# Patient Record
Sex: Male | Born: 1935 | Race: Black or African American | Hispanic: No | Marital: Married | Smoking: Former smoker
Health system: Southern US, Community
[De-identification: ages and names within clinical notes are randomized; demographics above are authoritative.]

## PROBLEM LIST (undated history)

## (undated) DIAGNOSIS — J45909 Unspecified asthma, uncomplicated: Secondary | ICD-10-CM

## (undated) DIAGNOSIS — M7541 Impingement syndrome of right shoulder: Secondary | ICD-10-CM

## (undated) DIAGNOSIS — M1990 Unspecified osteoarthritis, unspecified site: Secondary | ICD-10-CM

## (undated) DIAGNOSIS — E7800 Pure hypercholesterolemia, unspecified: Secondary | ICD-10-CM

## (undated) DIAGNOSIS — N400 Enlarged prostate without lower urinary tract symptoms: Secondary | ICD-10-CM

## (undated) DIAGNOSIS — K56609 Unspecified intestinal obstruction, unspecified as to partial versus complete obstruction: Secondary | ICD-10-CM

## (undated) DIAGNOSIS — G4733 Obstructive sleep apnea (adult) (pediatric): Secondary | ICD-10-CM

## (undated) DIAGNOSIS — J449 Chronic obstructive pulmonary disease, unspecified: Secondary | ICD-10-CM

## (undated) HISTORY — PX: PROSTATE BIOPSY: SHX241

## (undated) HISTORY — PX: COLONOSCOPY: SHX174

## (undated) HISTORY — PX: JOINT REPLACEMENT: SHX530

---

## 2005-08-15 ENCOUNTER — Ambulatory Visit: Payer: Self-pay | Admitting: Internal Medicine

## 2007-12-09 ENCOUNTER — Ambulatory Visit: Payer: Self-pay | Admitting: Internal Medicine

## 2009-03-19 ENCOUNTER — Inpatient Hospital Stay: Payer: Self-pay | Admitting: *Deleted

## 2010-01-30 ENCOUNTER — Ambulatory Visit: Payer: Self-pay | Admitting: Internal Medicine

## 2010-02-16 ENCOUNTER — Ambulatory Visit: Payer: Self-pay | Admitting: Internal Medicine

## 2010-08-13 ENCOUNTER — Ambulatory Visit: Payer: Self-pay | Admitting: Specialist

## 2010-10-20 DIAGNOSIS — Z9109 Other allergy status, other than to drugs and biological substances: Secondary | ICD-10-CM | POA: Insufficient documentation

## 2011-01-05 ENCOUNTER — Inpatient Hospital Stay: Payer: Self-pay | Admitting: Internal Medicine

## 2011-05-31 DIAGNOSIS — J449 Chronic obstructive pulmonary disease, unspecified: Secondary | ICD-10-CM | POA: Insufficient documentation

## 2011-06-12 ENCOUNTER — Emergency Department: Payer: Self-pay | Admitting: Unknown Physician Specialty

## 2011-06-12 LAB — COMPREHENSIVE METABOLIC PANEL
Albumin: 4.2 g/dL (ref 3.4–5.0)
Anion Gap: 8 (ref 7–16)
Calcium, Total: 9.4 mg/dL (ref 8.5–10.1)
Chloride: 105 mmol/L (ref 98–107)
Co2: 28 mmol/L (ref 21–32)
Creatinine: 0.86 mg/dL (ref 0.60–1.30)
EGFR (African American): >60
EGFR (Non-African Amer.): >60
Glucose: 112 mg/dL — ABNORMAL HIGH (ref 65–99)
Potassium: 4.2 mmol/L (ref 3.5–5.1)
Sodium: 141 mmol/L (ref 136–145)
Total Protein: 7.9 g/dL (ref 6.4–8.2)

## 2011-06-12 LAB — CBC
HCT: 48.6 % (ref 40.0–52.0)
HGB: 16.1 g/dL (ref 13.0–18.0)
MCV: 93 fL (ref 80–100)
Platelet: 223 10*3/uL (ref 150–440)
RDW: 13.3 % (ref 11.5–14.5)
WBC: 9.4 10*3/uL (ref 3.8–10.6)

## 2011-10-16 DIAGNOSIS — R972 Elevated prostate specific antigen [PSA]: Secondary | ICD-10-CM | POA: Insufficient documentation

## 2011-10-16 DIAGNOSIS — R350 Frequency of micturition: Secondary | ICD-10-CM | POA: Insufficient documentation

## 2011-10-16 DIAGNOSIS — R339 Retention of urine, unspecified: Secondary | ICD-10-CM | POA: Insufficient documentation

## 2011-10-16 DIAGNOSIS — N401 Enlarged prostate with lower urinary tract symptoms: Secondary | ICD-10-CM | POA: Insufficient documentation

## 2012-03-03 DIAGNOSIS — G4730 Sleep apnea, unspecified: Secondary | ICD-10-CM | POA: Insufficient documentation

## 2012-03-16 ENCOUNTER — Ambulatory Visit: Payer: Self-pay | Admitting: Unknown Physician Specialty

## 2012-03-18 LAB — PATHOLOGY REPORT

## 2012-07-14 ENCOUNTER — Encounter: Payer: Self-pay | Admitting: Specialist

## 2012-07-28 ENCOUNTER — Encounter: Payer: Self-pay | Admitting: Specialist

## 2013-07-29 LAB — COMPREHENSIVE METABOLIC PANEL
Albumin: 3.8 g/dL (ref 3.4–5.0)
Alkaline Phosphatase: 69 U/L
Anion Gap: 6 — ABNORMAL LOW (ref 7–16)
BUN: 17 mg/dL (ref 7–18)
Bilirubin,Total: 0.7 mg/dL (ref 0.2–1.0)
CO2: 27 mmol/L (ref 21–32)
CREATININE: 1.04 mg/dL (ref 0.60–1.30)
Calcium, Total: 9.3 mg/dL (ref 8.5–10.1)
Chloride: 105 mmol/L (ref 98–107)
EGFR (African American): >60
Glucose: 149 mg/dL — ABNORMAL HIGH (ref 65–99)
Osmolality: 280 (ref 275–301)
Potassium: 3.9 mmol/L (ref 3.5–5.1)
SGOT(AST): 28 U/L (ref 15–37)
SGPT (ALT): 28 U/L (ref 12–78)
Sodium: 138 mmol/L (ref 136–145)
Total Protein: 7.2 g/dL (ref 6.4–8.2)

## 2013-07-29 LAB — CBC
HCT: 49 % (ref 40.0–52.0)
HGB: 16.5 g/dL (ref 13.0–18.0)
MCH: 31.4 pg (ref 26.0–34.0)
MCHC: 33.6 g/dL (ref 32.0–36.0)
MCV: 94 fL (ref 80–100)
Platelet: 173 10*3/uL (ref 150–440)
RBC: 5.24 10*6/uL (ref 4.40–5.90)
RDW: 13.3 % (ref 11.5–14.5)
WBC: 12.7 10*3/uL — ABNORMAL HIGH (ref 3.8–10.6)

## 2013-07-29 LAB — TROPONIN I

## 2013-07-29 LAB — PRO B NATRIURETIC PEPTIDE: B-Type Natriuretic Peptide: 31 pg/mL (ref 0–450)

## 2013-07-30 ENCOUNTER — Inpatient Hospital Stay: Payer: Self-pay | Admitting: Internal Medicine

## 2013-07-31 LAB — CBC WITH DIFFERENTIAL/PLATELET
BASOS PCT: 0 %
Basophil #: 0 10*3/uL (ref 0.0–0.1)
EOS PCT: 0 %
Eosinophil #: 0 10*3/uL (ref 0.0–0.7)
HCT: 44.6 % (ref 40.0–52.0)
HGB: 14.4 g/dL (ref 13.0–18.0)
LYMPHS ABS: 0.8 10*3/uL — AB (ref 1.0–3.6)
Lymphocyte %: 4.6 %
MCH: 30.5 pg (ref 26.0–34.0)
MCHC: 32.4 g/dL (ref 32.0–36.0)
MCV: 94 fL (ref 80–100)
Monocyte #: 0.8 x10 3/mm (ref 0.2–1.0)
Monocyte %: 4.2 %
NEUTROS ABS: 16.5 10*3/uL — AB (ref 1.4–6.5)
NEUTROS PCT: 91.2 %
Platelet: 196 10*3/uL (ref 150–440)
RBC: 4.74 10*6/uL (ref 4.40–5.90)
RDW: 13.3 % (ref 11.5–14.5)
WBC: 18.1 10*3/uL — AB (ref 3.8–10.6)

## 2013-07-31 LAB — LIPID PANEL
Cholesterol: 140 mg/dL (ref 0–200)
HDL: 59 mg/dL (ref 40–60)
LDL CHOLESTEROL, CALC: 67 mg/dL (ref 0–100)
TRIGLYCERIDES: 71 mg/dL (ref 0–200)
VLDL CHOLESTEROL, CALC: 14 mg/dL (ref 5–40)

## 2013-07-31 LAB — BASIC METABOLIC PANEL
Anion Gap: 7 (ref 7–16)
BUN: 18 mg/dL (ref 7–18)
CHLORIDE: 106 mmol/L (ref 98–107)
Calcium, Total: 9.1 mg/dL (ref 8.5–10.1)
Co2: 25 mmol/L (ref 21–32)
Creatinine: 0.98 mg/dL (ref 0.60–1.30)
EGFR (African American): >60
EGFR (Non-African Amer.): >60
Glucose: 149 mg/dL — ABNORMAL HIGH (ref 65–99)
OSMOLALITY: 280 (ref 275–301)
Potassium: 4.4 mmol/L (ref 3.5–5.1)
Sodium: 138 mmol/L (ref 136–145)

## 2013-08-02 ENCOUNTER — Emergency Department: Payer: Self-pay | Admitting: Emergency Medicine

## 2013-08-02 LAB — CBC
HCT: 47.8 % (ref 40.0–52.0)
HGB: 15.3 g/dL (ref 13.0–18.0)
MCH: 30.1 pg (ref 26.0–34.0)
MCHC: 32.1 g/dL (ref 32.0–36.0)
MCV: 94 fL (ref 80–100)
PLATELETS: 213 10*3/uL (ref 150–440)
RBC: 5.1 10*6/uL (ref 4.40–5.90)
RDW: 13 % (ref 11.5–14.5)
WBC: 12.8 10*3/uL — ABNORMAL HIGH (ref 3.8–10.6)

## 2013-08-02 LAB — BASIC METABOLIC PANEL
ANION GAP: 8 (ref 7–16)
BUN: 19 mg/dL — ABNORMAL HIGH (ref 7–18)
CREATININE: 1.08 mg/dL (ref 0.60–1.30)
Calcium, Total: 8.7 mg/dL (ref 8.5–10.1)
Chloride: 100 mmol/L (ref 98–107)
Co2: 27 mmol/L (ref 21–32)
GLUCOSE: 123 mg/dL — AB (ref 65–99)
Osmolality: 274 (ref 275–301)
Potassium: 4 mmol/L (ref 3.5–5.1)
SODIUM: 135 mmol/L — AB (ref 136–145)

## 2013-08-02 LAB — HEPATIC FUNCTION PANEL A (ARMC)
ALBUMIN: 3.7 g/dL (ref 3.4–5.0)
Alkaline Phosphatase: 58 U/L
BILIRUBIN TOTAL: 0.6 mg/dL (ref 0.2–1.0)
Bilirubin, Direct: 0.1 mg/dL (ref 0.00–0.20)
SGOT(AST): 32 U/L (ref 15–37)
SGPT (ALT): 39 U/L (ref 12–78)
TOTAL PROTEIN: 7.1 g/dL (ref 6.4–8.2)

## 2013-08-02 LAB — TROPONIN I: Troponin-I: <0.02 ng/mL

## 2013-08-02 LAB — PRO B NATRIURETIC PEPTIDE: B-Type Natriuretic Peptide: 691 pg/mL — ABNORMAL HIGH (ref 0–450)

## 2014-03-30 DIAGNOSIS — E7800 Pure hypercholesterolemia, unspecified: Secondary | ICD-10-CM | POA: Insufficient documentation

## 2014-03-30 DIAGNOSIS — N401 Enlarged prostate with lower urinary tract symptoms: Secondary | ICD-10-CM | POA: Insufficient documentation

## 2014-04-18 ENCOUNTER — Emergency Department: Payer: Self-pay | Admitting: Emergency Medicine

## 2014-04-29 ENCOUNTER — Ambulatory Visit: Payer: Self-pay

## 2014-05-13 DIAGNOSIS — M7541 Impingement syndrome of right shoulder: Secondary | ICD-10-CM | POA: Insufficient documentation

## 2014-05-21 NOTE — H&P (Signed)
PATIENT NAME:  Kyle Wells, [name] MR#:  [id number] DATE OF BIRTH:  10/09/1933  DATE OF ADMISSION:  07/30/2013  PRIMARY CARE PHYSICIAN:  Dr. Yates DecampJohn Walker.   REFERRING PHYSICIAN:  Dr. Margarita GrizzleWoodruff.   CHIEF COMPLAINT:  Shortness of breath.   HISTORY OF PRESENT ILLNESS:  The patient is a 81 year old male with a past medical history of COPD, obstructive sleep apnea, lives on CPAP, still smokes cigarettes, is presenting to the ED with a chief complaint of two day history of shortness of breath.  The patient tried nebulizer treatments at home with no significant improvement.  He was wheezing and could not breathe tonight and came into the ED.  The patient was given by mouth prednisone and levofloxacin and some nebulizer treatments with no significant improvement.  Hospitalist team is called to admit the patient.  Solu-Medrol 125 mg was ordered and during my examination the patient's shortness of breath is slightly better and resting.  His daughter and son were at bedside.  Denies any fevers.  No sick contacts.  No other complaints.  He is complaining of some dry cough, nothing unusual.   PAST MEDICAL HISTORY:  Chronic history of COPD, hyperlipidemia, morbid obesity, tobacco use, obstructive sleep apnea, uses CPAP at bedtime.   PAST SURGICAL HISTORY:  None.   ALLERGIES:  No known drug allergies.   PSYCHOSOCIAL HISTORY:  Lives with wife.  Smokes one to two cigarettes per day.  Denies alcohol or illicit drug usage.  FAMILY HISTORY:  Father deceased at age 79 with heart attack.   HOME MEDICATIONS:  Tylenol extra strength 2 tablets every six hours as needed, Spiriva 18 mcg inhalation once daily, ProAir 2 puff inhalation every four hours as needed, Flonase one spray each nostril once daily, fenestrate 5 mg once daily, Advair 500/50 inhalation 2 times a day.   REVIEW OF SYSTEMS:  CONSTITUTIONAL:  Denies any fever, fatigue.  EYES:  Denies blurry vision, double vision.  EARS, NOSE, THROAT:  Denies epistaxis,  discharge or tinnitus.  RESPIRATION:  Intermittent episodes of cough, complaining of wheezing and shortness of breath.  Denies any pneumonia or asthma.  Has chronic history of COPD.  CARDIOVASCULAR:  No chest pain, palpitations, syncope.  GASTROINTESTINAL:  Denies nausea, vomiting, diarrhea, abdominal pain, hematemesis, or melena.  GENITOURINARY:  No dysuria, hematuria.   ENDOCRINE:  Denies polyuria, nocturia, thyroid problems.  He has chronic hyperlipidemia.  HEMATOLOGIC AND LYMPHATIC:  No anemia, easy bruising, bleeding.  INTEGUMENTARY:  No acne, rash, lesions.  MUSCULOSKELETAL:  No joint pain in the neck and back.  Denies any shoulder pain.  Denies any gout.  NEUROLOGIC:  Denies vertigo, ataxia, dementia.  PSYCHIATRIC:  No ADD, OCD.  Denies any bipolar disorder.   PHYSICAL EXAMINATION:  VITAL SIGNS:  Temperature afebrile, pulse 76, respirations 22, blood pressure 136/82, pulse ox is 97% on 2 liters.  GENERAL APPEARANCE:  Not under acute distress.  Moderately built and nourished.  HEENT:  Normocephalic, atraumatic.  Pupils are equally reacting to light and accommodation.  No scleral icterus.  No conjunctival injection.  No sinus tenderness.  No postnasal drip.  Moist mucous membranes.  NECK:  Supple.  No JVD.  No thyromegaly.  Range of motion is intact.  LUNGS:  Moderate air entry.  End expiratory wheezing is present.  No accessory muscle usage.  CARDIAC:  S1 and S2 normal.  Regular rate and rhythm.  No anterior chest wall tenderness on palpation.  No peripheral edema.  GASTROINTESTINAL:  Soft.  Bowel sounds  are positive in all four quadrants.  Nontender, nondistended.  No hepatosplenomegaly.  No masses felt.  NEUROLOGIC:  Awake, alert, and oriented x 3.  Cranial nerves II through XII are grossly intact.  Motor and sensory are intact.  Reflexes are 2+.  EXTREMITIES:  No edema.  No cyanosis.  No clubbing.  SKIN:  Warm to touch.  Normal turgor.  No rashes.  No lesions.  MUSCULOSKELETAL:  No  joint effusion, tenderness, erythema.  PSYCHIATRIC:  Normal mood and affect.   LABORATORY AND IMAGING STUDIES:  LFTs are normal.  Troponin less than 0.02.  CBC:  WBC is 12.7.  The rest of the CBC is normal.  BMP:  Glucose is at 149, anion gap is at 6.  The rest of the BMP is normal.  Chest x-ray, portable:  No evidence of acute cardiopulmonary disease.  A 12-lead EKG, normal sinus rhythm at 78 beats per minute.  No acute ST-T wave changes.   ASSESSMENT AND PLAN:  A 81 year old male presenting to the Emergency Department with a chief complaint of shortness of breath for two days associated with wheezing and some dry cough, will be admitted with the following assessment and plan.  1.  Acute respiratory distress from acute exacerbation of chronic obstructive pulmonary disease.  We will admit him to telemetry.  We will provide him intravenous Solu-Medrol 60 mg q. 6 hours, nebulizer treatments and intravenous levofloxacin.  2.  Nicotine dependence.  The patient is encouraged to quit smoking.  3.  Obstructive sleep apnea.  CPAP at bedtime.  4.  Hyperlipidemia.  Continue his home medications.  5.  Benign prostatic hypertrophy.  Continue finasteride.  6.  Provide gastrointestinal and deep vein thrombosis prophylaxis.   Plan of care discussed with the patient and his son and daughter at bedside.  They all verbalized understanding of the plan.   CODE STATUS:  HE IS A FULL CODE.  Daughter is the medical power of attorney.    The patient will be transferred to Dr. Yates Decamp in the a.m.   Total time spent on the admission is 50 minutes.    ____________________________ Ramonita Lab, MD ag:ea D: 07/30/2013 00:38:01 ET T: 07/30/2013 01:50:20 ET JOB#: [id number]  cc: Ramonita Lab, MD, <Dictator> Ramonita Lab MD ELECTRONICALLY SIGNED 07/31/2013 0:42

## 2014-09-18 ENCOUNTER — Emergency Department: Payer: Medicare Other

## 2014-09-18 ENCOUNTER — Encounter: Payer: Self-pay | Admitting: Emergency Medicine

## 2014-09-18 ENCOUNTER — Inpatient Hospital Stay
Admission: EM | Admit: 2014-09-18 | Discharge: 2014-09-21 | DRG: 390 | Disposition: A | Discharge status: Home / Self Care | Payer: Medicare Other | Attending: Surgery | Admitting: Surgery

## 2014-09-18 DIAGNOSIS — F17210 Nicotine dependence, cigarettes, uncomplicated: Secondary | ICD-10-CM | POA: Diagnosis present

## 2014-09-18 DIAGNOSIS — J45909 Unspecified asthma, uncomplicated: Secondary | ICD-10-CM | POA: Diagnosis present

## 2014-09-18 DIAGNOSIS — K5660 Unspecified intestinal obstruction: Secondary | ICD-10-CM | POA: Diagnosis not present

## 2014-09-18 DIAGNOSIS — K567 Ileus, unspecified: Secondary | ICD-10-CM | POA: Diagnosis present

## 2014-09-18 DIAGNOSIS — K56609 Unspecified intestinal obstruction, unspecified as to partial versus complete obstruction: Secondary | ICD-10-CM | POA: Insufficient documentation

## 2014-09-18 DIAGNOSIS — M1990 Unspecified osteoarthritis, unspecified site: Secondary | ICD-10-CM | POA: Diagnosis present

## 2014-09-18 DIAGNOSIS — J449 Chronic obstructive pulmonary disease, unspecified: Secondary | ICD-10-CM | POA: Insufficient documentation

## 2014-09-18 DIAGNOSIS — K5730 Diverticulosis of large intestine without perforation or abscess without bleeding: Secondary | ICD-10-CM | POA: Diagnosis present

## 2014-09-18 HISTORY — DX: Chronic obstructive pulmonary disease, unspecified: J44.9

## 2014-09-18 HISTORY — DX: Unspecified osteoarthritis, unspecified site: M19.90

## 2014-09-18 HISTORY — DX: Unspecified asthma, uncomplicated: J45.909

## 2014-09-18 LAB — CBC
HCT: 47.9 % (ref 40.0–52.0)
Hemoglobin: 15.9 g/dL (ref 13.0–18.0)
MCH: 30.1 pg (ref 26.0–34.0)
MCHC: 33.1 g/dL (ref 32.0–36.0)
MCV: 90.9 fL (ref 80.0–100.0)
PLATELETS: 203 10*3/uL (ref 150–440)
RBC: 5.27 MIL/uL (ref 4.40–5.90)
RDW: 13 % (ref 11.5–14.5)
WBC: 11.7 10*3/uL — AB (ref 3.8–10.6)

## 2014-09-18 LAB — COMPREHENSIVE METABOLIC PANEL
ALBUMIN: 3.9 g/dL (ref 3.5–5.0)
ALT: 19 U/L (ref 17–63)
AST: 31 U/L (ref 15–41)
Alkaline Phosphatase: 63 U/L (ref 38–126)
Anion gap: 9 (ref 5–15)
BUN: 17 mg/dL (ref 6–20)
CHLORIDE: 104 mmol/L (ref 101–111)
CO2: 25 mmol/L (ref 22–32)
Calcium: 9.3 mg/dL (ref 8.9–10.3)
Creatinine, Ser: 0.87 mg/dL (ref 0.61–1.24)
GFR calc Af Amer: >60 mL/min (ref >60)
GFR calc non Af Amer: >60 mL/min (ref >60)
GLUCOSE: 144 mg/dL — AB (ref 65–99)
POTASSIUM: 4 mmol/L (ref 3.5–5.1)
SODIUM: 138 mmol/L (ref 135–145)
Total Bilirubin: 0.5 mg/dL (ref 0.3–1.2)
Total Protein: 7.1 g/dL (ref 6.5–8.1)

## 2014-09-18 LAB — BRAIN NATRIURETIC PEPTIDE: B Natriuretic Peptide: 63 pg/mL (ref 0.0–100.0)

## 2014-09-18 LAB — LIPASE, BLOOD: LIPASE: 15 U/L — AB (ref 22–51)

## 2014-09-18 LAB — TROPONIN I

## 2014-09-18 MED ORDER — PREDNISONE 20 MG PO TABS
40.0000 mg | ORAL_TABLET | Freq: Once | ORAL | Status: AC
  Administered 2014-09-18: 40 mg via ORAL
  Filled 2014-09-18: qty 2

## 2014-09-18 MED ORDER — IOHEXOL 240 MG/ML SOLN
25.0000 mL | Freq: Once | INTRAMUSCULAR | Status: AC | PRN
  Administered 2014-09-18: 25 mL via ORAL

## 2014-09-18 MED ORDER — IPRATROPIUM-ALBUTEROL 0.5-2.5 (3) MG/3ML IN SOLN
3.0000 mL | Freq: Once | RESPIRATORY_TRACT | Status: AC
  Administered 2014-09-18: 3 mL via RESPIRATORY_TRACT
  Filled 2014-09-18: qty 3

## 2014-09-18 MED ORDER — IOHEXOL 350 MG/ML SOLN
100.0000 mL | Freq: Once | INTRAVENOUS | Status: AC | PRN
  Administered 2014-09-18: 100 mL via INTRAVENOUS

## 2014-09-18 NOTE — ED Notes (Addendum)
Pt c/o "upset stomach" since Friday; denies N/V/D; normal BM today; shortness of breath started today; history of COPD but says he feels more SOB than normal for him; pt denies cough/cold symptoms; pt wears oxygen but only at night with CPAP when he's sleeping; talking in complete coherent' pt says he uses his home nebulizer every 4-5 hours but says he didn't use it today because he didn't feel like it; then says he did use it maybe once or twice; pt's oxygen 90% in triage; usually it's 93-95% on room air at home

## 2014-09-18 NOTE — ED Notes (Signed)
Pt states abdominal pain started after Friday. Unable to eat but just a few small meals since due to abdominal pain says it feels like things are "rolling around."

## 2014-09-18 NOTE — ED Notes (Signed)
CT notified patient finished drinking contrast. 

## 2014-09-18 NOTE — ED Notes (Signed)
Patient transported to X-ray 

## 2014-09-18 NOTE — ED Provider Notes (Signed)
[facility] Emergency Department Provider Note  ____________________________________________  Time seen: Approximately 8:22 PM  I have reviewed the triage vital signs and the nursing notes.   HISTORY  Chief Complaint Shortness of Breath and Abdominal Pain    HPI Kyle Wells is a 81 y.o. male reports that since about Friday he's been feeling a little "full" and bloated in his stomach. Still eating, not throwing up. No diarrhea. Still having normal bowel movements. No fevers or chills. In addition today he states that he started to feel just a little short of breath. States that this feels kind of typical with his COPD but slightly worse than normal with a little bit of wheezing.  He denies having a productive cough. States he has a nebulizer at home, used it once today and it helped a little bit. Comes ER for evaluation of his mild shortness of breath.  He reports that he has not had any weight gain or swelling in his legs. Denies a history of heart failure "fluid on lungs". I have a chest pain.   Past Medical History  Diagnosis Date  . COPD (chronic obstructive pulmonary disease)   . Arthritis   . Asthma     There are no active problems to display for this patient.   History reviewed. No pertinent past surgical history.  Current Outpatient Rx  Name  Route  Sig  Dispense  Refill  . ADVAIR DISKUS 500-50 MCG/DOSE AEPB   Inhalation   Inhale 2 puffs into the lungs daily.           Dispense as written.   Marland Kitchen albuterol (PROVENTIL) (2.5 MG/3ML) 0.083% nebulizer solution   Inhalation   Inhale 3 mLs into the lungs every 6 (six) hours.         . finasteride (PROSCAR) 5 MG tablet   Oral   Take 1 tablet by mouth daily.      3   . montelukast (SINGULAIR) 10 MG tablet   Oral   Take 1 tablet by mouth daily.         . Multiple Vitamins-Minerals (MULTIVITAMIN & MINERAL PO)   Oral   Take 1 tablet by mouth daily.         Marland Kitchen PROAIR HFA 108 (90  BASE) MCG/ACT inhaler   Inhalation   Inhale 2 puffs into the lungs 2 (two) times daily.           Dispense as written.   Marland Kitchen SPIRIVA HANDIHALER 18 MCG inhalation capsule   Inhalation   Place 2 puffs into inhaler and inhale daily.           Dispense as written.     Allergies Review of patient's allergies indicates no known allergies.  History reviewed. No pertinent family history.  Social History Social History  Substance Use Topics  . Smoking status: Current Some Day Smoker    Types: Cigarettes  . Smokeless tobacco: Never Used  . Alcohol Use: No    Review of Systems Constitutional: No fever/chills Eyes: No visual changes. ENT: No sore throat. Cardiovascular: Denies chest pain. Respiratory: See history of present illness Gastrointestinal: Feeling of fullness, but no pain.  No nausea, no vomiting.  No diarrhea.  No constipation. Genitourinary: Negative for dysuria. Musculoskeletal: Negative for back pain. Skin: Negative for rash. Neurological: Negative for headaches, focal weakness or numbness.  10-point ROS otherwise negative.  ____________________________________________   PHYSICAL EXAM:  VITAL SIGNS: ED Triage Vitals  Enc Vitals Group  BP 09/18/14 2005 114/72 mmHg     Pulse Rate 09/18/14 2005 103     Resp 09/18/14 2005 26     Temp 09/18/14 2005 98.1 F (36.7 C)     Temp Source 09/18/14 2005 Oral     SpO2 09/18/14 2005 90 %     Weight 09/18/14 2005 210 lb (95.255 kg)     Height 09/18/14 2005 5\' 7"  (1.702 m)     Head Cir --      Peak Flow --      Pain Score 09/18/14 2008 0     Pain Loc --      Pain Edu? --      Excl. in GC? --     Constitutional: Alert and oriented. Well appearing and in no acute distress. Patient's oxygen saturation noted at 93% on room air. He states that normally his oxygen level somewhere between 92-95% at home. Presently states he is feeling okay. Eyes: Conjunctivae are normal. PERRL. EOMI. Head: Atraumatic. Nose: No  congestion/rhinnorhea. Mouth/Throat: Mucous membranes are moist.  Oropharynx non-erythematous. Neck: No stridor.   Cardiovascular: Normal rate, regular rhythm. Grossly normal heart sounds.  Good peripheral circulation. Respiratory: Normal respiratory effort.  No retractions. Lungs CTAB supportive and a very minimal amount of wheezing and expiratory. Slightly diminished in the bases bilaterally. Gastrointestinal: Soft and nontender. No distention. No abdominal bruits. No CVA tenderness. Musculoskeletal: No lower extremity tenderness nor edema.  No joint effusions. Neurologic:  Normal speech and language. No gross focal neurologic deficits are appreciated. No gait instability. Skin:  Skin is warm, dry and intact. No rash noted. Psychiatric: Mood and affect are normal. Speech and behavior are normal.  ____________________________________________   LABS (all labs ordered are listed, but only abnormal results are displayed)  Labs Reviewed  CBC - Abnormal; Notable for the following:    WBC 11.7 (*)    All other components within normal limits  COMPREHENSIVE METABOLIC PANEL - Abnormal; Notable for the following:    Glucose, Bld 144 (*)    All other components within normal limits  LIPASE, BLOOD - Abnormal; Notable for the following:    Lipase 15 (*)    All other components within normal limits  TROPONIN I  BRAIN NATRIURETIC PEPTIDE   ____________________________________________  EKG  Reviewed and interpreted by me Sinus tachycardia Ventricular rate 100 PR 158 QRS 88 QTc 435 No acute ST abnormalities. No ischemic changes. ____________________________________________  RADIOLOGY  IMPRESSION: Changes of small bowel obstruction with transition zone in the right lower quadrant. Possible internal hernia versus adhesions. No evidence of bowel ischemia. Prostate gland is enlarged. ____________________________________________   PROCEDURES  Procedure(s) performed: None  Critical  Care performed: No  ____________________________________________   INITIAL IMPRESSION / ASSESSMENT AND PLAN / ED COURSE  Pertinent labs & imaging results that were available during my care of the patient were reviewed by me and considered in my medical decision making (see chart for details).  Patient presents with mild dyspnea and feeling of being bloated. He appears in no distress, and his oxygen level is currently at his reported baseline. He does have long-standing COPD, but given his slight increase in shortness of breath I suspect he may be having a mild COPD exacerbation. His EKG is quite reassuring without signs of ischemia. Diarrhea chest pain. His abdomen is soft nontender and nondistended to my evaluation. There is no JVD, there is no lower extremity edema. No evidence of congestive heart failure by exam. We'll obtain x-rays of his  abdomen and chest, I'll give him a nebulizer treatment, we'll obtain basic labs including cardiac enzymes given his age and risk factors. All also had a BNP.  ----------------------------------------- 12:06 AM on 09/19/2014 -----------------------------------------  Discussed with Dr. Juliann Pulse, he will admit the patient. Advises to place NG tube. Discussed with the patient, patient agreeable with NG tube placement. We'll admit him for ongoing care of the general surgery service. ____________________________________________   FINAL CLINICAL IMPRESSION(S) / ED DIAGNOSES  Final diagnoses:  Small bowel obstruction  COPD, mild      Sharyn Creamer, MD 09/19/14 [phone]

## 2014-09-19 ENCOUNTER — Emergency Department: Payer: Medicare Other

## 2014-09-19 ENCOUNTER — Inpatient Hospital Stay: Payer: Medicare Other

## 2014-09-19 DIAGNOSIS — K5660 Unspecified intestinal obstruction: Secondary | ICD-10-CM | POA: Diagnosis present

## 2014-09-19 DIAGNOSIS — K5730 Diverticulosis of large intestine without perforation or abscess without bleeding: Secondary | ICD-10-CM | POA: Diagnosis present

## 2014-09-19 DIAGNOSIS — J45909 Unspecified asthma, uncomplicated: Secondary | ICD-10-CM | POA: Diagnosis present

## 2014-09-19 DIAGNOSIS — F17210 Nicotine dependence, cigarettes, uncomplicated: Secondary | ICD-10-CM | POA: Diagnosis present

## 2014-09-19 DIAGNOSIS — K567 Ileus, unspecified: Secondary | ICD-10-CM | POA: Diagnosis present

## 2014-09-19 DIAGNOSIS — M1990 Unspecified osteoarthritis, unspecified site: Secondary | ICD-10-CM | POA: Diagnosis present

## 2014-09-19 DIAGNOSIS — J449 Chronic obstructive pulmonary disease, unspecified: Secondary | ICD-10-CM | POA: Diagnosis present

## 2014-09-19 DIAGNOSIS — K5669 Other intestinal obstruction: Secondary | ICD-10-CM | POA: Diagnosis not present

## 2014-09-19 LAB — CBC
HEMATOCRIT: 45.1 % (ref 40.0–52.0)
HEMOGLOBIN: 15.4 g/dL (ref 13.0–18.0)
MCH: 31.1 pg (ref 26.0–34.0)
MCHC: 34.1 g/dL (ref 32.0–36.0)
MCV: 91.3 fL (ref 80.0–100.0)
Platelets: 198 10*3/uL (ref 150–440)
RBC: 4.94 MIL/uL (ref 4.40–5.90)
RDW: 12.8 % (ref 11.5–14.5)
WBC: 10 10*3/uL (ref 3.8–10.6)

## 2014-09-19 LAB — COMPREHENSIVE METABOLIC PANEL
ALBUMIN: 3.7 g/dL (ref 3.5–5.0)
ALK PHOS: 59 U/L (ref 38–126)
ALT: 19 U/L (ref 17–63)
ANION GAP: 8 (ref 5–15)
AST: 24 U/L (ref 15–41)
BUN: 16 mg/dL (ref 6–20)
CALCIUM: 8.9 mg/dL (ref 8.9–10.3)
CO2: 26 mmol/L (ref 22–32)
Chloride: 104 mmol/L (ref 101–111)
Creatinine, Ser: 0.89 mg/dL (ref 0.61–1.24)
GFR calc Af Amer: >60 mL/min (ref >60)
GFR calc non Af Amer: >60 mL/min (ref >60)
GLUCOSE: 150 mg/dL — AB (ref 65–99)
POTASSIUM: 4.1 mmol/L (ref 3.5–5.1)
SODIUM: 138 mmol/L (ref 135–145)
Total Bilirubin: 0.8 mg/dL (ref 0.3–1.2)
Total Protein: 6.9 g/dL (ref 6.5–8.1)

## 2014-09-19 MED ORDER — MOMETASONE FURO-FORMOTEROL FUM 200-5 MCG/ACT IN AERO
2.0000 | INHALATION_SPRAY | Freq: Two times a day (BID) | RESPIRATORY_TRACT | Status: DC
  Administered 2014-09-19 – 2014-09-20 (×4): 2 via RESPIRATORY_TRACT
  Filled 2014-09-19: qty 8.8

## 2014-09-19 MED ORDER — ACETAMINOPHEN 650 MG RE SUPP
650.0000 mg | Freq: Four times a day (QID) | RECTAL | Status: DC | PRN
  Administered 2014-09-19: 650 mg via RECTAL
  Filled 2014-09-19: qty 1

## 2014-09-19 MED ORDER — TIOTROPIUM BROMIDE MONOHYDRATE 18 MCG IN CAPS
18.0000 ug | ORAL_CAPSULE | Freq: Every day | RESPIRATORY_TRACT | Status: DC
  Administered 2014-09-19 – 2014-09-20 (×2): 18 ug via RESPIRATORY_TRACT
  Filled 2014-09-19: qty 5

## 2014-09-19 MED ORDER — SODIUM CHLORIDE 0.9 % IV SOLN
INTRAVENOUS | Status: DC
  Administered 2014-09-19: 1000 mL via INTRAVENOUS
  Administered 2014-09-19 (×2): via INTRAVENOUS
  Administered 2014-09-20: 1000 mL via INTRAVENOUS

## 2014-09-19 MED ORDER — BUTAMBEN-TETRACAINE-BENZOCAINE 2-2-14 % EX AERO
INHALATION_SPRAY | CUTANEOUS | Status: AC
  Administered 2014-09-19: 1 via TOPICAL
  Filled 2014-09-19: qty 20

## 2014-09-19 MED ORDER — ALBUTEROL SULFATE (2.5 MG/3ML) 0.083% IN NEBU
3.0000 mL | INHALATION_SOLUTION | Freq: Four times a day (QID) | RESPIRATORY_TRACT | Status: DC
  Administered 2014-09-19 – 2014-09-20 (×5): 3 mL via RESPIRATORY_TRACT
  Filled 2014-09-19 (×5): qty 3

## 2014-09-19 MED ORDER — ONDANSETRON HCL 4 MG/2ML IJ SOLN: 4.0000 mg | Freq: Four times a day (QID) | INTRAMUSCULAR | Status: DC | PRN

## 2014-09-19 MED ORDER — HEPARIN SODIUM (PORCINE) 5000 UNIT/ML IJ SOLN
5000.0000 [IU] | Freq: Three times a day (TID) | INTRAMUSCULAR | Status: DC
  Administered 2014-09-19 – 2014-09-21 (×7): 5000 [IU] via SUBCUTANEOUS
  Filled 2014-09-19 (×7): qty 1

## 2014-09-19 MED ORDER — ONDANSETRON 4 MG PO TBDP: 4.0000 mg | ORAL_TABLET | Freq: Four times a day (QID) | ORAL | Status: DC | PRN

## 2014-09-19 MED ORDER — FINASTERIDE 5 MG PO TABS
5.0000 mg | ORAL_TABLET | Freq: Every day | ORAL | Status: DC
  Administered 2014-09-19 – 2014-09-20 (×2): 5 mg via ORAL
  Filled 2014-09-19 (×2): qty 1

## 2014-09-19 MED ORDER — BUTAMBEN-TETRACAINE-BENZOCAINE 2-2-14 % EX AERO
1.0000 | INHALATION_SPRAY | Freq: Once | CUTANEOUS | Status: AC
  Administered 2014-09-19: 1 via TOPICAL

## 2014-09-19 MED ORDER — PANTOPRAZOLE SODIUM 40 MG IV SOLR
40.0000 mg | Freq: Every day | INTRAVENOUS | Status: DC
  Administered 2014-09-19 – 2014-09-20 (×2): 40 mg via INTRAVENOUS
  Filled 2014-09-19 (×2): qty 40

## 2014-09-19 MED ORDER — ACETAMINOPHEN 325 MG PO TABS: 650.0000 mg | ORAL_TABLET | Freq: Four times a day (QID) | ORAL | Status: DC | PRN

## 2014-09-19 MED ORDER — HYDROMORPHONE HCL 1 MG/ML IJ SOLN
0.5000 mg | INTRAMUSCULAR | Status: DC | PRN
  Administered 2014-09-20: 0.5 mg via INTRAVENOUS
  Filled 2014-09-19: qty 1

## 2014-09-19 MED ORDER — ALBUTEROL SULFATE HFA 108 (90 BASE) MCG/ACT IN AERS: 2.0000 | INHALATION_SPRAY | Freq: Two times a day (BID) | RESPIRATORY_TRACT | Status: DC

## 2014-09-19 NOTE — H&P (Signed)
General Surgery History and Physical  CC: Abdominal bloating  HPI: Mr. Kyle Wells is a pleasant 81 yo M with a history of COPD who presents with approx 2 days of increased bloating.  Actually felt lesser bloating symptoms for weeks.  No nausea/vomiting. Last BM while in ER.  + chills.  No chest pain, shortness of breath, cough, abdominal pain, dysuria/hematuria.  Active Ambulatory Problems    Diagnosis Date Noted  . No Active Ambulatory Problems   Resolved Ambulatory Problems    Diagnosis Date Noted  . No Resolved Ambulatory Problems   Past Medical History  Diagnosis Date  . COPD (chronic obstructive pulmonary disease)   . Arthritis   . Asthma   History reviewed. No pertinent past surgical history.     Medication List    ASK your doctor about these medications        ADVAIR DISKUS 500-50 MCG/DOSE Aepb  Generic drug:  Fluticasone-Salmeterol  Inhale 2 puffs into the lungs daily.     finasteride 5 MG tablet  Commonly known as:  PROSCAR  Take 1 tablet by mouth daily.     montelukast 10 MG tablet  Commonly known as:  SINGULAIR  Take 1 tablet by mouth daily.     MULTIVITAMIN & MINERAL PO  Take 1 tablet by mouth daily.     albuterol (2.5 MG/3ML) 0.083% nebulizer solution  Commonly known as:  PROVENTIL  Inhale 3 mLs into the lungs every 6 (six) hours.     PROAIR HFA 108 (90 BASE) MCG/ACT inhaler  Generic drug:  albuterol  Inhale 2 puffs into the lungs 2 (two) times daily.     SPIRIVA HANDIHALER 18 MCG inhalation capsule  Generic drug:  tiotropium  Place 2 puffs into inhaler and inhale daily.        No Known Allergies   Social History   Social History  . Marital Status: Married    Spouse Name: N/A  . Number of Children: N/A  . Years of Education: N/A   Occupational History  . Not on file.   Social History Main Topics  . Smoking status: Current Some Day Smoker    Types: Cigarettes  . Smokeless tobacco: Never Used  . Alcohol Use: No  . Drug Use: No  .  Sexual Activity: Not on file   Other Topics Concern  . Not on file   Social History Narrative  . No narrative on file   History reviewed. No pertinent family history.   ROS: Full ROS obtained, pertinent positives and negatives as above  Blood pressure 131/75, pulse 82, temperature 98.1 F (36.7 C), temperature source Oral, resp. rate 18, height  (1.702 m), weight 210 lb (95.255 kg), SpO2 93 %. GEN: NAD/A&Ox3 FACE: no obvious facial trauma, normal external nose, normal external ears EYES: no scleral icterus, no conjunctivitis HEAD: normocephalic atraumatic CV: RRR, no MRG RESP: moving air well, lungs clear ABD: soft, nontender, moderately distended EXT: moving all ext well, strength 5/5 NEURO: cnII-XII grossly intact, sensation intact all 4 ext  Labs: All labs reviewed, significant for  WBC 11.7  Imaging: CT personally reviewed  Mid small bowel with dilation and proximally with contrast.  Normal Stomach, normal proximal and distal small bowel.  Distal small bowel without contrast.  A/P 81 yo who presents with abdominal distention.  CT shows dilated mid small bowel with proximal and distal collapse.  Strange presentation for SBO and has no history of prior surgery.  ? Internal hernia.  VSS, no  pain, normal labs.  Treat as SBO with NG decompression, IVF, KUB in am to eval contrast progression.

## 2014-09-19 NOTE — Progress Notes (Signed)
CC: Small bowel obstruction Subjective: This a patient admitted with a diagnosis of small bowel obstruction he has not had any previous surgeries but has had previous episodes like this. He states today he feels much better he has no abdominal pain no nausea or vomiting and nasogastric tube is been placed he is not passing any gas or bowel movement yet denies fevers or chills.  Objective: Vital signs in last 24 hours: Temp:  [97.6 F (36.4 C)-98.3 F (36.8 C)] 97.6 F (36.4 C) (08/22 0750) Pulse Rate:  [70-103] 74 (08/22 0750) Resp:  [16-26] 16 (08/22 0750) BP: (107-131)/(72-99) 107/83 mmHg (08/22 0750) SpO2:  [90 %-95 %] 95 % (08/22 0836) Weight:  [210 lb (95.255 kg)] 210 lb (95.255 kg) (08/21 2005) Last BM Date: 09/18/14  Intake/Output from previous day: 08/21 0701 - 08/22 0700 In: 216 [I.V.:216] Out: 500 [Urine:100; Emesis/NG output:400] Intake/Output this shift:    Physical exam:  Abdomen is soft distended minimally tympanitic nontender no scars are noted. A gastric tube is present. Calves are nontender. She is awake alert and oriented. Shows no jaundice.  Lab Results: CBC   Recent Labs  09/18/14 2054 09/19/14 0619  WBC 11.7* 10.0  HGB 15.9 15.4  HCT 47.9 45.1  PLT 203 198   BMET  Recent Labs  09/18/14 2054 09/19/14 0619  NA 138 138  K 4.0 4.1  CL 104 104  CO2 25 26  GLUCOSE 144* 150*  BUN 17 16  CREATININE 0.87 0.89  CALCIUM 9.3 8.9   PT/INR No results for input(s): LABPROT, INR in the last 72 hours. ABG No results for input(s): PHART, HCO3 in the last 72 hours.  Invalid input(s): PCO2, PO2  Studies/Results: Dg Chest 2 View  09/18/2014   CLINICAL DATA:  Shortness of breath beginning 09/17/2014.  EXAM: CHEST  2 VIEW  COMPARISON:  CT chest and PA and lateral chest 08/02/2013.  FINDINGS: There is mild cardiomegaly without edema. The chronic, mild scar atelectasis in the left lower lobe is noted. The right lung is clear. No pneumothorax or pleural  effusion. No focal bony abnormality.  IMPRESSION: No acute disease.   Electronically Signed   By: Drusilla Kanner M.D.   On: 09/18/2014 20:55   Dg Abd 1 View  09/19/2014   CLINICAL DATA:  NG tube placement.  EXAM: ABDOMEN - 1 VIEW  COMPARISON:  09/18/2014  FINDINGS: Enteric tube is been placed with tip in the left upper quadrant consistent with location in the upper stomach. Persisting gaseous distention of small bowel consistent with small bowel obstruction. Residual contrast material in the renal collecting systems.  IMPRESSION: Enteric tube tip in the left upper quadrant consistent with location in the upper stomach. Persistent small bowel distention consistent with obstruction.   Electronically Signed   By: Burman Nieves M.D.   On: 09/19/2014 01:43   Ct Abdomen Pelvis W Contrast  09/18/2014   CLINICAL DATA:  Abdominal pain and distention for 3 days. Evidence of obstruction on abdominal found.  EXAM: CT ABDOMEN AND PELVIS WITH CONTRAST  TECHNIQUE: Multidetector CT imaging of the abdomen and pelvis was performed using the standard protocol following bolus administration of intravenous contrast.  CONTRAST:  OMNIPAQUE IOHEXOL 350 MG/ML SOLN  COMPARISON:  CT abdomen and pelvis 06/20/2012  FINDINGS: Scarring and atelectasis in the lung bases. Calcified granulomas in the right lung base and right hilum. Small esophageal hiatal hernia.  Sub cm low-attenuation lesion in the lateral segment left lobe of the liver was  present previously and probably represents a small cyst although too small to characterize. Gallbladder, spleen, pancreas, adrenal glands, kidneys, abdominal aorta, inferior vena cava, and retroperitoneal lymph nodes are unremarkable. Scattered mesenteric lymph nodes are not pathologically enlarged. Stomach is not distended. Mid abdominal small bowel are not dilated and gas-filled with air-fluid levels. Terminal ileum is decompressed. Changes are consistent with small bowel obstruction.  Transition zone appears to be in the right lower quadrant. No mass is identified. Multiple loops of small bowel are demonstrated lateral to the cecum in the right lower quadrant. This could indicate an internal hernia. Adhesion is another possible etiology for obstruction. No bowel wall thickening or pneumatosis. No portal venous gas. Colon is mostly decompressed with scattered stool. Scattered diverticula in the colon without inflammatory changes. No free air or free fluid in the abdomen.  Pelvis: Prostate gland is enlarged, measuring 5.7 x 6.1 cm. Bladder wall is not thickened. Diverticulosis of sigmoid colon without evidence of diverticulitis. No free or loculated pelvic fluid collections. No pelvic mass or lymphadenopathy. Appendix is normal. Degenerative changes in the lumbar spine and hips. Endplate compression deformity at L1. No destructive bone lesions.  IMPRESSION: Changes of small bowel obstruction with transition zone in the right lower quadrant. Possible internal hernia versus adhesions. No evidence of bowel ischemia. Prostate gland is enlarged.   Electronically Signed   By: Burman Nieves M.D.   On: 09/18/2014 23:55   Dg Abd 2 Views  09/18/2014   CLINICAL DATA:  Abdominal bloating and distention. Belching. No vomiting.  EXAM: ABDOMEN - 2 VIEW  COMPARISON:  CT abdomen and pelvis 06/20/2012.  Abdomen 01/06/2011.  FINDINGS: Gaseous distention of multiple small bowel loops in the mid abdomen. Air-fluid levels demonstrated in the upright view. Changes are consistent with small bowel obstruction. No free air. Degenerative changes in the spine. No radiopaque stones identified.  IMPRESSION: Gaseous distention of small bowel in the mid abdomen with air-fluid levels consistent with small bowel obstruction.   Electronically Signed   By: Burman Nieves M.D.   On: 09/18/2014 20:59    Anti-infectives: Anti-infectives    None      Assessment/Plan:  Single abdominal films personally  reviewed.  Patient subjectively is improved but his KUB still shows a mall bowel obstruction there is that same dilated loop that is seen on the CT scan which was also personally reviewed. He does have gas in his colon however seen on KUB as well. With him feeling better and his exam being better I will perform a two-view film tomorrow of and consider surgery only if he fails to progress tomorrow. He was in agreement with this plan. This been discussed with Dr. Juliann Pulse.  Lattie Haw, MD, [facility]  09/19/2014

## 2014-09-19 NOTE — ED Notes (Signed)
Pt assisted uprite on stretcher; no distress noted, no c/o voiced; pt & son voice good understanding of plan of care & pt prepped for NG insertion

## 2014-09-20 ENCOUNTER — Inpatient Hospital Stay: Payer: Medicare Other

## 2014-09-20 DIAGNOSIS — K5669 Other intestinal obstruction: Secondary | ICD-10-CM

## 2014-09-20 DIAGNOSIS — K56609 Unspecified intestinal obstruction, unspecified as to partial versus complete obstruction: Secondary | ICD-10-CM | POA: Insufficient documentation

## 2014-09-20 LAB — BASIC METABOLIC PANEL
Anion gap: 7 (ref 5–15)
BUN: 19 mg/dL (ref 6–20)
CHLORIDE: 107 mmol/L (ref 101–111)
CO2: 26 mmol/L (ref 22–32)
CREATININE: 0.98 mg/dL (ref 0.61–1.24)
Calcium: 8.4 mg/dL — ABNORMAL LOW (ref 8.9–10.3)
GFR calc Af Amer: >60 mL/min (ref >60)
Glucose, Bld: 84 mg/dL (ref 65–99)
Potassium: 4 mmol/L (ref 3.5–5.1)
SODIUM: 140 mmol/L (ref 135–145)

## 2014-09-20 LAB — CBC WITH DIFFERENTIAL/PLATELET
Basophils Absolute: 0.2 10*3/uL — ABNORMAL HIGH (ref 0–0.1)
Basophils Relative: 2 %
EOS ABS: 0.1 10*3/uL (ref 0–0.7)
EOS PCT: 1 %
HCT: 44 % (ref 40.0–52.0)
Hemoglobin: 14.6 g/dL (ref 13.0–18.0)
LYMPHS ABS: 2.4 10*3/uL (ref 1.0–3.6)
Lymphocytes Relative: 26 %
MCH: 30.7 pg (ref 26.0–34.0)
MCHC: 33.3 g/dL (ref 32.0–36.0)
MCV: 92.2 fL (ref 80.0–100.0)
MONOS PCT: 9 %
Monocytes Absolute: 0.8 10*3/uL (ref 0.2–1.0)
Neutro Abs: 5.8 10*3/uL (ref 1.4–6.5)
Neutrophils Relative %: 62 %
PLATELETS: 193 10*3/uL (ref 150–440)
RBC: 4.77 MIL/uL (ref 4.40–5.90)
RDW: 12.9 % (ref 11.5–14.5)
WBC: 9.3 10*3/uL (ref 3.8–10.6)

## 2014-09-20 MED ORDER — ALBUTEROL SULFATE (2.5 MG/3ML) 0.083% IN NEBU
2.5000 mg | INHALATION_SOLUTION | Freq: Three times a day (TID) | RESPIRATORY_TRACT | Status: DC
  Administered 2014-09-20 – 2014-09-21 (×2): 2.5 mg via RESPIRATORY_TRACT
  Filled 2014-09-20 (×2): qty 3

## 2014-09-20 MED ORDER — BISACODYL 10 MG RE SUPP
10.0000 mg | Freq: Once | RECTAL | Status: AC
  Administered 2014-09-20: 10 mg via RECTAL
  Filled 2014-09-20: qty 1

## 2014-09-20 NOTE — Progress Notes (Signed)
CC: Small bowel obstruction Subjective: Patient admitted for small bowel obstruction he states he has no abdominal pain no nausea or vomiting his NG tube is in place with minimal output he has had extensive flatus since yesterday afternoon but no bowel movement  Objective: Vital signs in last 24 hours: Temp:  [97.7 F (36.5 C)-98.1 F (36.7 C)] 98.1 F (36.7 C) (08/23 0805) Pulse Rate:  [62-69] 69 (08/23 0805) Resp:  [16-18] 17 (08/23 0805) BP: (118-133)/(74-78) 133/78 mmHg (08/23 0805) SpO2:  [92 %-95 %] 93 % (08/23 0805) Last BM Date: 09/18/14  Intake/Output from previous day: 08/22 0701 - 08/23 0700 In: 2309 [I.V.:2249; NG/GT:60] Out: 1200 [Urine:700; Emesis/NG output:500] Intake/Output this shift:    Physical exam:  Vital signs are stable abdomen is soft slightly distended minimally tympanitic nontender much softer than yesterday calves are nontender.  Lab Results: CBC   Recent Labs  09/19/14 0619 09/20/14 0551  WBC 10.0 9.3  HGB 15.4 14.6  HCT 45.1 44.0  PLT 198 193   BMET  Recent Labs  09/19/14 0619 09/20/14 0551  NA 138 140  K 4.1 4.0  CL 104 107  CO2 26 26  GLUCOSE 150* 84  BUN 16 19  CREATININE 0.89 0.98  CALCIUM 8.9 8.4*   PT/INR No results for input(s): LABPROT, INR in the last 72 hours. ABG No results for input(s): PHART, HCO3 in the last 72 hours.  Invalid input(s): PCO2, PO2  Studies/Results: Dg Chest 2 View  09/18/2014   CLINICAL DATA:  Shortness of breath beginning 09/17/2014.  EXAM: CHEST  2 VIEW  COMPARISON:  CT chest and PA and lateral chest 08/02/2013.  FINDINGS: There is mild cardiomegaly without edema. The chronic, mild scar atelectasis in the left lower lobe is noted. The right lung is clear. No pneumothorax or pleural effusion. No focal bony abnormality.  IMPRESSION: No acute disease.   Electronically Signed   By: Drusilla Kanner M.D.   On: 09/18/2014 20:55   Dg Abd 1 View  09/19/2014   CLINICAL DATA:  NG tube placement.   EXAM: ABDOMEN - 1 VIEW  COMPARISON:  09/18/2014  FINDINGS: Enteric tube is been placed with tip in the left upper quadrant consistent with location in the upper stomach. Persisting gaseous distention of small bowel consistent with small bowel obstruction. Residual contrast material in the renal collecting systems.  IMPRESSION: Enteric tube tip in the left upper quadrant consistent with location in the upper stomach. Persistent small bowel distention consistent with obstruction.   Electronically Signed   By: Burman Nieves M.D.   On: 09/19/2014 01:43   Ct Abdomen Pelvis W Contrast  09/18/2014   CLINICAL DATA:  Abdominal pain and distention for 3 days. Evidence of obstruction on abdominal found.  EXAM: CT ABDOMEN AND PELVIS WITH CONTRAST  TECHNIQUE: Multidetector CT imaging of the abdomen and pelvis was performed using the standard protocol following bolus administration of intravenous contrast.  CONTRAST:  OMNIPAQUE IOHEXOL 350 MG/ML SOLN  COMPARISON:  CT abdomen and pelvis 06/20/2012  FINDINGS: Scarring and atelectasis in the lung bases. Calcified granulomas in the right lung base and right hilum. Small esophageal hiatal hernia.  Sub cm low-attenuation lesion in the lateral segment left lobe of the liver was present previously and probably represents a small cyst although too small to characterize. Gallbladder, spleen, pancreas, adrenal glands, kidneys, abdominal aorta, inferior vena cava, and retroperitoneal lymph nodes are unremarkable. Scattered mesenteric lymph nodes are not pathologically enlarged. Stomach is not distended. Mid  abdominal small bowel are not dilated and gas-filled with air-fluid levels. Terminal ileum is decompressed. Changes are consistent with small bowel obstruction. Transition zone appears to be in the right lower quadrant. No mass is identified. Multiple loops of small bowel are demonstrated lateral to the cecum in the right lower quadrant. This could indicate an internal hernia.  Adhesion is another possible etiology for obstruction. No bowel wall thickening or pneumatosis. No portal venous gas. Colon is mostly decompressed with scattered stool. Scattered diverticula in the colon without inflammatory changes. No free air or free fluid in the abdomen.  Pelvis: Prostate gland is enlarged, measuring 5.7 x 6.1 cm. Bladder wall is not thickened. Diverticulosis of sigmoid colon without evidence of diverticulitis. No free or loculated pelvic fluid collections. No pelvic mass or lymphadenopathy. Appendix is normal. Degenerative changes in the lumbar spine and hips. Endplate compression deformity at L1. No destructive bone lesions.  IMPRESSION: Changes of small bowel obstruction with transition zone in the right lower quadrant. Possible internal hernia versus adhesions. No evidence of bowel ischemia. Prostate gland is enlarged.   Electronically Signed   By: Burman Nieves M.D.   On: 09/18/2014 23:55   Dg Abd 2 Views  09/20/2014   CLINICAL DATA:  Small-bowel obstruction and recent nasogastric catheter placement  EXAM: ABDOMEN - 2 VIEW  COMPARISON:  09/19/2014  FINDINGS: Contrast material is again noted within the colon. Scattered areas of small bowel dilatation are seen but significantly improved when compared with the prior exam. A nasogastric catheter remains in the stomach. The appendix is within normal limits. Diverticular change of the colon is seen. Degenerative changes of the lumbar spine are noted.  IMPRESSION: Improving small bowel dilatation.   Electronically Signed   By: Alcide Clever M.D.   On: 09/20/2014 08:22   Dg Abd 2 Views  09/18/2014   CLINICAL DATA:  Abdominal bloating and distention. Belching. No vomiting.  EXAM: ABDOMEN - 2 VIEW  COMPARISON:  CT abdomen and pelvis 06/20/2012.  Abdomen 01/06/2011.  FINDINGS: Gaseous distention of multiple small bowel loops in the mid abdomen. Air-fluid levels demonstrated in the upright view. Changes are consistent with small bowel  obstruction. No free air. Degenerative changes in the spine. No radiopaque stones identified.  IMPRESSION: Gaseous distention of small bowel in the mid abdomen with air-fluid levels consistent with small bowel obstruction.   Electronically Signed   By: Burman Nieves M.D.   On: 09/18/2014 20:59   Dg Abd Portable 2v  09/19/2014   CLINICAL DATA:  Nasogastric catheter and abdominal bloating  EXAM: PORTABLE ABDOMEN - 2 VIEW  COMPARISON:  09/19/2014  FINDINGS: Nasogastric catheter is again seen within the stomach. Scattered dilated loops of small bowel are again noted. Contrast material is noted within the colon from recent CT examination consistent with a partial small bowel obstruction. No free air is noted.  IMPRESSION: Stable small bowel obstructive changes. No new focal abnormality is seen.   Electronically Signed   By: Alcide Clever M.D.   On: 09/19/2014 11:40    Anti-infectives: Anti-infectives    None      Assessment/Plan:  KUB is personally reviewed showing CT contrast into the colon with gas some small dilated loops overall much improved. At this point I would recommend D seeing the NG tube and starting on clear liquids and utilizing a bisacodyl suppository once in reviewing the patient's condition later today. Discussed with nursing patient and family.  Lattie Haw, MD, FACS  09/20/2014

## 2014-09-21 DIAGNOSIS — J449 Chronic obstructive pulmonary disease, unspecified: Secondary | ICD-10-CM

## 2014-09-21 MED ORDER — FINASTERIDE 5 MG PO TABS: 5.0000 mg | ORAL_TABLET | Freq: Every day | ORAL | Status: DC

## 2014-09-21 NOTE — Progress Notes (Signed)
Pt alert and oriented. Discharged to home. Concerns addressed, prescription given. IV site removed. Discharge summary given to patient. Information on small bowel given to patient.

## 2014-09-21 NOTE — Care Management Important Message (Signed)
Important Message  Patient Details  Name: Kyle Wells MRN: [id number] Date of Birth: May 21, 1933   Medicare Important Message Given:  Yes-second notification given    Olegario Messier A Allmond 09/21/2014, 11:02 AM

## 2014-09-21 NOTE — Progress Notes (Signed)
CC:  small bowel obstruction Subjective: This patient admitted the hospital with a diagnosis of partial small bowel obstruction. This is resolved he has no complaints no pain no nausea vomiting is tolerating a diet and is passing gas  Objective: Vital signs in last 24 hours: Temp:  [97.7 F (36.5 C)-98.2 F (36.8 C)] 97.7 F (36.5 C) (08/24 0745) Pulse Rate:  [57-71] 63 (08/24 0745) Resp:  [16-17] 16 (08/24 0745) BP: (106-133)/(49-83) 133/83 mmHg (08/24 0745) SpO2:  [93 %-97 %] 97 % (08/24 0745) Last BM Date: 09/18/14  Intake/Output from previous day: 2022-09-21 0701 - 08/24 0700 In: 2664 [P.O.:1300; I.V.:1364] Out: 750 [Urine:650; Emesis/NG output:100] Intake/Output this shift: Total I/O In: 960 [P.O.:960] Out: -   Physical exam:  Soft abdomen minimal tympany nontender nontender calves awake alert and oriented  Lab Results: CBC   Recent Labs  09/19/14 0619 21-Sep-2014 0551  WBC 10.0 9.3  HGB 15.4 14.6  HCT 45.1 44.0  PLT 198 193   BMET  Recent Labs  09/19/14 0619 Sep 21, 2014 0551  NA 138 140  K 4.1 4.0  CL 104 107  CO2 26 26  GLUCOSE 150* 84  BUN 16 19  CREATININE 0.89 0.98  CALCIUM 8.9 8.4*   PT/INR No results for input(s): LABPROT, INR in the last 72 hours. ABG No results for input(s): PHART, HCO3 in the last 72 hours.  Invalid input(s): PCO2, PO2  Studies/Results: Dg Abd 2 Views  09/21/2014   CLINICAL DATA:  Small-bowel obstruction and recent nasogastric catheter placement  EXAM: ABDOMEN - 2 VIEW  COMPARISON:  09/19/2014  FINDINGS: Contrast material is again noted within the colon. Scattered areas of small bowel dilatation are seen but significantly improved when compared with the prior exam. A nasogastric catheter remains in the stomach. The appendix is within normal limits. Diverticular change of the colon is seen. Degenerative changes of the lumbar spine are noted.  IMPRESSION: Improving small bowel dilatation.   Electronically Signed   By: Alcide Clever  M.D.   On: 09-21-14 08:22    Anti-infectives: Anti-infectives    None      Assessment/Plan:  This a patient with a bowel obstruction of unclear etiology that has completely resolved he wishes to be discharged and does not want to have any surgical or investigative procedures. He requires no additional medications on discharge and will be followed up on an as-needed basis.  Lattie Haw, MD, FACS  09/21/2014

## 2014-09-21 NOTE — Discharge Summary (Signed)
Physician Discharge Summary  Patient ID: Kyle Wells MRN: [id number] DOB/AGE: 79/08/1933 79 y.o.  Admit date: 09/18/2014 Discharge date: 09/21/2014  Admission Diagnoses: Partial small bowel obstruction  Discharge Diagnoses:  Active Problems:   Intestinal obstruction   SBO (small bowel obstruction)   Discharged Condition: Stable, resolved SBO  Hospital Course: This patient mid to the hospital with a second episode of partial small bowel obstruction that it previously occurred 4 years ago. A nasogastric tube was placed and his bowel obstruction resolved quickly and spontaneously he is currently currently tolerating a diet and requires no new medications he is passing gas and has had no nausea or vomiting.  Consults: None  Significant Diagnostic Studies:CT and KUB  Treatments: NG tube  Discharge Exam: Blood pressure 133/83, pulse 63, temperature 97.7 F (36.5 C), temperature source Oral, resp. rate 16, height  (1.702 m), weight 210 lb (95.255 kg), SpO2 97 %. Soft nontender abdomen with no sign of obstruction  Disposition:   Discharge Instructions    Discharge patient    Complete by:  As directed   Home            Medication List    TAKE these medications        ADVAIR DISKUS 500-50 MCG/DOSE Aepb  Generic drug:  Fluticasone-Salmeterol  Inhale 2 puffs into the lungs daily.     finasteride 5 MG tablet  Commonly known as:  PROSCAR  Take 1 tablet by mouth daily.     montelukast 10 MG tablet  Commonly known as:  SINGULAIR  Take 1 tablet by mouth daily.     MULTIVITAMIN & MINERAL PO  Take 1 tablet by mouth daily.     albuterol (2.5 MG/3ML) 0.083% nebulizer solution  Commonly known as:  PROVENTIL  Inhale 3 mLs into the lungs every 6 (six) hours.     PROAIR HFA 108 (90 BASE) MCG/ACT inhaler  Generic drug:  albuterol  Inhale 2 puffs into the lungs 2 (two) times daily.     SPIRIVA HANDIHALER 18 MCG inhalation capsule  Generic drug:  tiotropium  Place 2  puffs into inhaler and inhale daily.           Follow-up Information    Schedule an appointment as soon as possible for a visit with PCP.      Signed: Dionne Milo 09/21/2014, 12:08 PM

## 2014-09-21 NOTE — Discharge Instructions (Signed)
Follow-up with primary care physician. Regular diet Call Dr. Excell Seltzer office if he has a recurrence of his nausea and vomiting.

## 2014-10-21 ENCOUNTER — Other Ambulatory Visit: Payer: Self-pay | Admitting: Orthopedic Surgery

## 2014-10-21 ENCOUNTER — Ambulatory Visit
Admission: RE | Admit: 2014-10-21 | Discharge: 2014-10-21 | Disposition: A | Discharge status: Home / Self Care | Payer: Medicare Other | Source: Ambulatory Visit | Attending: Orthopedic Surgery | Admitting: Orthopedic Surgery

## 2014-10-21 DIAGNOSIS — M5126 Other intervertebral disc displacement, lumbar region: Secondary | ICD-10-CM | POA: Diagnosis not present

## 2014-10-21 DIAGNOSIS — M5136 Other intervertebral disc degeneration, lumbar region: Secondary | ICD-10-CM | POA: Diagnosis not present

## 2014-10-21 DIAGNOSIS — M545 Low back pain, unspecified: Secondary | ICD-10-CM

## 2014-10-21 DIAGNOSIS — M4806 Spinal stenosis, lumbar region: Secondary | ICD-10-CM | POA: Diagnosis not present

## 2014-10-21 DIAGNOSIS — M2578 Osteophyte, vertebrae: Secondary | ICD-10-CM | POA: Diagnosis not present

## 2014-10-21 DIAGNOSIS — M4856XA Collapsed vertebra, not elsewhere classified, lumbar region, initial encounter for fracture: Secondary | ICD-10-CM | POA: Insufficient documentation

## 2015-03-15 ENCOUNTER — Other Ambulatory Visit: Payer: Self-pay | Admitting: Physician Assistant

## 2015-03-15 DIAGNOSIS — M7541 Impingement syndrome of right shoulder: Secondary | ICD-10-CM

## 2015-03-28 ENCOUNTER — Ambulatory Visit (HOSPITAL_COMMUNITY)
Admission: RE | Admit: 2015-03-28 | Discharge: 2015-03-28 | Disposition: A | Discharge status: Home / Self Care | Payer: Medicare Other | Source: Ambulatory Visit | Attending: Physician Assistant | Admitting: Physician Assistant

## 2015-03-28 DIAGNOSIS — M7580 Other shoulder lesions, unspecified shoulder: Secondary | ICD-10-CM | POA: Insufficient documentation

## 2015-03-28 DIAGNOSIS — M75121 Complete rotator cuff tear or rupture of right shoulder, not specified as traumatic: Secondary | ICD-10-CM | POA: Diagnosis not present

## 2015-03-28 DIAGNOSIS — M7541 Impingement syndrome of right shoulder: Secondary | ICD-10-CM | POA: Diagnosis present

## 2015-04-05 ENCOUNTER — Ambulatory Visit: Payer: Medicare Other

## 2015-06-09 ENCOUNTER — Encounter: Payer: Self-pay | Admitting: Emergency Medicine

## 2015-06-09 ENCOUNTER — Emergency Department
Admission: EM | Admit: 2015-06-09 | Discharge: 2015-06-09 | Disposition: A | Discharge status: Home / Self Care | Payer: Medicare Other | Attending: Emergency Medicine | Admitting: Emergency Medicine

## 2015-06-09 DIAGNOSIS — Z79899 Other long term (current) drug therapy: Secondary | ICD-10-CM | POA: Insufficient documentation

## 2015-06-09 DIAGNOSIS — F17210 Nicotine dependence, cigarettes, uncomplicated: Secondary | ICD-10-CM | POA: Diagnosis not present

## 2015-06-09 DIAGNOSIS — R55 Syncope and collapse: Secondary | ICD-10-CM

## 2015-06-09 DIAGNOSIS — J45909 Unspecified asthma, uncomplicated: Secondary | ICD-10-CM | POA: Insufficient documentation

## 2015-06-09 DIAGNOSIS — J449 Chronic obstructive pulmonary disease, unspecified: Secondary | ICD-10-CM | POA: Diagnosis not present

## 2015-06-09 LAB — COMPREHENSIVE METABOLIC PANEL
ALK PHOS: 60 U/L (ref 38–126)
ALT: 27 U/L (ref 17–63)
ANION GAP: 9 (ref 5–15)
AST: 35 U/L (ref 15–41)
Albumin: 4 g/dL (ref 3.5–5.0)
BILIRUBIN TOTAL: 0.5 mg/dL (ref 0.3–1.2)
BUN: 23 mg/dL — ABNORMAL HIGH (ref 6–20)
CALCIUM: 9.4 mg/dL (ref 8.9–10.3)
CO2: 24 mmol/L (ref 22–32)
Chloride: 105 mmol/L (ref 101–111)
Creatinine, Ser: 0.98 mg/dL (ref 0.61–1.24)
GFR calc non Af Amer: >60 mL/min (ref >60)
Glucose, Bld: 105 mg/dL — ABNORMAL HIGH (ref 65–99)
Potassium: 4 mmol/L (ref 3.5–5.1)
Sodium: 138 mmol/L (ref 135–145)
TOTAL PROTEIN: 7.1 g/dL (ref 6.5–8.1)

## 2015-06-09 LAB — TROPONIN I

## 2015-06-09 LAB — CBC WITH DIFFERENTIAL/PLATELET
Basophils Absolute: 0 10*3/uL (ref 0–0.1)
Basophils Relative: 0 %
EOS ABS: 0.1 10*3/uL (ref 0–0.7)
Eosinophils Relative: 1 %
HEMATOCRIT: 45.6 % (ref 40.0–52.0)
HEMOGLOBIN: 15.6 g/dL (ref 13.0–18.0)
LYMPHS ABS: 2 10*3/uL (ref 1.0–3.6)
Lymphocytes Relative: 24 %
MCH: 30.9 pg (ref 26.0–34.0)
MCHC: 34.2 g/dL (ref 32.0–36.0)
MCV: 90.3 fL (ref 80.0–100.0)
MONO ABS: 0.7 10*3/uL (ref 0.2–1.0)
NEUTROS ABS: 5.5 10*3/uL (ref 1.4–6.5)
Platelets: 200 10*3/uL (ref 150–440)
RBC: 5.05 MIL/uL (ref 4.40–5.90)
RDW: 13 % (ref 11.5–14.5)
WBC: 8.3 10*3/uL (ref 3.8–10.6)

## 2015-06-09 MED ORDER — SODIUM CHLORIDE 0.9 % IV BOLUS (SEPSIS)
500.0000 mL | Freq: Once | INTRAVENOUS | Status: AC
  Administered 2015-06-09: 500 mL via INTRAVENOUS

## 2015-06-09 MED ORDER — ONDANSETRON HCL 4 MG/2ML IJ SOLN
4.0000 mg | Freq: Once | INTRAMUSCULAR | Status: AC
  Administered 2015-06-09: 4 mg via INTRAVENOUS

## 2015-06-09 MED ORDER — ONDANSETRON HCL 4 MG/2ML IJ SOLN
4.0000 mg | Freq: Once | INTRAMUSCULAR | Status: DC
  Filled 2015-06-09: qty 2

## 2015-06-09 NOTE — ED Provider Notes (Signed)
Time Seen: Approximately 10:15 I have reviewed the triage notes  Chief Complaint: Near Syncope   History of Present Illness: Kyle Wells is a 82 y.o. male *who presents with what sounds like a near syncopal episode while the patient was at home today. He states he was sitting and waiting for his breakfast when he became somewhat lightheaded and nauseated and sweaty. He denies any chest pain associated with it. He describes some mild shortness of breath. He denies any recent pulmonary emboli risk factors. He does have a history of COPD and states he is on home oxygen as needed. He denies any fever or chills or productive cough. Patient denies any focal weakness in either upper or lower extremities. He denies any actual syncope associated with today's episode. He states he feels improved now, except for some generalized fatigue.   Past Medical History  Diagnosis Date  . COPD (chronic obstructive pulmonary disease) (HCC)   . Asthma     Patient Active Problem List   Diagnosis Date Noted  . COPD, mild (HCC)   . SBO (small bowel obstruction) (HCC)   . Intestinal obstruction (HCC) 09/19/2014    History reviewed. No pertinent past surgical history.  History reviewed. No pertinent past surgical history.  Current Outpatient Rx  Name  Route  Sig  Dispense  Refill  . ADVAIR DISKUS 500-50 MCG/DOSE AEPB   Inhalation   Inhale 2 puffs into the lungs daily.           Dispense as written.   Marland Kitchen albuterol (PROVENTIL) (2.5 MG/3ML) 0.083% nebulizer solution   Inhalation   Inhale 3 mLs into the lungs every 6 (six) hours.         . finasteride (PROSCAR) 5 MG tablet   Oral   Take 1 tablet (5 mg total) by mouth daily.   30 tablet   1   . montelukast (SINGULAIR) 10 MG tablet   Oral   Take 1 tablet by mouth daily.         . Multiple Vitamins-Minerals (MULTIVITAMIN & MINERAL PO)   Oral   Take 1 tablet by mouth daily.         . Naproxen Sodium (ALEVE) 220 MG CAPS   Oral   Take  1 capsule by mouth every 6 (six) hours as needed (headache, mild pain).         Marland Kitchen PROAIR HFA 108 (90 BASE) MCG/ACT inhaler   Inhalation   Inhale 2 puffs into the lungs 2 (two) times daily.           Dispense as written.   . ranitidine (ZANTAC) 150 MG tablet   Oral   Take 150 mg by mouth 2 (two) times daily as needed for heartburn.         Marland Kitchen SPIRIVA HANDIHALER 18 MCG inhalation capsule   Inhalation   Place 2 puffs into inhaler and inhale daily.           Dispense as written.     Allergies:  Review of patient's allergies indicates no known allergies.  Family History: History reviewed. No pertinent family history.  Social History: Social History  Substance Use Topics  . Smoking status: Current Some Day Smoker -- 0.10 packs/day    Types: Cigarettes  . Smokeless tobacco: Never Used  . Alcohol Use: No     Review of Systems:   10 point review of systems was performed and was otherwise negative:  Constitutional: No fever Eyes: No visual disturbances  ENT: No sore throat, ear pain Cardiac: No chest pain Respiratory: No shortness of breath, wheezing, or stridor Abdomen: No abdominal pain, no vomiting, No diarrhea Endocrine: No weight loss, No night sweats Extremities: No peripheral edema, cyanosis Skin: No rashes, easy bruising Neurologic: No focal weakness, trouble with speech or swollowing Urologic: No dysuria, Hematuria, or urinary frequency No melena or hematochezia  Physical Exam:  ED Triage Vitals  Enc Vitals Group     BP 06/09/15 0855 125/86 mmHg     Pulse Rate 06/09/15 0855 56     Resp 06/09/15 0855 18     Temp 06/09/15 0855 97.4 F (36.3 C)     Temp Source 06/09/15 0855 Oral     SpO2 06/09/15 0855 95 %     Weight 06/09/15 0855 212 lb (96.163 kg)     Height 06/09/15 0855 5\' 8"  (1.727 m)     Head Cir --      Peak Flow --      Pain Score 06/09/15 0856 0     Pain Loc --      Pain Edu? --      Excl. in GC? --     General: Awake , Alert , and  Oriented times 3; GCS 15 Head: Normal cephalic , atraumatic Eyes: Pupils equal , round, reactive to light Nose/Throat: No nasal drainage, patent upper airway without erythema or exudate.  Neck: Supple, Full range of motion, No anterior adenopathy or palpable thyroid masses Lungs: Clear to ascultation without wheezes , rhonchi, or rales Heart: Regular rate, regular rhythm without murmurs , gallops , or rubs Abdomen: Soft, non tender without rebound, guarding , or rigidity; bowel sounds positive and symmetric in all 4 quadrants. No organomegaly .        Extremities: 2 plus symmetric pulses. No edema, clubbing or cyanosis Neurologic: normal ambulation, Motor symmetric without deficits, sensory intact Skin: warm, dry, no rashes   Labs:   All laboratory work was reviewed including any pertinent negatives or positives listed below:  Labs Reviewed  COMPREHENSIVE METABOLIC PANEL - Abnormal; Notable for the following:    Glucose, Bld 105 (*)    BUN 23 (*)    All other components within normal limits  CBC WITH DIFFERENTIAL/PLATELET  TROPONIN I  Laboratory work was reviewed and showed no clinically significant abnormalities.   EKG:  ED ECG REPORT I, Jennye MoccasinBrian S Quigley, the attending physician, personally viewed and interpreted this ECG.  Date: 06/09/2015 EKG Time: 0858 Rate: 55 Rhythm: Sinus bradycardia without any ischemic changes QRS Axis: normal Intervals: normal ST/T Wave abnormalities: normal Conduction Disturbances: none Narrative Interpretation: unremarkable    ED Course: * Patient's stay was uneventful he had orthostatic blood pressures performed here which were negative and he states overall he feels improved with an IV fluid bolus. Not sure the exact nature of his near syncopal episode but I felt was unlikely had some form of cardiogenic arrhythmia especially since he had no loss of consciousness, chest pain, or any other focal findings. The patient is on Proscar and one of the  side effects of this medication is some near syncopal type symptoms with feeling lightheaded. Patient was advised to drink plenty of fluids and continue all of his current medications and return here especially develops any chest pain, heart palpitations or any other new concerns.    Assessment:  Near syncope   Final Clinical Impression:   Final diagnoses:  Near syncope     Plan:  Outpatient management Patient  was advised to return immediately if condition worsens. Patient was advised to follow up with their primary care physician or other specialized physicians involved in their outpatient care. The patient and/or family member/power of attorney had laboratory results reviewed at the bedside. All questions and concerns were addressed and appropriate discharge instructions were distributed by the nursing staff.            Jennye Moccasin, MD 06/09/15 [phone]

## 2015-06-09 NOTE — ED Notes (Addendum)
81 yom PMHx COPD (o2 for sleep only) presents from home via EMS for lightheadedness. States he was sitting for breakfast and became lightheaded. +nausea +sweatiness +shortness of breath.

## 2015-06-09 NOTE — Discharge Instructions (Signed)
Near-Syncope °Near-syncope (commonly known as near fainting) is sudden weakness, dizziness, or feeling like you might pass out. During an episode of near-syncope, you may also develop pale skin, have tunnel vision, or feel sick to your stomach (nauseous). Near-syncope may occur when getting up after sitting or while standing for a long time. It is caused by a sudden decrease in blood flow to the brain. This decrease can result from various causes or triggers, most of which are not serious. However, because near-syncope can sometimes be a sign of something serious, a medical evaluation is required. The specific cause is often not determined. °HOME CARE INSTRUCTIONS  °Monitor your condition for any changes. The following actions may help to alleviate any discomfort you are experiencing: °· Have someone stay with you until you feel stable. °· Lie down right away and prop your feet up if you start feeling like you might faint. Breathe deeply and steadily. Wait until all the symptoms have passed. Most of these episodes last only a few minutes. You may feel tired for several hours.   °· Drink enough fluids to keep your urine clear or pale yellow.   °· If you are taking blood pressure or heart medicine, get up slowly when seated or lying down. Take several minutes to sit and then stand. This can reduce dizziness. °· Follow up with your health care provider as directed.  °SEEK IMMEDIATE MEDICAL CARE IF:  °· You have a severe headache.   °· You have unusual pain in the chest, abdomen, or back.   °· You are bleeding from the mouth or rectum, or you have black or tarry stool.   °· You have an irregular or very fast heartbeat.   °· You have repeated fainting or have seizure-like jerking during an episode.   °· You faint when sitting or lying down.   °· You have confusion.   °· You have difficulty walking.   °· You have severe weakness.   °· You have vision problems.   °MAKE SURE YOU:  °· Understand these instructions. °· Will  watch your condition. °· Will get help right away if you are not doing well or get worse. °  °This information is not intended to replace advice given to you by your health care provider. Make sure you discuss any questions you have with your health care provider. °  °Document Released: 01/14/2005 Document Revised: 01/19/2013 Document Reviewed: 06/19/2012 °Elsevier Interactive Patient Education ©2016 [employer] ° ° °Syncope, commonly known as fainting, is a temporary loss of consciousness. It occurs when the blood flow to the brain is reduced.  ° °Vasovagal syncope (also called neurocardiogenic syncope) is a fainting spell in which the blood flow to the brain is reduced because of a sudden drop in heart rate and blood pressure. Vasovagal syncope occurs when the brain and the cardiovascular system (blood vessels) do not adequately communicate and respond to each other. This is the most common cause of fainting. It often occurs in response to fear or some other type of emotional or physical stress. The body has a reaction in which the heart starts beating too slowly or the blood vessels expand, reducing blood pressure. This type of fainting spell is generally considered harmless. However, injuries can occur if a person takes a sudden fall during a fainting spell.  °CAUSES  °Vasovagal syncope occurs when a person's blood pressure and heart rate decrease suddenly, usually in response to a trigger. Many things and situations can trigger an episode. Some of these include:  °· Pain.   °· Fear.   °·   The sight of blood or medical procedures, such as blood being drawn from a vein.   °· Common activities, such as coughing, swallowing, stretching, or going to the bathroom.   °· Emotional stress.   °· Prolonged standing, especially in a warm environment.   °· Lack of sleep or rest.   °· Prolonged lack of food.   °· Prolonged lack of fluids.   °· Recent illness. °· The use of certain drugs that affect blood pressure, such as  cocaine, alcohol, marijuana, inhalants, and opiates.   °SYMPTOMS  °Before the fainting episode, you may:  °· Feel dizzy or light headed.   °· Become pale. °· Sense that you are going to faint.   °· Feel like the room is spinning.   °· Have tunnel vision, only seeing directly in front of you.   °· Feel sick to your stomach (nauseous).   °· See spots or slowly lose vision.   °· Hear ringing in your ears.   °· Have a headache.   °· Feel warm and sweaty.   °· Feel a sensation of pins and needles. °During the fainting spell, you will generally be unconscious for no longer than a couple minutes before waking up and returning to normal. If you get up too quickly before your body can recover, you may faint again. Some twitching or jerky movements may occur during the fainting spell.  °DIAGNOSIS  °Your health care provider will ask about your symptoms, take a medical history, and perform a physical exam. Various tests may be done to rule out other causes of fainting. These may include blood tests and tests to check the heart, such as electrocardiography, echocardiography, and possibly an electrophysiology study. When other causes have been ruled out, a test may be done to check the body's response to changes in position (tilt table test). °TREATMENT  °Most cases of vasovagal syncope do not require treatment. Your health care provider may recommend ways to avoid fainting triggers and may provide home strategies for preventing fainting. If you must be exposed to a possible trigger, you can drink additional fluids to help reduce your chances of having an episode of vasovagal syncope. If you have warning signs of an oncoming episode, you can respond by positioning yourself favorably (lying down). °If your fainting spells continue, you may be given medicines to prevent fainting. Some medicines may help make you more resistant to repeated episodes of vasovagal syncope. Special exercises or compression stockings may be recommended.  In rare cases, the surgical placement of a pacemaker is considered. °HOME CARE INSTRUCTIONS  °· Learn to identify the warning signs of vasovagal syncope.   °· Sit or lie down at the first warning sign of a fainting spell. If sitting, put your head down between your legs. If you lie down, swing your legs up in the air to increase blood flow to the brain.   °· Avoid hot tubs and saunas. °· Avoid prolonged standing. °· Drink enough fluids to keep your urine clear or pale yellow. Avoid caffeine. °· Increase salt in your diet as directed by your health care provider.   °· If you have to stand for a long time, perform movements such as:   °¨ Crossing your legs.   °¨ Flexing and stretching your leg muscles.   °¨ Squatting.   °¨ Moving your legs.   °¨ Bending over.   °· Only take over-the-counter or prescription medicines as directed by your health care provider. Do not suddenly stop any medicines without asking your health care provider first.  °SEEK MEDICAL CARE IF:  °· Your fainting spells continue or happen more frequently in spite   of treatment.   °· You lose consciousness for more than a couple minutes. °· You have fainting spells during or after exercising or after being startled.   °· You have new symptoms that occur with the fainting spells, such as:   °¨ Shortness of breath. °¨ Chest pain.   °¨ Irregular heartbeat.   °· You have episodes of twitching or jerky movements that last longer than a few seconds. °· You have episodes of twitching or jerky movements without obvious fainting. °SEEK IMMEDIATE MEDICAL CARE IF:  °· You have injuries or bleeding after a fainting spell.   °· You have episodes of twitching or jerky movements that last longer than 5 minutes.   °· You have more than one spell of twitching or jerky movements before returning to consciousness after fainting. °  °This information is not intended to replace advice given to you by your health care provider. Make sure you discuss any questions you have  with your health care provider. °  °Document Released: 01/01/2012 Document Revised: 05/31/2014 Document Reviewed: 01/01/2012 °Elsevier Interactive Patient Education ©2016 [employer] ° ° °

## 2015-08-29 ENCOUNTER — Encounter: Payer: Self-pay | Admitting: Emergency Medicine

## 2015-08-29 ENCOUNTER — Emergency Department: Payer: Medicare Other

## 2015-08-29 ENCOUNTER — Emergency Department
Admission: EM | Admit: 2015-08-29 | Discharge: 2015-08-29 | Disposition: A | Discharge status: Home / Self Care | Payer: Medicare Other | Attending: Emergency Medicine | Admitting: Emergency Medicine

## 2015-08-29 DIAGNOSIS — F17210 Nicotine dependence, cigarettes, uncomplicated: Secondary | ICD-10-CM | POA: Diagnosis not present

## 2015-08-29 DIAGNOSIS — Z79899 Other long term (current) drug therapy: Secondary | ICD-10-CM | POA: Diagnosis not present

## 2015-08-29 DIAGNOSIS — R197 Diarrhea, unspecified: Secondary | ICD-10-CM | POA: Insufficient documentation

## 2015-08-29 DIAGNOSIS — J449 Chronic obstructive pulmonary disease, unspecified: Secondary | ICD-10-CM | POA: Insufficient documentation

## 2015-08-29 DIAGNOSIS — J45909 Unspecified asthma, uncomplicated: Secondary | ICD-10-CM | POA: Diagnosis not present

## 2015-08-29 DIAGNOSIS — R109 Unspecified abdominal pain: Secondary | ICD-10-CM | POA: Insufficient documentation

## 2015-08-29 DIAGNOSIS — R112 Nausea with vomiting, unspecified: Secondary | ICD-10-CM | POA: Diagnosis not present

## 2015-08-29 LAB — BASIC METABOLIC PANEL
ANION GAP: 9 (ref 5–15)
BUN: 22 mg/dL — ABNORMAL HIGH (ref 6–20)
CHLORIDE: 104 mmol/L (ref 101–111)
CO2: 26 mmol/L (ref 22–32)
CREATININE: 0.86 mg/dL (ref 0.61–1.24)
Calcium: 9.4 mg/dL (ref 8.9–10.3)
GFR calc non Af Amer: >60 mL/min (ref >60)
Glucose, Bld: 147 mg/dL — ABNORMAL HIGH (ref 65–99)
POTASSIUM: 3.9 mmol/L (ref 3.5–5.1)
SODIUM: 139 mmol/L (ref 135–145)

## 2015-08-29 LAB — CBC
HEMATOCRIT: 48 % (ref 40.0–52.0)
HEMOGLOBIN: 16.6 g/dL (ref 13.0–18.0)
MCH: 31.3 pg (ref 26.0–34.0)
MCHC: 34.5 g/dL (ref 32.0–36.0)
MCV: 90.7 fL (ref 80.0–100.0)
PLATELETS: 200 10*3/uL (ref 150–440)
RBC: 5.29 MIL/uL (ref 4.40–5.90)
RDW: 13.1 % (ref 11.5–14.5)
WBC: 16.3 10*3/uL — AB (ref 3.8–10.6)

## 2015-08-29 LAB — TROPONIN I: Troponin I: <0.03 ng/mL (ref <0.03)

## 2015-08-29 MED ORDER — ONDANSETRON HCL 4 MG/2ML IJ SOLN
INTRAMUSCULAR | Status: AC
  Administered 2015-08-29: 4 mg via INTRAVENOUS
  Filled 2015-08-29: qty 2

## 2015-08-29 MED ORDER — SODIUM CHLORIDE 0.9 % IV BOLUS (SEPSIS)
1000.0000 mL | Freq: Once | INTRAVENOUS | Status: AC
Start: 2015-08-29 — End: 2015-08-29
  Administered 2015-08-29: 1000 mL via INTRAVENOUS

## 2015-08-29 MED ORDER — ONDANSETRON HCL 4 MG PO TABS: 4.0000 mg | ORAL_TABLET | Freq: Three times a day (TID) | ORAL | 0 refills | Status: DC | PRN

## 2015-08-29 MED ORDER — PROMETHAZINE HCL 12.5 MG RE SUPP: 12.5000 mg | Freq: Four times a day (QID) | RECTAL | 0 refills | Status: DC | PRN

## 2015-08-29 MED ORDER — PROMETHAZINE HCL 25 MG/ML IJ SOLN
6.2500 mg | Freq: Once | INTRAMUSCULAR | Status: AC
  Administered 2015-08-29: 6.25 mg via INTRAVENOUS
  Filled 2015-08-29: qty 1

## 2015-08-29 MED ORDER — DIATRIZOATE MEGLUMINE & SODIUM 66-10 % PO SOLN
15.0000 mL | Freq: Once | ORAL | Status: AC
  Administered 2015-08-29: 15 mL via ORAL

## 2015-08-29 MED ORDER — ONDANSETRON HCL 4 MG/2ML IJ SOLN
4.0000 mg | Freq: Once | INTRAMUSCULAR | Status: AC
  Administered 2015-08-29: 4 mg via INTRAVENOUS

## 2015-08-29 MED ORDER — IOPAMIDOL (ISOVUE-300) INJECTION 61%
100.0000 mL | Freq: Once | INTRAVENOUS | Status: AC | PRN
  Administered 2015-08-29: 100 mL via INTRAVENOUS

## 2015-08-29 MED ORDER — SODIUM CHLORIDE 0.9 % IV BOLUS (SEPSIS)
500.0000 mL | Freq: Once | INTRAVENOUS | Status: AC
  Administered 2015-08-29: 500 mL via INTRAVENOUS

## 2015-08-29 MED ORDER — PROMETHAZINE HCL 12.5 MG PO TABS: 12.5000 mg | ORAL_TABLET | Freq: Four times a day (QID) | ORAL | 0 refills | Status: DC | PRN

## 2015-08-29 NOTE — ED Triage Notes (Signed)
Patient report that he is feeling weak and started having nausea, vomiting and diarrhea around midnight. Patient reports that he has vomited times 2 and has had 3 episodes of diarrhea. Patient reports that he has a history of copd but has increase in his shortness of breath.

## 2015-08-29 NOTE — ED Notes (Addendum)
Nausea med administered as ordered    Pt tolerated well   Stand by assistance to the BR provided

## 2015-08-29 NOTE — ED Notes (Signed)
Pt taken to xray via wheelchair

## 2015-08-29 NOTE — ED Provider Notes (Signed)
[facility] Emergency Department Provider Note  ____________________________________________   First MD Initiated Contact with Patient 08/29/15 0542     (approximate)  I have reviewed the triage vital signs and the nursing notes.   HISTORY  Chief Complaint Emesis; Diarrhea; and Shortness of Breath   HPI Kyle Wells is a 82 y.o. male resents with acute onset of abdominal cramping vomiting and diarrhea at midnight tonight. Patient describes current discomfort is mild. Patient denies any fever.   Past Medical History:  Diagnosis Date  . Asthma   . COPD (chronic obstructive pulmonary disease) [facility])     Patient Active Problem List   Diagnosis Date Noted  . COPD, mild (HCC)   . SBO (small bowel obstruction) (HCC)   . Intestinal obstruction (HCC) 09/19/2014    Past surgical history None  Prior to Admission medications   Medication Sig Start Date End Date Taking? Authorizing Provider  ADVAIR DISKUS 500-50 MCG/DOSE AEPB Inhale 2 puffs into the lungs daily. 09/12/14   Historical Provider, MD  albuterol (PROVENTIL) (2.5 MG/3ML) 0.083% nebulizer solution Inhale 3 mLs into the lungs every 6 (six) hours. 02/21/14   Historical Provider, MD  finasteride (PROSCAR) 5 MG tablet Take 1 tablet (5 mg total) by mouth daily. 09/21/14   Lattie Haw, MD  montelukast (SINGULAIR) 10 MG tablet Take 1 tablet by mouth daily. 09/12/14   Historical Provider, MD  Multiple Vitamins-Minerals (MULTIVITAMIN & MINERAL PO) Take 1 tablet by mouth daily. 11/11/08   Historical Provider, MD  Naproxen Sodium (ALEVE) 220 MG CAPS Take 1 capsule by mouth every 6 (six) hours as needed (headache, mild pain).    Historical Provider, MD  PROAIR HFA 108 (90 BASE) MCG/ACT inhaler Inhale 2 puffs into the lungs 2 (two) times daily. 09/12/14   Historical Provider, MD  ranitidine (ZANTAC) 150 MG tablet Take 150 mg by mouth 2 (two) times daily as needed for heartburn.    Historical Provider, MD    SPIRIVA HANDIHALER 18 MCG inhalation capsule Place 2 puffs into inhaler and inhale daily. 09/13/14   Historical Provider, MD    Allergies No known drug allergies No family history on file.  Social History Social History  Substance Use Topics  . Smoking status: Current Some Day Smoker    Packs/day: 0.10    Types: Cigarettes  . Smokeless tobacco: Never Used  . Alcohol use No    Review of Systems Constitutional: No fever/chills Eyes: No visual changes. ENT: No sore throat. Cardiovascular: Denies chest pain. Respiratory: Denies shortness of breath. Gastrointestinal: Positive for abdominal pain and vomiting-diarrhea Genitourinary: Negative for dysuria. Musculoskeletal: Negative for back pain. Skin: Negative for rash. Neurological: Negative for headaches, focal weakness or numbness.  10-point ROS otherwise negative.  ____________________________________________   PHYSICAL EXAM:  VITAL SIGNS: ED Triage Vitals  Enc Vitals Group     BP 08/29/15 0508 123/74     Pulse Rate 08/29/15 0507 76     Resp 08/29/15 0507 20     Temp 08/29/15 0507 98.1 F (36.7 C)     Temp Source 08/29/15 0507 Oral     SpO2 08/29/15 0507 95 %     Weight 08/29/15 0507 213 lb (96.6 kg)     Height 08/29/15 0507  (1.702 m)     Head Circumference --      Peak Flow --      Pain Score --      Pain Loc --      Pain  Edu? --      Excl. in GC? --     Constitutional: Alert and oriented. Well appearing and in no acute distress. Eyes: Conjunctivae are normal. PERRL. EOMI. Head: Atraumatic. Mouth/Throat: Mucous membranes are moist.  Oropharynx non-erythematous. Neck: No stridor.  No meningeal signs.   Cardiovascular: Normal rate, regular rhythm. Good peripheral circulation. Grossly normal heart sounds.   Respiratory: Normal respiratory effort.  No retractions. Lungs CTAB. Gastrointestinal: Soft and nontender. No distention.  Musculoskeletal: No lower extremity tenderness nor edema. No gross  deformities of extremities. Neurologic:  Normal speech and language. No gross focal neurologic deficits are appreciated.  Skin:  Skin is warm, dry and intact. No rash noted.   ____________________________________________   LABS (all labs ordered are listed, but only abnormal results are displayed)  Labs Reviewed  BASIC METABOLIC PANEL - Abnormal; Notable for the following:       Result Value   Glucose, Bld 147 (*)    BUN 22 (*)    All other components within normal limits  CBC - Abnormal; Notable for the following:    WBC 16.3 (*)    All other components within normal limits  TROPONIN I   ____________________________________________  EKG  ED ECG REPORT I, [place] N Roxanne Orner, the attending physician, personally viewed and interpreted this ECG.   Date: 08/29/2015  EKG Time: 5:09 AM  Rate: 77  Rhythm: Normal sinus rhythm  Axis: Normal  Intervals: Normal  ST&T Change: None  ____________________________________________  RADIOLOGY I,  N Shameek Nyquist, personally viewed and evaluated these images (plain radiographs) as part of my medical decision making, as well as reviewing the written report by the radiologist.  Dg Chest 2 View  Result Date: 08/29/2015 CLINICAL DATA:  82 year old male with shortness of breath EXAM: CHEST  2 VIEW COMPARISON:  Chest radiograph dated 09/18/2014 FINDINGS: Two views of the chest demonstrate linear bibasilar platelike atelectasis/ scarring. No focal consolidation, pleural effusion, pneumothorax. The cardiac silhouette is within normal limits. No acute osseous pathology. IMPRESSION: No active cardiopulmonary disease. Electronically Signed   By: Elgie Collard M.D.   On: 08/29/2015 05:30      Procedures   ____________________________________________   INITIAL IMPRESSION / ASSESSMENT AND PLAN / ED COURSE  Pertinent labs & imaging results that were available during my care of the patient were reviewed by me and considered in my medical  decision making (see chart for details).  Given history physical exam will perform CT scan of the abdomen and pelvis. Patient's care transferred to Dr. Huel Cote  Clinical Course    ____________________________________________  FINAL CLINICAL IMPRESSION(S) / ED DIAGNOSES  Final diagnoses:  Nausea vomiting and diarrhea     MEDICATIONS GIVEN DURING THIS VISIT:  Medications  ondansetron (ZOFRAN) injection 4 mg (4 mg Intravenous Given 08/29/15 0555)  sodium chloride 0.9 % bolus 1,000 mL (1,000 mLs Intravenous New Bag/Given 08/29/15 0633)  diatrizoate meglumine-sodium (GASTROGRAFIN) 66-10 % solution 15 mL (15 mLs Oral Given 08/29/15 0656)     NEW OUTPATIENT MEDICATIONS STARTED DURING THIS VISIT:  New Prescriptions   No medications on file      Note:  This document was prepared using Dragon voice recognition software and may include unintentional dictation errors.    Darci Current, MD 08/29/15 [phone]

## 2015-08-29 NOTE — ED Notes (Signed)

## 2015-08-29 NOTE — ED Provider Notes (Signed)
-----------------------------------------   12:22 PM on 08/29/2015 -----------------------------------------   Blood pressure (!) 113/56, pulse 72, temperature 98.3 F (36.8 C), temperature source Oral, resp. rate 20, height 5\' 7"  (1.702 m), weight 213 lb (96.6 kg), SpO2 95 %.  Assuming care from Dr. Manson Passey.  In short, Kyle Wells is a 83 y.o. male with a chief complaint of Emesis; Diarrhea; and Shortness of Breath .  Refer to the original H&P for additional details.  The current plan of care is to *following results of the abdominal pelvic CT with contrast which did not show any obvious surgical cause for his pain. He has some findings of an ileus or enteritis which seems consistent with a diagnosis of gastroenteritis. The patient was given an additional dose of IV Phenergan and IV fluids and did not have any emesis over the last hour plus of his stay. Reexamination of his abdomen shows no peritoneal signs and I felt he could be treated on an outpatient basis. Prescriptions for Zofran, Phenergan tablets, and Phenergan suppositories to use as needed. Return here especially for increased abdominal pain, fever, bloody stool, bloody emesis or any other new concerns  Patient was advised to return immediately if condition worsens. Patient was advised to follow up with their primary care physician or other specialized physicians involved in their outpatient care. The patient and/or family member/power of attorney had laboratory results reviewed at the bedside. All questions and concerns were addressed and appropriate discharge instructions were distributed by the nursing staff. Jennye Moccasin, MD 08/29/15 [phone]

## 2015-11-02 DIAGNOSIS — Z9989 Dependence on other enabling machines and devices: Secondary | ICD-10-CM

## 2015-11-02 DIAGNOSIS — G4733 Obstructive sleep apnea (adult) (pediatric): Secondary | ICD-10-CM | POA: Insufficient documentation

## 2016-01-26 ENCOUNTER — Inpatient Hospital Stay: Payer: Medicare Other

## 2016-01-26 ENCOUNTER — Emergency Department: Payer: Medicare Other

## 2016-01-26 ENCOUNTER — Inpatient Hospital Stay
Admission: EM | Admit: 2016-01-26 | Discharge: 2016-01-28 | DRG: 390 | Disposition: A | Discharge status: Home / Self Care | Payer: Medicare Other | Attending: Surgery | Admitting: Surgery

## 2016-01-26 DIAGNOSIS — J449 Chronic obstructive pulmonary disease, unspecified: Secondary | ICD-10-CM | POA: Diagnosis present

## 2016-01-26 DIAGNOSIS — F17210 Nicotine dependence, cigarettes, uncomplicated: Secondary | ICD-10-CM | POA: Diagnosis present

## 2016-01-26 DIAGNOSIS — Z825 Family history of asthma and other chronic lower respiratory diseases: Secondary | ICD-10-CM

## 2016-01-26 DIAGNOSIS — Z6833 Body mass index (BMI) 33.0-33.9, adult: Secondary | ICD-10-CM | POA: Diagnosis not present

## 2016-01-26 DIAGNOSIS — E7800 Pure hypercholesterolemia, unspecified: Secondary | ICD-10-CM | POA: Diagnosis present

## 2016-01-26 DIAGNOSIS — K56609 Unspecified intestinal obstruction, unspecified as to partial versus complete obstruction: Secondary | ICD-10-CM | POA: Diagnosis present

## 2016-01-26 DIAGNOSIS — E669 Obesity, unspecified: Secondary | ICD-10-CM | POA: Diagnosis present

## 2016-01-26 DIAGNOSIS — K567 Ileus, unspecified: Secondary | ICD-10-CM | POA: Diagnosis not present

## 2016-01-26 DIAGNOSIS — K5790 Diverticulosis of intestine, part unspecified, without perforation or abscess without bleeding: Secondary | ICD-10-CM | POA: Diagnosis present

## 2016-01-26 DIAGNOSIS — G4733 Obstructive sleep apnea (adult) (pediatric): Secondary | ICD-10-CM | POA: Diagnosis present

## 2016-01-26 DIAGNOSIS — Z4659 Encounter for fitting and adjustment of other gastrointestinal appliance and device: Secondary | ICD-10-CM

## 2016-01-26 DIAGNOSIS — Z79899 Other long term (current) drug therapy: Secondary | ICD-10-CM | POA: Diagnosis not present

## 2016-01-26 DIAGNOSIS — N400 Enlarged prostate without lower urinary tract symptoms: Secondary | ICD-10-CM | POA: Diagnosis present

## 2016-01-26 DIAGNOSIS — Z8249 Family history of ischemic heart disease and other diseases of the circulatory system: Secondary | ICD-10-CM

## 2016-01-26 DIAGNOSIS — R109 Unspecified abdominal pain: Secondary | ICD-10-CM | POA: Diagnosis not present

## 2016-01-26 DIAGNOSIS — Z7951 Long term (current) use of inhaled steroids: Secondary | ICD-10-CM | POA: Diagnosis not present

## 2016-01-26 HISTORY — DX: Impingement syndrome of right shoulder: M75.41

## 2016-01-26 HISTORY — DX: Unspecified intestinal obstruction, unspecified as to partial versus complete obstruction: K56.609

## 2016-01-26 HISTORY — DX: Obstructive sleep apnea (adult) (pediatric): G47.33

## 2016-01-26 HISTORY — DX: Benign prostatic hyperplasia without lower urinary tract symptoms: N40.0

## 2016-01-26 HISTORY — DX: Pure hypercholesterolemia, unspecified: E78.00

## 2016-01-26 LAB — URINALYSIS, COMPLETE (UACMP) WITH MICROSCOPIC
Bilirubin Urine: NEGATIVE
GLUCOSE, UA: 50 mg/dL — AB
HGB URINE DIPSTICK: NEGATIVE
Ketones, ur: 5 mg/dL — AB
Leukocytes, UA: NEGATIVE
NITRITE: NEGATIVE
PH: 5 (ref 5.0–8.0)
Protein, ur: NEGATIVE mg/dL
Specific Gravity, Urine: >1.046 — ABNORMAL HIGH (ref 1.005–1.030)

## 2016-01-26 LAB — COMPREHENSIVE METABOLIC PANEL
ALK PHOS: 57 U/L (ref 38–126)
ALT: 24 U/L (ref 17–63)
ANION GAP: 6 (ref 5–15)
AST: 30 U/L (ref 15–41)
Albumin: 4.1 g/dL (ref 3.5–5.0)
BUN: 26 mg/dL — ABNORMAL HIGH (ref 6–20)
CALCIUM: 9.3 mg/dL (ref 8.9–10.3)
CHLORIDE: 104 mmol/L (ref 101–111)
CO2: 28 mmol/L (ref 22–32)
CREATININE: 0.85 mg/dL (ref 0.61–1.24)
Glucose, Bld: 185 mg/dL — ABNORMAL HIGH (ref 65–99)
Potassium: 3.8 mmol/L (ref 3.5–5.1)
SODIUM: 138 mmol/L (ref 135–145)
Total Bilirubin: 1.4 mg/dL — ABNORMAL HIGH (ref 0.3–1.2)
Total Protein: 7.8 g/dL (ref 6.5–8.1)

## 2016-01-26 LAB — CBC
HCT: 47.3 % (ref 40.0–52.0)
Hemoglobin: 16.3 g/dL (ref 13.0–18.0)
MCH: 31.6 pg (ref 26.0–34.0)
MCHC: 34.5 g/dL (ref 32.0–36.0)
MCV: 91.8 fL (ref 80.0–100.0)
PLATELETS: 189 10*3/uL (ref 150–440)
RBC: 5.16 MIL/uL (ref 4.40–5.90)
RDW: 12.9 % (ref 11.5–14.5)
WBC: 14.8 10*3/uL — ABNORMAL HIGH (ref 3.8–10.6)

## 2016-01-26 LAB — TROPONIN I: Troponin I: <0.03 ng/mL (ref <0.03)

## 2016-01-26 LAB — LIPASE, BLOOD: LIPASE: 14 U/L (ref 11–51)

## 2016-01-26 MED ORDER — IOPAMIDOL (ISOVUE-300) INJECTION 61%
30.0000 mL | Freq: Once | INTRAVENOUS | Status: AC | PRN
  Administered 2016-01-26: 30 mL via ORAL

## 2016-01-26 MED ORDER — ONDANSETRON HCL 4 MG/2ML IJ SOLN: 4.0000 mg | Freq: Four times a day (QID) | INTRAMUSCULAR | Status: DC | PRN

## 2016-01-26 MED ORDER — PROMETHAZINE HCL 25 MG/ML IJ SOLN
INTRAMUSCULAR | Status: AC
  Filled 2016-01-26: qty 1

## 2016-01-26 MED ORDER — ENOXAPARIN SODIUM 40 MG/0.4ML ~~LOC~~ SOLN
40.0000 mg | SUBCUTANEOUS | Status: DC
  Administered 2016-01-26 – 2016-01-27 (×2): 40 mg via SUBCUTANEOUS
  Filled 2016-01-26 (×2): qty 0.4

## 2016-01-26 MED ORDER — MONTELUKAST SODIUM 10 MG PO TABS
10.0000 mg | ORAL_TABLET | Freq: Every day | ORAL | Status: DC
Start: 2016-01-26 — End: 2016-01-28
  Administered 2016-01-26 – 2016-01-28 (×3): 10 mg via ORAL
  Filled 2016-01-26 (×3): qty 1

## 2016-01-26 MED ORDER — TIOTROPIUM BROMIDE MONOHYDRATE 18 MCG IN CAPS
18.0000 ug | ORAL_CAPSULE | Freq: Every day | RESPIRATORY_TRACT | Status: DC
  Administered 2016-01-26 – 2016-01-28 (×3): 18 ug via RESPIRATORY_TRACT
  Filled 2016-01-26: qty 5

## 2016-01-26 MED ORDER — HEPARIN SODIUM (PORCINE) 5000 UNIT/ML IJ SOLN: 5000.0000 [IU] | Freq: Three times a day (TID) | INTRAMUSCULAR | Status: DC

## 2016-01-26 MED ORDER — SODIUM CHLORIDE 0.9 % IV BOLUS (SEPSIS)
1000.0000 mL | Freq: Once | INTRAVENOUS | Status: AC
  Administered 2016-01-26: 1000 mL via INTRAVENOUS

## 2016-01-26 MED ORDER — HYDROMORPHONE HCL 1 MG/ML IJ SOLN: 0.5000 mg | INTRAMUSCULAR | Status: DC | PRN

## 2016-01-26 MED ORDER — IPRATROPIUM-ALBUTEROL 0.5-2.5 (3) MG/3ML IN SOLN
3.0000 mL | Freq: Once | RESPIRATORY_TRACT | Status: AC
  Administered 2016-01-26: 3 mL via RESPIRATORY_TRACT
  Filled 2016-01-26: qty 3

## 2016-01-26 MED ORDER — MOMETASONE FURO-FORMOTEROL FUM 200-5 MCG/ACT IN AERO
2.0000 | INHALATION_SPRAY | Freq: Two times a day (BID) | RESPIRATORY_TRACT | Status: DC
  Administered 2016-01-26 – 2016-01-28 (×5): 2 via RESPIRATORY_TRACT
  Filled 2016-01-26: qty 8.8

## 2016-01-26 MED ORDER — ONDANSETRON 4 MG PO TBDP: 4.0000 mg | ORAL_TABLET | Freq: Four times a day (QID) | ORAL | Status: DC | PRN

## 2016-01-26 MED ORDER — METOCLOPRAMIDE HCL 5 MG/ML IJ SOLN
INTRAMUSCULAR | Status: AC
  Administered 2016-01-26: 10 mg via INTRAVENOUS
  Filled 2016-01-26: qty 2

## 2016-01-26 MED ORDER — PANTOPRAZOLE SODIUM 40 MG IV SOLR
40.0000 mg | Freq: Every day | INTRAVENOUS | Status: DC
  Administered 2016-01-26 – 2016-01-27 (×2): 40 mg via INTRAVENOUS
  Filled 2016-01-26 (×2): qty 40

## 2016-01-26 MED ORDER — ONDANSETRON HCL 4 MG/2ML IJ SOLN
INTRAMUSCULAR | Status: AC
Start: 2016-01-26 — End: 2016-01-26
  Administered 2016-01-26: 4 mg via INTRAVENOUS
  Filled 2016-01-26: qty 2

## 2016-01-26 MED ORDER — ONDANSETRON HCL 4 MG/2ML IJ SOLN
4.0000 mg | Freq: Once | INTRAMUSCULAR | Status: AC
  Administered 2016-01-26: 4 mg via INTRAVENOUS

## 2016-01-26 MED ORDER — PROMETHAZINE HCL 25 MG/ML IJ SOLN
12.5000 mg | Freq: Once | INTRAMUSCULAR | Status: AC
  Administered 2016-01-26: 12.5 mg via INTRAVENOUS

## 2016-01-26 MED ORDER — ENOXAPARIN SODIUM 30 MG/0.3ML ~~LOC~~ SOLN: 30.0000 mg | SUBCUTANEOUS | Status: DC

## 2016-01-26 MED ORDER — KETOROLAC TROMETHAMINE 15 MG/ML IJ SOLN
15.0000 mg | Freq: Four times a day (QID) | INTRAMUSCULAR | Status: DC | PRN
  Administered 2016-01-26 – 2016-01-27 (×2): 15 mg via INTRAVENOUS
  Filled 2016-01-26 (×2): qty 1

## 2016-01-26 MED ORDER — SODIUM CHLORIDE 0.9 % IV SOLN
INTRAVENOUS | Status: DC
  Administered 2016-01-26 (×2): via INTRAVENOUS

## 2016-01-26 MED ORDER — METOCLOPRAMIDE HCL 5 MG/ML IJ SOLN
10.0000 mg | Freq: Once | INTRAMUSCULAR | Status: AC
  Administered 2016-01-26: 10 mg via INTRAVENOUS

## 2016-01-26 MED ORDER — ALBUTEROL SULFATE (2.5 MG/3ML) 0.083% IN NEBU: 2.5000 mg | INHALATION_SOLUTION | Freq: Four times a day (QID) | RESPIRATORY_TRACT | Status: DC | PRN

## 2016-01-26 MED ORDER — IOPAMIDOL (ISOVUE-300) INJECTION 61%
100.0000 mL | Freq: Once | INTRAVENOUS | Status: AC | PRN
  Administered 2016-01-26: 100 mL via INTRAVENOUS

## 2016-01-26 NOTE — ED Notes (Signed)
Patient had small emesis resembling coffee ground in color/consistency. MD informed. MD Don PerkingVeronese to room. MD tested vomit - positive for blood

## 2016-01-26 NOTE — ED Notes (Signed)
Patient transported to CT 

## 2016-01-26 NOTE — H&P (Signed)
Date of Admission:  01/26/2016  Reason for Admission:  Small bowel obstruction  History of Present Illness: Kyle Wells is a 82 y.o. male with a history of COPD on 3L  and OSA on CPAP, who presents with a 2-day history of abdominal discomfort, associated with distention, nausea, and dry heaving.  He has had two prior episodes of small bowel obstruction, though no abdominal surgical history.  His last episode was on 08/2014 which was treated conservatively with NG tube decompression and he was discharged within 2-3 days.  This time, he reports he has not had flatus since yesterday and had a bowel movement yesterday.  He denies significant pain, but does have diffuse discomfort over his abdomen due to the distention.  This distention is also causing mild shortness of breath above his baseline, but has not had any desaturation events.  Denies any fevers, chills, chest pain, blood in the stool, dysuria, or hematuria.   Past Medical History: Past Medical History:  Diagnosis Date  . Asthma   . BPH (benign prostatic hyperplasia)   . COPD (chronic obstructive pulmonary disease) (HCC)   . Hypercholesterolemia   . Impingement syndrome of right shoulder   . OSA (obstructive sleep apnea)   . Small bowel obstruction      Past Surgical History: Past Surgical History:  Procedure Laterality Date  . PROSTATE BIOPSY      Home Medications: Prior to Admission medications   Medication Sig Start Date End Date Taking? Authorizing Provider  ADVAIR DISKUS 500-50 MCG/DOSE AEPB Inhale 2 puffs into the lungs daily. 09/12/14  Yes Historical Provider, MD  albuterol (PROVENTIL) (2.5 MG/3ML) 0.083% nebulizer solution Inhale 3 mLs into the lungs every 6 (six) hours. 02/21/14  Yes Historical Provider, MD  finasteride (PROSCAR) 5 MG tablet Take 1 tablet (5 mg total) by mouth daily. 09/21/14  Yes Lattie Hawichard E Cooper, MD  montelukast (SINGULAIR) 10 MG tablet Take 1 tablet by mouth daily. 09/12/14  Yes Historical  Provider, MD  Multiple Vitamins-Minerals (MULTIVITAMIN & MINERAL PO) Take 1 tablet by mouth daily. 11/11/08  Yes Historical Provider, MD  Naproxen Sodium (ALEVE) 220 MG CAPS Take 1 capsule by mouth every 6 (six) hours as needed (headache, mild pain).   Yes Historical Provider, MD  PROAIR HFA 108 (90 BASE) MCG/ACT inhaler Inhale 2 puffs into the lungs 2 (two) times daily. 09/12/14  Yes Historical Provider, MD  ranitidine (ZANTAC) 150 MG tablet Take 150 mg by mouth 2 (two) times daily as needed for heartburn.   Yes Historical Provider, MD  SPIRIVA HANDIHALER 18 MCG inhalation capsule Place 2 puffs into inhaler and inhale daily. 09/13/14  Yes Historical Provider, MD    Allergies: Allergies  Allergen Reactions  . Roflumilast Other (See Comments)    insomnia   [Onset: 11/29/2010]  . Zileuton Hives    [Onset: 10/27/2009]    Social History:  Has a long smoking history, but currently smokes 2 cigarettes per day. He has never used smokeless tobacco. He reports that he does not drink alcohol or use drugs.   Family History: Family History  Problem Relation Age of Onset  . Asthma Father   . Heart attack Father     Review of Systems: Review of Systems  Constitutional: Negative for chills and fever.  HENT: Negative for hearing loss.   Eyes: Negative for blurred vision.  Respiratory: Positive for shortness of breath. Negative for cough.   Cardiovascular: Negative for chest pain.  Gastrointestinal: Positive for abdominal pain, nausea and  vomiting. Negative for heartburn.  Genitourinary: Negative for dysuria and frequency.  Musculoskeletal: Negative for myalgias.  Skin: Negative for rash.  Neurological: Negative for dizziness.  Psychiatric/Behavioral: Negative for depression.  All other systems reviewed and are negative.   Physical Exam BP 135/80   Pulse 90   Temp 97.4 F (36.3 C) (Oral)   Resp 19   Ht 5\' 7"  (1.702 m)   Wt 97.5 kg (215 lb)   SpO2 97%   BMI 33.67 kg/m   CONSTITUTIONAL: No acute distress HEENT:  Normocephalic, atraumatic, extraocular motion intact. NECK: Trachea is midline, and there is no jugular venous distension.  RESPIRATORY:  Lungs are clear, and breath sounds are equal bilaterally. Normal respiratory effort without pathologic use of accessory muscles. CARDIOVASCULAR: Heart is regular without murmurs, gallops, or rubs. GI: The abdomen is soft, distended, non-tender to palpation. There were no palpable masses.  MUSCULOSKELETAL:  Normal muscle strength and tone in all four extremities.  No peripheral edema or cyanosis. SKIN: Skin turgor is normal. There are no pathologic skin lesions.  NEUROLOGIC:  Motor and sensation is grossly normal.  Cranial nerves are grossly intact. PSYCH:  Alert and oriented to person, place and time. Affect is normal.  Laboratory Analysis: Results for orders placed or performed during the hospital encounter of 01/26/16 (from the past 24 hour(s))  CBC     Status: Abnormal   Collection Time: 01/26/16  3:30 AM  Result Value Ref Range   WBC 14.8 (H) 3.8 - 10.6 K/uL   RBC 5.16 4.40 - 5.90 MIL/uL   Hemoglobin 16.3 13.0 - 18.0 g/dL   HCT 16.1 09.6 - 04.5 %   MCV 91.8 80.0 - 100.0 fL   MCH 31.6 26.0 - 34.0 pg   MCHC 34.5 32.0 - 36.0 g/dL   RDW 40.9 81.1 - 91.4 %   Platelets 189 150 - 440 K/uL  Comprehensive metabolic panel     Status: Abnormal   Collection Time: 01/26/16  3:30 AM  Result Value Ref Range   Sodium 138 135 - 145 mmol/L   Potassium 3.8 3.5 - 5.1 mmol/L   Chloride 104 101 - 111 mmol/L   CO2 28 22 - 32 mmol/L   Glucose, Bld 185 (H) 65 - 99 mg/dL   BUN 26 (H) 6 - 20 mg/dL   Creatinine, Ser 7.82 0.61 - 1.24 mg/dL   Calcium 9.3 8.9 - 95.6 mg/dL   Total Protein 7.8 6.5 - 8.1 g/dL   Albumin 4.1 3.5 - 5.0 g/dL   AST 30 15 - 41 U/L   ALT 24 17 - 63 U/L   Alkaline Phosphatase 57 38 - 126 U/L   Total Bilirubin 1.4 (H) 0.3 - 1.2 mg/dL   GFR calc non Af Amer >60 >60 mL/min   GFR calc Af Amer >60 >60  mL/min   Anion gap 6 5 - 15  Lipase, blood     Status: None   Collection Time: 01/26/16  3:30 AM  Result Value Ref Range   Lipase 14 11 - 51 U/L  Troponin I     Status: None   Collection Time: 01/26/16  3:30 AM  Result Value Ref Range   Troponin I <0.03 <0.03 ng/mL    Imaging: Ct Abdomen Pelvis W Contrast  Result Date: 01/26/2016 CLINICAL DATA:  Nausea and abdominal distention. EXAM: CT ABDOMEN AND PELVIS WITH CONTRAST TECHNIQUE: Multidetector CT imaging of the abdomen and pelvis was performed using the standard protocol following bolus administration of intravenous  contrast. CONTRAST:  100mL ISOVUE-300 IOPAMIDOL (ISOVUE-300) INJECTION 61% COMPARISON:  CT abdomen pelvis 08/29/2015 FINDINGS: Lower chest: No pulmonary nodules. No visible pleural or pericardial effusion. Hepatobiliary: Normal hepatic size and contours without focal liver lesion. No perihepatic ascites. No intra- or extrahepatic biliary dilatation. Normal gallbladder. Pancreas: Normal pancreatic contours and enhancement. No peripancreatic fluid collection or pancreatic ductal dilatation. Spleen: Normal. Adrenals/Urinary Tract: Normal adrenal glands. No hydronephrosis or solid renal mass. Stomach/Bowel: There is severe rectosigmoid diverticulosis without evidence of acute inflammation. There is also diverticulosis of the descending colon without acute inflammation. There are multiple mildly dilated loops of small bowel within the mid to lower abdomen. No clear transition point is identified. Normal appendix. Vascular/Lymphatic: There is atherosclerotic calcification of the non aneurysmal abdominal aorta. No abdominal or pelvic adenopathy. Reproductive: Prostate is enlarged, measuring 5.7 cm transverse. Seminal vesicles are normal. Musculoskeletal: No lytic or blastic osseous lesion. Normal visualized extrathoracic and extraperitoneal soft tissues. Other: No contributory non-categorized findings. IMPRESSION: 1. Multiple mildly dilated  loops of small bowel within the central abdomen without a clear transition point. This may indicate early small bowel obstruction or adynamic ileus. 2. No other acute abnormality of the abdomen or pelvis. 3. Extensive descending colonic and rectosigmoid diverticulosis without acute inflammation. 4. Aortic atherosclerosis. Electronically Signed   By: Deatra RobinsonKevin  Herman M.D.   On: 01/26/2016 04:49   Dg Chest Portable 1 View  Result Date: 01/26/2016 CLINICAL DATA:  Nausea, abdominal distention and dyspnea EXAM: PORTABLE CHEST 1 VIEW COMPARISON:  08/29/2015 FINDINGS: The cardiac silhouette is borderline enlarged. The aorta is not aneurysmal. Platelike atelectasis in the left mid lung with subsegmental atelectasis in the right lung base. No acute osseous abnormality. No effusion or pneumothorax. No overt pulmonary edema. IMPRESSION: Atelectasis within both lungs without evidence active cardiopulmonary disease. Electronically Signed   By: Tollie Ethavid  Kwon M.D.   On: 01/26/2016 04:08    Assessment and Plan: This is a 82 y.o. male who presents with an episode of small bowel obstruction.  I have personally reviewed the patient's laboratory and imaging studies.  He does have a dilated stomach and mid small bowel loops, with no clear transition point, though his small bowel around distal ileum is decompressed.  He denies any surgical history.  He will be admitted to the General Surgery service.  NG tube will be placed in the Emergency Room.  He will be NPO with gentle IV fluid hydration given his pulmonary disease.  He will have his COPD medications/inhalers and will have his CPAP ordered for bedtime.  Appropriate DVT and GI prophylaxis, nausea, and pain control.  The patient does understand that conservative management for his small bowel obstruction will be attempted first, but that there is the possibility of him requiring surgery if there is no improvement.  At this point there is no urgency for a laparotomy or other  procedures.   Howie IllJose Luis Casanova Schurman, MD [facility]

## 2016-01-26 NOTE — Progress Notes (Signed)
Anticoagulation monitoring(Lovenox):  82yo  male ordered Lovenox 30 mg Q24h for DVT prevention.  Filed Weights   01/26/16 0321 01/26/16 0715  Weight: 215 lb (97.5 kg) 218 lb 9.6 oz (99.2 kg)   BMI     Lab Results  Component Value Date   CREATININE 0.85 01/26/2016   CREATININE 0.86 08/29/2015   CREATININE 0.98 06/09/2015   Estimated Creatinine Clearance: 76.5 mL/min (by C-G formula based on SCr of 0.85 mg/dL). Hemoglobin & Hematocrit     Component Value Date/Time   HGB 16.3 01/26/2016 0330   HGB 15.3 08/02/2013 0315   HCT 47.3 01/26/2016 0330   HCT 47.8 08/02/2013 0315     Per Protocol for Patient with estCrcl > 30 ml/min and BMI < 40, will transition to Lovenox 40 mg Q24h     Clovia CuffLisa Janna Oak, PharmD, BCPS 01/26/2016 7:37 AM

## 2016-01-26 NOTE — Progress Notes (Signed)
[age] year old man with concern for ileus versus strep small bowel obstruction. He was admitted by one of my partners earlier this morning and had NG tube placed she's had approximately 1500 mL, out of that throughout the course of today. Patient states that he no longer feels bloated in his belly feels much better and no further pain in the abdomen. He denies passing any gas at this time.  Vitals:   01/26/16 0715 01/26/16 1313  BP: 118/64 (!) 124/50  Pulse: 82 78  Resp: 20 18  Temp: 98.7 F (37.1 C) 98.7 F (37.1 C)   PE:  Gen: NAD Abd: obese, mildly distended, non tender, small reducible umbilical hernia Ext: no edema  CBC Latest Ref Rng & Units 01/26/2016 08/29/2015 06/09/2015  WBC 3.8 - 10.6 K/uL 14.8(H) 16.3(H) 8.3  Hemoglobin 13.0 - 18.0 g/dL 16.116.3 09.616.6 04.515.6  Hematocrit 40.0 - 52.0 % 47.3 48.0 45.6  Platelets 150 - 440 K/uL 189 200 200   CMP Latest Ref Rng & Units 01/26/2016 08/29/2015 06/09/2015  Glucose 65 - 99 mg/dL 409(W185(H) 119(J147(H) 478(G105(H)  BUN 6 - 20 mg/dL 95(A26(H) 21(H22(H) 08(M23(H)  Creatinine 0.61 - 1.24 mg/dL 5.780.85 4.690.86 6.290.98  Sodium 135 - 145 mmol/L 138 139 138  Potassium 3.5 - 5.1 mmol/L 3.8 3.9 4.0  Chloride 101 - 111 mmol/L 104 104 105  CO2 22 - 32 mmol/L 28 26 24   Calcium 8.9 - 10.3 mg/dL 9.3 9.4 9.4  Total Protein 6.5 - 8.1 g/dL 7.8 - 7.1  Total Bilirubin 0.3 - 1.2 mg/dL 5.2(W1.4(H) - 0.5  Alkaline Phos 38 - 126 U/L 57 - 60  AST 15 - 41 U/L 30 - 35  ALT 17 - 63 U/L 24 - 7127   A/P:  82 year old man with concern for ileus versus strep small bowel obstruction.  Continue NG tube and encourage ambulation able to ambulate around clamp tube and then will re-connected when he is back from walking. Also discussed with him the importance of staying hydrated and good high-fiber diet when he is out of the hospital.  They understand that he will need a couple more days a the NG tube and be passing gas before the tube comes out and he is able to take liquids.

## 2016-01-26 NOTE — ED Notes (Signed)
Patient c/o nausea, distended abdomen, SOB beginning Wednesday. Pt reports hx of COPD and previous bowel  Obstruction.

## 2016-01-26 NOTE — ED Provider Notes (Signed)
[facility] Emergency Department Provider Note  ____________________________________________  Time seen: Approximately 3:50 AM  I have reviewed the triage vital signs and the nursing notes.   HISTORY  Chief Complaint Nausea   HPI Kyle Wells is a 82 y.o. male with a history of 2 prior episodes of SBO, COPD, an active smoker who presents for evaluation of nausea and abdominal distention. Patient reports 2 days of progressively worsening abdominal distention. He denies abdominal pain. He has had significant nausea but no vomiting. Last bowel movement was 2 days ago. Not passing flatus for 24 hours. Symptoms are similar to prior SBO. Patient has never had abdominal surgeries. Patient is also complaining of mildly worsening shortness of breath. Has a history of COPD on 3 L nasal cannula and feel that his shortness of breath is due to his abdominal distention. He has been using his inhaler at home. He denies cough, congestion, sore throat, fever, chest pain, body aches.  Past Medical History:  Diagnosis Date  . Asthma   . BPH (benign prostatic hyperplasia)   . COPD (chronic obstructive pulmonary disease) (HCC)   . Hypercholesterolemia   . Impingement syndrome of right shoulder   . OSA (obstructive sleep apnea)   . Small bowel obstruction     Patient Active Problem List   Diagnosis Date Noted  . Small bowel obstruction 01/26/2016  . COPD, mild (HCC)   . SBO (small bowel obstruction)   . Intestinal obstruction 09/19/2014    Past Surgical History:  Procedure Laterality Date  . PROSTATE BIOPSY      Prior to Admission medications   Medication Sig Start Date End Date Taking? Authorizing Provider  ADVAIR DISKUS 500-50 MCG/DOSE AEPB Inhale 2 puffs into the lungs daily. 09/12/14  Yes Historical Provider, MD  albuterol (PROVENTIL) (2.5 MG/3ML) 0.083% nebulizer solution Inhale 3 mLs into the lungs every 6 (six) hours. 02/21/14  Yes Historical Provider, MD    finasteride (PROSCAR) 5 MG tablet Take 1 tablet (5 mg total) by mouth daily. 09/21/14  Yes Lattie Haw, MD  montelukast (SINGULAIR) 10 MG tablet Take 1 tablet by mouth daily. 09/12/14  Yes Historical Provider, MD  Multiple Vitamins-Minerals (MULTIVITAMIN & MINERAL PO) Take 1 tablet by mouth daily. 11/11/08  Yes Historical Provider, MD  Naproxen Sodium (ALEVE) 220 MG CAPS Take 1 capsule by mouth every 6 (six) hours as needed (headache, mild pain).   Yes Historical Provider, MD  PROAIR HFA 108 (90 BASE) MCG/ACT inhaler Inhale 2 puffs into the lungs 2 (two) times daily. 09/12/14  Yes Historical Provider, MD  ranitidine (ZANTAC) 150 MG tablet Take 150 mg by mouth 2 (two) times daily as needed for heartburn.   Yes Historical Provider, MD  SPIRIVA HANDIHALER 18 MCG inhalation capsule Place 2 puffs into inhaler and inhale daily. 09/13/14  Yes Historical Provider, MD    Allergies Roflumilast and Zileuton  Family History  Problem Relation Age of Onset  . Asthma Father   . Heart attack Father     Social History Social History  Substance Use Topics  . Smoking status: Current Some Day Smoker    Packs/day: 0.10    Types: Cigarettes  . Smokeless tobacco: Never Used  . Alcohol use No    Review of Systems  Constitutional: Negative for fever. Eyes: Negative for visual changes. ENT: Negative for sore throat. Neck: No neck pain  Cardiovascular: Negative for chest pain. Respiratory: + shortness of breath. Gastrointestinal: + abdominal distention and nausea. No  abdominal pain, vomiting or diarrhea. Genitourinary: Negative for dysuria. Musculoskeletal: Negative for back pain. Skin: Negative for rash. Neurological: Negative for headaches, weakness or numbness. Psych: No SI or HI  ____________________________________________   PHYSICAL EXAM:  VITAL SIGNS: ED Triage Vitals  Enc Vitals Group     BP 01/26/16 0320 (!) 148/77     Pulse Rate 01/26/16 0320 86     Resp 01/26/16 0320 20      Temp 01/26/16 0320 97.4 F (36.3 C)     Temp Source 01/26/16 0320 Oral     SpO2 01/26/16 0320 100 %     Weight 01/26/16 0321 215 lb (97.5 kg)     Height 01/26/16 0321 5\' 7"  (1.702 m)     Head Circumference --      Peak Flow --      Pain Score 01/26/16 0343 9     Pain Loc --      Pain Edu? --      Excl. in GC? --     Constitutional: Alert and oriented. Well appearing and in no apparent distress. HEENT:      Head: Normocephalic and atraumatic.         Eyes: Conjunctivae are normal. Sclera is non-icteric. EOMI. PERRL      Mouth/Throat: Mucous membranes are moist.       Neck: Supple with no signs of meningismus. Cardiovascular: Regular rate and rhythm. No murmurs, gallops, or rubs. 2+ symmetrical distal pulses are present in all extremities. No JVD. Respiratory: Mildly increased work of breathing, respiratory rate in the low 20s, satting 94-96% on 3 L nasal cannula, mildly diminished air movement bilaterally with no wheezing or crackles Gastrointestinal: Distended, non tender, + bowel sounds Musculoskeletal: Nontender with normal range of motion in all extremities. No edema, cyanosis, or erythema of extremities. Neurologic: Normal speech and language. Face is symmetric. Moving all extremities. No gross focal neurologic deficits are appreciated. Skin: Skin is warm, dry and intact. No rash noted. Psychiatric: Mood and affect are normal. Speech and behavior are normal.  ____________________________________________   LABS (all labs ordered are listed, but only abnormal results are displayed)  Labs Reviewed  CBC - Abnormal; Notable for the following:       Result Value   WBC 14.8 (*)    All other components within normal limits  COMPREHENSIVE METABOLIC PANEL - Abnormal; Notable for the following:    Glucose, Bld 185 (*)    BUN 26 (*)    Total Bilirubin 1.4 (*)    All other components within normal limits  LIPASE, BLOOD  TROPONIN I  URINALYSIS, COMPLETE (UACMP) WITH MICROSCOPIC    ____________________________________________  EKG  ED ECG REPORT I, Nita Sicklearolina Amelio Brosky, the attending physician, personally viewed and interpreted this ECG.  Normal sinus rhythm, rate of 85, normal intervals, normal axis, no ST elevations or depressions. ____________________________________________  RADIOLOGY  CT a/p: 1. Multiple mildly dilated loops of small bowel within the central abdomen without a clear transition point. This may indicate early small bowel obstruction or adynamic ileus. 2. No other acute abnormality of the abdomen or pelvis. 3. Extensive descending colonic and rectosigmoid diverticulosis without acute inflammation. 4. Aortic atherosclerosis. ____________________________________________   PROCEDURES  Procedure(s) performed: no Procedures   Critical Care performed:  None ____________________________________________   INITIAL IMPRESSION / ASSESSMENT AND PLAN / ED COURSE  82 y.o. male with a history of 2 prior episodes of SBO, COPD, an active smoker who presents for evaluation of nausea and abdominal distention. Patient  is not passing flatus and has not had a bowel movement for 2 days. His abdomen is distended with positive bowel sounds and non-tender throughout. Presentation concerning for SBO. Patient also with mildly increased work of breathing complaining of shortness of breath. He does have mildly diminished air movement with no wheezing or crackles which is consistent with a mild COPD exacerbation probably from pressure of the abdominal distention. We'll give a couple of DuoNeb treatments. EKG with no evidence of ischemia. Labs are pending.  Clinical Course as of Jan 26 716  Fri Jan 26, 2016  0501 CT concerning for early obstruction. Patient persistently nauseated. We'll place an NG tube. Discussed with Dr. Aleen CampiPiscoya who will come down to evaluate patient for admission.  [CV]    Clinical Course User Index [CV] Nita Sicklearolina Mairi Stagliano, MD    Pertinent labs  & imaging results that were available during my care of the patient were reviewed by me and considered in my medical decision making (see chart for details).    ____________________________________________   FINAL CLINICAL IMPRESSION(S) / ED DIAGNOSES  Final diagnoses:  SBO (small bowel obstruction)      NEW MEDICATIONS STARTED DURING THIS VISIT:  Current Discharge Medication List       Note:  This document was prepared using Dragon voice recognition software and may include unintentional dictation errors.    Nita Sicklearolina Reighan Hipolito, MD 01/26/16 [phone]

## 2016-01-26 NOTE — ED Notes (Signed)
ED Provider at bedside. 

## 2016-01-26 NOTE — Progress Notes (Signed)
Per Dr. Orvis BrillLoflin okay to give patient oral meds.

## 2016-01-26 NOTE — Care Management Important Message (Signed)
Important Message  Patient Details  Name: Kyle MunroJames W Kloos MRN: [id number] Date of Birth: 05/06/1933   Medicare Important Message Given:  Yes    Chapman FitchBOWEN, Jontae Sonier T, RN 01/26/2016, 3:09 PM

## 2016-01-26 NOTE — ED Triage Notes (Signed)
Pt in with co nausea since yest, no vomiting or diarrhea. Denies any abd pain at this time.

## 2016-01-27 ENCOUNTER — Inpatient Hospital Stay: Payer: Medicare Other

## 2016-01-27 DIAGNOSIS — K567 Ileus, unspecified: Secondary | ICD-10-CM

## 2016-01-27 LAB — CBC WITH DIFFERENTIAL/PLATELET
BASOS ABS: 0 10*3/uL (ref 0–0.1)
Basophils Relative: 0 %
Eosinophils Absolute: 0 10*3/uL (ref 0–0.7)
Eosinophils Relative: 0 %
HEMATOCRIT: 43.1 % (ref 40.0–52.0)
HEMOGLOBIN: 14.7 g/dL (ref 13.0–18.0)
LYMPHS PCT: 20 %
Lymphs Abs: 2 10*3/uL (ref 1.0–3.6)
MCH: 31.3 pg (ref 26.0–34.0)
MCHC: 34.2 g/dL (ref 32.0–36.0)
MCV: 91.6 fL (ref 80.0–100.0)
MONO ABS: 0.9 10*3/uL (ref 0.2–1.0)
Monocytes Relative: 9 %
NEUTROS ABS: 7 10*3/uL — AB (ref 1.4–6.5)
NEUTROS PCT: 71 %
Platelets: 177 10*3/uL (ref 150–440)
RBC: 4.7 MIL/uL (ref 4.40–5.90)
RDW: 13.2 % (ref 11.5–14.5)
WBC: 10 10*3/uL (ref 3.8–10.6)

## 2016-01-27 LAB — BASIC METABOLIC PANEL
ANION GAP: 6 (ref 5–15)
BUN: 20 mg/dL (ref 6–20)
CHLORIDE: 108 mmol/L (ref 101–111)
CO2: 26 mmol/L (ref 22–32)
Calcium: 8.2 mg/dL — ABNORMAL LOW (ref 8.9–10.3)
Creatinine, Ser: 0.87 mg/dL (ref 0.61–1.24)
GFR calc Af Amer: >60 mL/min (ref >60)
GFR calc non Af Amer: >60 mL/min (ref >60)
Glucose, Bld: 92 mg/dL (ref 65–99)
POTASSIUM: 3.5 mmol/L (ref 3.5–5.1)
SODIUM: 140 mmol/L (ref 135–145)

## 2016-01-27 LAB — MAGNESIUM: MAGNESIUM: 1.8 mg/dL (ref 1.7–2.4)

## 2016-01-27 MED ORDER — PNEUMOCOCCAL VAC POLYVALENT 25 MCG/0.5ML IJ INJ: 0.5000 mL | INJECTION | INTRAMUSCULAR | Status: DC

## 2016-01-27 MED ORDER — KCL IN DEXTROSE-NACL 20-5-0.45 MEQ/L-%-% IV SOLN
INTRAVENOUS | Status: DC
  Administered 2016-01-27 – 2016-01-28 (×3): via INTRAVENOUS
  Filled 2016-01-27 (×6): qty 1000

## 2016-01-27 MED ORDER — BISACODYL 10 MG RE SUPP
10.0000 mg | Freq: Once | RECTAL | Status: AC
  Administered 2016-01-27: 10 mg via RECTAL
  Filled 2016-01-27: qty 1

## 2016-01-27 MED ORDER — PHENOL 1.4 % MT LIQD
1.0000 | OROMUCOSAL | Status: DC | PRN
  Administered 2016-01-27 (×2): 1 via OROMUCOSAL
  Filled 2016-01-27: qty 177

## 2016-01-27 NOTE — Plan of Care (Signed)
Problem: Fluid Volume: Goal: Ability to maintain a balanced intake and output will improve Outcome: Not Progressing Patient is NPO except ice chips and sips with meds.  Problem: Nutrition: Goal: Adequate nutrition will be maintained Outcome: Not Progressing Patient is NPO except ice chips and sips with meds.

## 2016-01-27 NOTE — Progress Notes (Signed)
Pt refused cpap, says he doesn't need it, he has been sleeping without it already tonight. Pt also states he has "tubes coming out of my nose, it wont work tonight".

## 2016-01-27 NOTE — Progress Notes (Signed)
CC: Ileus Subjective: This patient mid to the hospital for another episode of distention and blockage. He's had a workup in the past and he's had no abdominal surgeries. His CT scan is been personally reviewed. There is considerable diverticulosis.  Today patient feels better he is passing gas but has not had a bowel movement has no nausea or vomiting but has a nasogastric tube in place and denies abdominal pain  Objective: Vital signs in last 24 hours: Temp:  [98.2 F (36.8 C)-98.7 F (37.1 C)] 98.3 F (36.8 C) (12/30 0528) Pulse Rate:  [64-78] 70 (12/30 0528) Resp:  [18-21] 21 (12/30 0528) BP: (106-129)/(50-83) 129/83 (12/30 0528) SpO2:  [95 %-97 %] 95 % (12/30 0528) Last BM Date: 01/25/16  Intake/Output from previous day: 12/29 0701 - 12/30 0700 In: 2184 [I.V.:2184] Out: 2650 [Urine:800; Emesis/NG output:1850] Intake/Output this shift: Total I/O In: -  Out: 300 [Emesis/NG output:300]  Physical exam:  Vital signs are stable and reviewed.  Abdomen is distended tympanitic but nontender Extremities show mild edema nontender calves  Lab Results: CBC   Recent Labs  01/26/16 0330 01/27/16 0447  WBC 14.8* 10.0  HGB 16.3 14.7  HCT 47.3 43.1  PLT 189 177   BMET  Recent Labs  01/26/16 0330 01/27/16 0447  NA 138 140  K 3.8 3.5  CL 104 108  CO2 28 26  GLUCOSE 185* 92  BUN 26* 20  CREATININE 0.85 0.87  CALCIUM 9.3 8.2*   PT/INR No results for input(s): LABPROT, INR in the last 72 hours. ABG No results for input(s): PHART, HCO3 in the last 72 hours.  Invalid input(s): PCO2, PO2  Studies/Results: Dg Abd 1 View  Result Date: 01/26/2016 CLINICAL DATA:  NG tube placement EXAM: ABDOMEN - 1 VIEW COMPARISON:  01/26/2016 FINDINGS: NG tube is in place with the tip in the proximal to mid stomach. IMPRESSION: NG tube tip in the proximal to mid stomach. Electronically Signed   By: Charlett NoseKevin  Dover M.D.   On: 01/26/2016 08:39   Dg Abdomen 1 View  Result Date:  01/26/2016 CLINICAL DATA:  NG tube placement. EXAM: ABDOMEN - 1 VIEW COMPARISON:  CT earlier this day FINDINGS: Tip of the enteric tube projects over the mid chest in the region of the midesophagus. This needs to be advanced greater than 15 cm to be below the diaphragm. There is gaseous gastric distention. Excreted intravenous contrast from recent CT in the renal collecting systems per IMPRESSION: Enteric tube tip in the region of the midesophagus. Repositioning recommended. Electronically Signed   By: Rubye OaksMelanie  Ehinger M.D.   On: 01/26/2016 06:55   Ct Abdomen Pelvis W Contrast  Result Date: 01/26/2016 CLINICAL DATA:  Nausea and abdominal distention. EXAM: CT ABDOMEN AND PELVIS WITH CONTRAST TECHNIQUE: Multidetector CT imaging of the abdomen and pelvis was performed using the standard protocol following bolus administration of intravenous contrast. CONTRAST:  100mL ISOVUE-300 IOPAMIDOL (ISOVUE-300) INJECTION 61% COMPARISON:  CT abdomen pelvis 08/29/2015 FINDINGS: Lower chest: No pulmonary nodules. No visible pleural or pericardial effusion. Hepatobiliary: Normal hepatic size and contours without focal liver lesion. No perihepatic ascites. No intra- or extrahepatic biliary dilatation. Normal gallbladder. Pancreas: Normal pancreatic contours and enhancement. No peripancreatic fluid collection or pancreatic ductal dilatation. Spleen: Normal. Adrenals/Urinary Tract: Normal adrenal glands. No hydronephrosis or solid renal mass. Stomach/Bowel: There is severe rectosigmoid diverticulosis without evidence of acute inflammation. There is also diverticulosis of the descending colon without acute inflammation. There are multiple mildly dilated loops of small bowel within the  mid to lower abdomen. No clear transition point is identified. Normal appendix. Vascular/Lymphatic: There is atherosclerotic calcification of the non aneurysmal abdominal aorta. No abdominal or pelvic adenopathy. Reproductive: Prostate is enlarged,  measuring 5.7 cm transverse. Seminal vesicles are normal. Musculoskeletal: No lytic or blastic osseous lesion. Normal visualized extrathoracic and extraperitoneal soft tissues. Other: No contributory non-categorized findings. IMPRESSION: 1. Multiple mildly dilated loops of small bowel within the central abdomen without a clear transition point. This may indicate early small bowel obstruction or adynamic ileus. 2. No other acute abnormality of the abdomen or pelvis. 3. Extensive descending colonic and rectosigmoid diverticulosis without acute inflammation. 4. Aortic atherosclerosis. Electronically Signed   By: Deatra RobinsonKevin  Herman M.D.   On: 01/26/2016 04:49   Dg Chest Portable 1 View  Result Date: 01/26/2016 CLINICAL DATA:  Nausea, abdominal distention and dyspnea EXAM: PORTABLE CHEST 1 VIEW COMPARISON:  08/29/2015 FINDINGS: The cardiac silhouette is borderline enlarged. The aorta is not aneurysmal. Platelike atelectasis in the left mid lung with subsegmental atelectasis in the right lung base. No acute osseous abnormality. No effusion or pneumothorax. No overt pulmonary edema. IMPRESSION: Atelectasis within both lungs without evidence active cardiopulmonary disease. Electronically Signed   By: Tollie Ethavid  Kwon M.D.   On: 01/26/2016 04:08    Anti-infectives: Anti-infectives    None      Assessment/Plan:  CT scan is personally reviewed. Extensive diverticulosis this is a patient who has had multiple recurrences requiring admission on occasion for bowel obstruction but he has not had any abdominal surgery he has considerable diverticulosis and has not had a colonoscopy in several years.  Today I will repeat an abdominal series and consider removing his nasogastric tube. However I do believe he needs further investigation to see why this occurs with some frequency and that may include a colonoscopy I will make that decision as to whether this is done inpatient or outpatient depending on his KUB ordered for this  morning  Lattie Hawichard E Ramy Greth, MD, FACS  01/27/2016

## 2016-01-28 ENCOUNTER — Inpatient Hospital Stay: Payer: Medicare Other

## 2016-01-28 NOTE — Progress Notes (Signed)
CC: SBO Subjective: Patient states he feels well this morning. He states he is passing gas and has no nausea or vomiting gastric tube is in place he denies fevers or chills  Objective: Vital signs in last 24 hours: Temp:  [97.4 F (36.3 C)-97.8 F (36.6 C)] 97.6 F (36.4 C) (12/31 0901) Pulse Rate:  [50-78] 54 (12/31 0901) Resp:  [17-22] 20 (12/31 0901) BP: (99-122)/(57-73) 114/73 (12/31 0901) SpO2:  [92 %-95 %] 93 % (12/31 0901) Last BM Date: 01/27/16  Intake/Output from previous day: 12/30 0701 - 12/31 0700 In: 1830 [I.V.:1830] Out: 1550 [Urine:750; Emesis/NG output:800] Intake/Output this shift: Total I/O In: 326 [I.V.:326] Out: 600 [Urine:600]  Physical exam:  Vital signs are stable reviewed awake alert and oriented. Abdomen is soft non-tender but still distended and slightly tympanitic. Calves are nontender no icterus no jaundice  Lab Results: CBC   Recent Labs  01/26/16 0330 01/27/16 0447  WBC 14.8* 10.0  HGB 16.3 14.7  HCT 47.3 43.1  PLT 189 177   BMET  Recent Labs  01/26/16 0330 01/27/16 0447  NA 138 140  K 3.8 3.5  CL 104 108  CO2 28 26  GLUCOSE 185* 92  BUN 26* 20  CREATININE 0.85 0.87  CALCIUM 9.3 8.2*   PT/INR No results for input(s): LABPROT, INR in the last 72 hours. ABG No results for input(s): PHART, HCO3 in the last 72 hours.  Invalid input(s): PCO2, PO2  Studies/Results: Dg Abd 2 Views  Result Date: 01/27/2016 CLINICAL DATA:  Ileus, NG tube EXAM: ABDOMEN - 2 VIEW COMPARISON:  01/26/2016 FINDINGS: NG tube is in the proximal stomach. Mildly dilated mid and lower abdominal small bowel loops have progressed since prior study and are concerning for small bowel obstruction. Oral contrast material and gas noted within the colon. IMPRESSION: Increasing small bowel dilatation concerning for small bowel obstruction. Electronically Signed   By: Charlett NoseKevin  Dover M.D.   On: 01/27/2016 12:12    Anti-infectives: Anti-infectives    None       Assessment/Plan:  Abdominal films are pending we'll review those films and consider removing the nasogastric tube as he has clinically improved but the etiology of this is still unclear and will require further workup as an outpatient should he go home today.  Lattie Hawichard E Zaiyah Sottile, MD, FACS  01/28/2016

## 2016-01-28 NOTE — Discharge Instructions (Signed)
Resume all home medications Soft diet Call on Tuesday to arrange for follow-up with Dr. Excell Seltzerooper in 2 weeks and Dr. Servando SnareWohl, GI, in 2 weeks for colonoscopy. Resume normal activities

## 2016-01-28 NOTE — Progress Notes (Signed)
Alert and oriented. Vital signs stable . No signs of acute distress. Discharge instructions given. Patient verbalized understanding. No other issues noted at this time.    

## 2016-01-28 NOTE — Plan of Care (Signed)
Problem: Bowel/Gastric: Goal: Will not experience complications related to bowel motility Outcome: Progressing Patient had a bowel movement.

## 2016-01-28 NOTE — Discharge Summary (Signed)
Physician Discharge Summary  Patient ID: Kyle MunroJames W Alicea MRN: [id number] DOB/AGE: June 29, 1933 82 y.o.  Admit date: 01/26/2016 Discharge date: 01/28/2016   Discharge Diagnoses:  Active Problems:   Small bowel obstruction   Procedures:none  Hospital Course:This a patient with a history of recurrent bowel obstructions is been hospitalized multiple times. He presented with signs of bowel obstruction with nausea and bloating. A nasogastric tube was placed and he started passing gas and having bowel movements. He is tolerating a diet at this point and has had no further nausea or vomiting. Be discharged today.  We discussed the need for further workup to identify the cause of these recurrent bowel obstructions as he has never had surgery before. He has also not had a colonoscopy in several years. As an outpatient I will ask him to see Dr. Servando SnareWohl per GI consultation and possible colonoscopy. This may be related to diverticulosis that he does not show signs of a large bowel obstruction and needs probably a small bowel series as well. This will be arranged as an outpatient and seen in the office.  He was advanced his diet slowly through soft diet. No new medications  Consults:none  Disposition: 01-Home or Self Care   Allergies as of 01/28/2016      Reactions   Roflumilast Other (See Comments)   insomnia   [Onset: 11/29/2010]   Zileuton Hives   [Onset: 10/27/2009]      Medication List    TAKE these medications   ADVAIR DISKUS 500-50 MCG/DOSE Aepb Generic drug:  Fluticasone-Salmeterol Inhale 2 puffs into the lungs daily.   ALEVE 220 MG Caps Generic drug:  Naproxen Sodium Take 1 capsule by mouth every 6 (six) hours as needed (headache, mild pain).   finasteride 5 MG tablet Commonly known as:  PROSCAR Take 1 tablet (5 mg total) by mouth daily.   montelukast 10 MG tablet Commonly known as:  SINGULAIR Take 1 tablet by mouth daily.   MULTIVITAMIN & MINERAL PO Take 1 tablet by mouth  daily.   albuterol (2.5 MG/3ML) 0.083% nebulizer solution Commonly known as:  PROVENTIL Inhale 3 mLs into the lungs every 6 (six) hours.   PROAIR HFA 108 (90 Base) MCG/ACT inhaler Generic drug:  albuterol Inhale 2 puffs into the lungs 2 (two) times daily.   ranitidine 150 MG tablet Commonly known as:  ZANTAC Take 150 mg by mouth 2 (two) times daily as needed for heartburn.   SPIRIVA HANDIHALER 18 MCG inhalation capsule Generic drug:  tiotropium Place 2 puffs into inhaler and inhale daily.      Follow-up Information    Danne HarborWALKER III, Letta PateJOHN B, MD Follow up in 2 week(s).   Specialty:  Internal Medicine Contact information: [phone] [facility] [facility] [place] [address] [phone]        Dionne Miloichard Adonte Vanriper, MD Follow up in 2 week(s).   Specialty:  Surgery Contact information: [address] [place] [address] [phone]        Midge Miniumarren Wohl, MD Follow up in 2 week(s).   Specialty:  Gastroenterology Contact information: [address] [place] [address] [phone]           Lattie Hawichard E Krystal Teachey, MD, FACS

## 2016-02-13 ENCOUNTER — Ambulatory Visit (INDEPENDENT_AMBULATORY_CARE_PROVIDER_SITE_OTHER): Payer: Medicare Other | Admitting: Surgery

## 2016-02-13 ENCOUNTER — Encounter: Payer: Self-pay | Admitting: Surgery

## 2016-02-13 VITALS — BP 134/84 | HR 72 | Temp 98.2°F | Ht 66.0 in | Wt 217.6 lb

## 2016-02-13 DIAGNOSIS — K56609 Unspecified intestinal obstruction, unspecified as to partial versus complete obstruction: Secondary | ICD-10-CM | POA: Diagnosis not present

## 2016-02-13 NOTE — Progress Notes (Signed)
Outpatient Surgical Follow Up  02/13/2016  Kyle Wells is an 83 y.o. male.   CC:sbo  HPI: This a patient who is been admitted to the hospital multiple times with signs of a small bowel obstruction which has spontaneously resolved. He has never required surgery for this. Of interest is the fact that the patient has never had abdominal surgery of any kind but has this persistent and recurrent small bowel obstruction. He has had a colonoscopy in the past.  Patient states she's had no episodes at home and is feeling well he's eating well having bowel movements with no nausea vomiting and no abdominal pain.  He has an appointment with Dr. Servando Snare next week for possible repeat colonoscopy.  Past Medical History:  Diagnosis Date  . Asthma   . BPH (benign prostatic hyperplasia)   . COPD (chronic obstructive pulmonary disease) (HCC)   . Hypercholesterolemia   . Impingement syndrome of right shoulder   . OSA (obstructive sleep apnea)   . Small bowel obstruction     Past Surgical History:  Procedure Laterality Date  . PROSTATE BIOPSY      Family History  Problem Relation Age of Onset  . Asthma Father   . Heart attack Father     Social History:  reports that he has been smoking Cigarettes.  He has been smoking about 0.10 packs per day. He has never used smokeless tobacco. He reports that he does not drink alcohol or use drugs.  Allergies:  Allergies  Allergen Reactions  . Zileuton Hives    [Onset: 10/27/2009]  . Roflumilast Other (See Comments)    insomnia   [Onset: 11/29/2010]    Medications reviewed.   Review of Systems:   Review of Systems  Constitutional: Negative.   HENT: Negative.   Eyes: Negative.   Respiratory: Negative.   Cardiovascular: Negative.   Gastrointestinal: Negative for abdominal pain, blood in stool, constipation, diarrhea, heartburn, melena, nausea and vomiting.  Genitourinary: Negative.   Musculoskeletal: Negative.   Skin: Negative.    Neurological: Negative.   Endo/Heme/Allergies: Negative.   Psychiatric/Behavioral: Negative.      Physical Exam:  There were no vitals taken for this visit.  Physical Exam  Constitutional: He is oriented to person, place, and time and well-developed, well-nourished, and in no distress. No distress.  HENT:  Head: Normocephalic and atraumatic.  Eyes: Pupils are equal, round, and reactive to light. Right eye exhibits no discharge. Left eye exhibits no discharge. No scleral icterus.  Neck: Normal range of motion.  Cardiovascular: Normal rate, regular rhythm and normal heart sounds.   Pulmonary/Chest: Effort normal and breath sounds normal. No respiratory distress. He has no wheezes.  Abdominal: Soft. He exhibits distension. There is no tenderness. There is no rebound and no guarding.  Distended and tympanitic but nontender and soft  Musculoskeletal: Normal range of motion. He exhibits edema. He exhibits no tenderness.  Lymphadenopathy:    He has no cervical adenopathy.  Neurological: He is alert and oriented to person, place, and time.  Skin: Skin is warm and dry. No rash noted. He is not diaphoretic. No erythema.  Psychiatric: Mood and affect normal.  Vitals reviewed.     No results found for this or any previous visit (from the past 48 hour(s)). No results found.  Assessment/Plan:  This patient with a history of partial small bowel obstruction requiring hospitalization on 2 separate occasions. He's had no prior abdominal surgery so the typical etiology of adhesions is not likely. We  need to investigate this as it has happened twice. I've arrange for him to see Dr. Servando SnareWohl as an outpatient and that appointment is coming up and he will likely get a colonoscopy. However his symptoms seem to be more small bowel and without a mind of recommended a small bowel follow-through and we will see him back after that. This plan was discussed with the patient and his daughter  Lattie Hawichard E Kyaira Trantham,  MD, FACS

## 2016-02-13 NOTE — Patient Instructions (Signed)
We have scheduled you for a small bowel test on 02/16/16 at 1130am. [facility] at 1115am to [facility]. Nothing by mouth for 3 hours prior to your testing.   We will have Dr. Servando SnareWohl (Gastroenterologist) see you in his office for evaluation as well.  We will have you follow-up with Dr. Excell Seltzerooper after testing is done and you have had your consultation with Dr. Servando SnareWohl. Please see your appointments below.

## 2016-02-16 ENCOUNTER — Ambulatory Visit
Admission: RE | Admit: 2016-02-16 | Discharge: 2016-02-16 | Disposition: A | Discharge status: Home / Self Care | Payer: Medicare Other | Source: Ambulatory Visit | Attending: Surgery | Admitting: Surgery

## 2016-02-16 DIAGNOSIS — K56609 Unspecified intestinal obstruction, unspecified as to partial versus complete obstruction: Secondary | ICD-10-CM

## 2016-02-22 ENCOUNTER — Encounter: Payer: Self-pay | Admitting: Gastroenterology

## 2016-02-22 ENCOUNTER — Ambulatory Visit (INDEPENDENT_AMBULATORY_CARE_PROVIDER_SITE_OTHER): Payer: Medicare Other | Admitting: Gastroenterology

## 2016-02-22 VITALS — BP 122/69 | HR 87 | Temp 97.9°F | Ht 66.0 in | Wt 218.0 lb

## 2016-02-22 DIAGNOSIS — K56609 Unspecified intestinal obstruction, unspecified as to partial versus complete obstruction: Secondary | ICD-10-CM | POA: Diagnosis not present

## 2016-02-22 NOTE — Progress Notes (Signed)
Gastroenterology Consultation  Referring Provider:     Rafael Bihari, MD Primary Care Physician:  Rafael Bihari, MD Primary Gastroenterologist:  Dr. Servando Snare     Reason for Consultation:     Recurrent small bowel obstruction        HPI:   MURIEL WILBER is a 83 y.o. y/o male referred for consultation & management of Recurrent small bowel obstruction by Dr. Danne Harbor, Letta Pate, MD.  This patient comes in after being in the hospital recently for small bowel obstruction. The patient has had 2 episodes of small bowel obstruction without ever having any abdominal surgeries in his past. The patient small bowel obstruction was treated with a NG tube which decompressed his small bowel and follow-up x-ray showed resolution of his small bowel obstruction. The patient had a small bowel follow-through that showed a normal exam. The patient's first bout of obstruction was back in 2016. The patient had a colonoscopy in 2014 with some hyperplastic polyps. The patient is having no problems at this time. He states he is having normal bowel movements without any constipation. There is no abdominal pain nausea vomiting fevers chills or unexplained weight loss  Past Medical History:  Diagnosis Date  . Asthma   . BPH (benign prostatic hyperplasia)   . COPD (chronic obstructive pulmonary disease) ([facility])   . Hypercholesterolemia   . Impingement syndrome of right shoulder   . OSA (obstructive sleep apnea)   . Small bowel obstruction     Past Surgical History:  Procedure Laterality Date  . PROSTATE BIOPSY      Prior to Admission medications   Medication Sig Start Date End Date Taking? Authorizing Provider  ADVAIR DISKUS 500-50 MCG/DOSE AEPB Inhale 2 puffs into the lungs daily. 09/12/14   Historical Provider, MD  albuterol (PROVENTIL) (2.5 MG/3ML) 0.083% nebulizer solution Inhale 3 mLs into the lungs every 6 (six) hours. 02/21/14   Historical Provider, MD  finasteride (PROSCAR) 5 MG tablet Take 1  tablet (5 mg total) by mouth daily. 09/21/14   Lattie Haw, MD  Melatonin 1 MG CAPS Take 1 capsule by mouth at bedtime as needed.    Historical Provider, MD  montelukast (SINGULAIR) 10 MG tablet Take 1 tablet by mouth daily. 09/12/14   Historical Provider, MD  Multiple Vitamins-Minerals (MULTIVITAMIN & MINERAL PO) Take 1 tablet by mouth daily. 11/11/08   Historical Provider, MD  Naproxen Sodium (ALEVE) 220 MG CAPS Take 1 capsule by mouth every 6 (six) hours as needed (headache, mild pain).    Historical Provider, MD  PROAIR HFA 108 (90 BASE) MCG/ACT inhaler Inhale 2 puffs into the lungs 2 (two) times daily. 09/12/14   Historical Provider, MD  ranitidine (ZANTAC) 150 MG tablet Take 150 mg by mouth 2 (two) times daily as needed for heartburn.    Historical Provider, MD  SPIRIVA HANDIHALER 18 MCG inhalation capsule Place 2 puffs into inhaler and inhale daily. 09/13/14   Historical Provider, MD    Family History  Problem Relation Age of Onset  . Asthma Father   . Heart attack Father      Social History  Substance Use Topics  . Smoking status: Current Some Day Smoker    Packs/day: 0.10    Types: Cigarettes  . Smokeless tobacco: Never Used  . Alcohol use No    Allergies as of 02/22/2016 - Review Complete 02/13/2016  Allergen Reaction Noted  . Zileuton Hives 08/03/2013  . Roflumilast Other (See Comments)  08/03/2013    Review of Systems:    All systems reviewed and negative except where noted in HPI.   Physical Exam:  There were no vitals taken for this visit. No LMP for male patient. Psych:  Alert and cooperative. Normal mood and affect. General:   Alert,  Well-developed, well-nourished, pleasant and cooperative in NAD Head:  Normocephalic and atraumatic. Eyes:  Sclera clear, no icterus.   Conjunctiva pink. Ears:  Normal auditory acuity. Nose:  No deformity, discharge, or lesions. Mouth:  No deformity or lesions,oropharynx pink & moist. Neck:  Supple; no masses or  thyromegaly. Lungs:  Respirations even and unlabored.  Clear throughout to auscultation.   No wheezes, crackles, or rhonchi. No acute distress. Heart:  Regular rate and rhythm; no murmurs, clicks, rubs, or gallops. Abdomen:  Normal bowel sounds.  No bruits.  Soft, non-tender and non-distended without masses, hepatosplenomegaly or hernias noted.  No guarding or rebound tenderness.  Negative Carnett sign.   Rectal:  Deferred.  Msk:  Symmetrical without gross deformities.  Good, equal movement & strength bilaterally. Pulses:  Normal pulses noted. Extremities:  No clubbing or edema.  No cyanosis. Neurologic:  Alert and oriented x3;  grossly normal neurologically. Skin:  Intact without significant lesions or rashes.  No jaundice. Lymph Nodes:  No significant cervical adenopathy. Psych:  Alert and cooperative. Normal mood and affect.  Imaging Studies: Dg Abd 1 View  Result Date: 01/26/2016 CLINICAL DATA:  NG tube placement EXAM: ABDOMEN - 1 VIEW COMPARISON:  01/26/2016 FINDINGS: NG tube is in place with the tip in the proximal to mid stomach. IMPRESSION: NG tube tip in the proximal to mid stomach. Electronically Signed   By: Charlett NoseKevin  Dover M.D.   On: 01/26/2016 08:39   Dg Abdomen 1 View  Result Date: 01/26/2016 CLINICAL DATA:  NG tube placement. EXAM: ABDOMEN - 1 VIEW COMPARISON:  CT earlier this day FINDINGS: Tip of the enteric tube projects over the mid chest in the region of the midesophagus. This needs to be advanced greater than 15 cm to be below the diaphragm. There is gaseous gastric distention. Excreted intravenous contrast from recent CT in the renal collecting systems per IMPRESSION: Enteric tube tip in the region of the midesophagus. Repositioning recommended. Electronically Signed   By: Rubye OaksMelanie  Ehinger M.D.   On: 01/26/2016 06:55   Ct Abdomen Pelvis W Contrast  Result Date: 01/26/2016 CLINICAL DATA:  Nausea and abdominal distention. EXAM: CT ABDOMEN AND PELVIS WITH CONTRAST TECHNIQUE:  Multidetector CT imaging of the abdomen and pelvis was performed using the standard protocol following bolus administration of intravenous contrast. CONTRAST:  100mL ISOVUE-300 IOPAMIDOL (ISOVUE-300) INJECTION 61% COMPARISON:  CT abdomen pelvis 08/29/2015 FINDINGS: Lower chest: No pulmonary nodules. No visible pleural or pericardial effusion. Hepatobiliary: Normal hepatic size and contours without focal liver lesion. No perihepatic ascites. No intra- or extrahepatic biliary dilatation. Normal gallbladder. Pancreas: Normal pancreatic contours and enhancement. No peripancreatic fluid collection or pancreatic ductal dilatation. Spleen: Normal. Adrenals/Urinary Tract: Normal adrenal glands. No hydronephrosis or solid renal mass. Stomach/Bowel: There is severe rectosigmoid diverticulosis without evidence of acute inflammation. There is also diverticulosis of the descending colon without acute inflammation. There are multiple mildly dilated loops of small bowel within the mid to lower abdomen. No clear transition point is identified. Normal appendix. Vascular/Lymphatic: There is atherosclerotic calcification of the non aneurysmal abdominal aorta. No abdominal or pelvic adenopathy. Reproductive: Prostate is enlarged, measuring 5.7 cm transverse. Seminal vesicles are normal. Musculoskeletal: No lytic or blastic osseous  lesion. Normal visualized extrathoracic and extraperitoneal soft tissues. Other: No contributory non-categorized findings. IMPRESSION: 1. Multiple mildly dilated loops of small bowel within the central abdomen without a clear transition point. This may indicate early small bowel obstruction or adynamic ileus. 2. No other acute abnormality of the abdomen or pelvis. 3. Extensive descending colonic and rectosigmoid diverticulosis without acute inflammation. 4. Aortic atherosclerosis. Electronically Signed   By: Deatra Robinson M.D.   On: 01/26/2016 04:49   Dg Small Bowel  Result Date: 02/16/2016 CLINICAL DATA:   Small-bowel obstruction. EXAM: SMALL BOWEL SERIES COMPARISON:  01/28/2016.  CT 01/26/2016. TECHNIQUE: Following ingestion of thin barium, serial small bowel images were obtained including spot views of the terminal ileum. FLUOROSCOPY TIME:  Fluoroscopy Time:  0 minutes 48 seconds. Radiation Exposure Index (if provided by the fluoroscopic device): 49.5 mGy Number of Acquired Spot Images: 5 FINDINGS: Small-bowel full thickness and caliber normal. Terminal ileal region normal. No obstructing or focal abnormality identified. Transit time normal. IMPRESSION: Normal exam. Electronically Signed   By: Maisie Fus  Register   On: 02/16/2016 12:25   Dg Chest Portable 1 View  Result Date: 01/26/2016 CLINICAL DATA:  Nausea, abdominal distention and dyspnea EXAM: PORTABLE CHEST 1 VIEW COMPARISON:  08/29/2015 FINDINGS: The cardiac silhouette is borderline enlarged. The aorta is not aneurysmal. Platelike atelectasis in the left mid lung with subsegmental atelectasis in the right lung base. No acute osseous abnormality. No effusion or pneumothorax. No overt pulmonary edema. IMPRESSION: Atelectasis within both lungs without evidence active cardiopulmonary disease. Electronically Signed   By: Tollie Eth M.D.   On: 01/26/2016 04:08   Dg Abd 2 Views  Result Date: 01/28/2016 CLINICAL DATA:  Small bowel obstruction EXAM: ABDOMEN - 2 VIEW COMPARISON:  01/27/2016 FINDINGS: Interval removal of NG tube. Previously seen small bowel dilatation has improved. Gas and is contrast noted in the colon. No free air organomegaly. Improving bibasilar atelectasis. IMPRESSION: Improving small bowel dilatation.  Interval removal of NG tube. Electronically Signed   By: Charlett Nose M.D.   On: 01/28/2016 11:25   Dg Abd 2 Views  Result Date: 01/27/2016 CLINICAL DATA:  Ileus, NG tube EXAM: ABDOMEN - 2 VIEW COMPARISON:  01/26/2016 FINDINGS: NG tube is in the proximal stomach. Mildly dilated mid and lower abdominal small bowel loops have progressed  since prior study and are concerning for small bowel obstruction. Oral contrast material and gas noted within the colon. IMPRESSION: Increasing small bowel dilatation concerning for small bowel obstruction. Electronically Signed   By: Charlett Nose M.D.   On: 01/27/2016 12:12    Assessment and Plan:   ROSSIE BRETADO is a 83 y.o. y/o male who was in the hospital for small bowel obstruction that resolved with NG decompression. The patient is small bowel follow-through without any obstruction it is unlikely that the patient has any colon pathology to explain his symptoms especially since he has had this attack twice with a year in between them. If this was a colon cancer or some sort of neoplasm it would unlikely resolve and recur a year later without any other symptoms. The patient has been told the results of his small bowel follow-through being normal and has been offered a colonoscopy with the information that it is unlikely to show any pathology considering he had a colonoscopy in 2014 that did not show any masses. The patient and his daughter have elected to not proceed with any colonoscopy at this time. I agree with their plan.   Midge Minium,  MD. Marval Regal   Note: This dictation was prepared with Dragon dictation along with smaller phrase technology. Any transcriptional errors that result from this process are unintentional.

## 2016-03-06 ENCOUNTER — Ambulatory Visit: Payer: Self-pay | Admitting: Surgery

## 2016-09-26 DIAGNOSIS — E669 Obesity, unspecified: Secondary | ICD-10-CM | POA: Insufficient documentation

## 2016-10-31 ENCOUNTER — Emergency Department: Payer: Medicare Other

## 2016-10-31 ENCOUNTER — Inpatient Hospital Stay
Admission: EM | Admit: 2016-10-31 | Discharge: 2016-11-02 | DRG: 390 | Disposition: A | Discharge status: Home / Self Care | Payer: Medicare Other | Attending: Surgery | Admitting: Surgery

## 2016-10-31 DIAGNOSIS — G4733 Obstructive sleep apnea (adult) (pediatric): Secondary | ICD-10-CM | POA: Diagnosis present

## 2016-10-31 DIAGNOSIS — J449 Chronic obstructive pulmonary disease, unspecified: Secondary | ICD-10-CM | POA: Diagnosis present

## 2016-10-31 DIAGNOSIS — K56600 Partial intestinal obstruction, unspecified as to cause: Secondary | ICD-10-CM | POA: Diagnosis present

## 2016-10-31 DIAGNOSIS — K5730 Diverticulosis of large intestine without perforation or abscess without bleeding: Secondary | ICD-10-CM | POA: Diagnosis present

## 2016-10-31 DIAGNOSIS — K56609 Unspecified intestinal obstruction, unspecified as to partial versus complete obstruction: Secondary | ICD-10-CM

## 2016-10-31 DIAGNOSIS — E7800 Pure hypercholesterolemia, unspecified: Secondary | ICD-10-CM | POA: Diagnosis present

## 2016-10-31 DIAGNOSIS — R110 Nausea: Secondary | ICD-10-CM

## 2016-10-31 DIAGNOSIS — N400 Enlarged prostate without lower urinary tract symptoms: Secondary | ICD-10-CM | POA: Diagnosis present

## 2016-10-31 DIAGNOSIS — Z888 Allergy status to other drugs, medicaments and biological substances status: Secondary | ICD-10-CM | POA: Diagnosis not present

## 2016-10-31 DIAGNOSIS — F17210 Nicotine dependence, cigarettes, uncomplicated: Secondary | ICD-10-CM | POA: Diagnosis present

## 2016-10-31 LAB — TROPONIN I: Troponin I: <0.03 ng/mL (ref <0.03)

## 2016-10-31 LAB — CBC WITH DIFFERENTIAL/PLATELET
BASOS ABS: 0 10*3/uL (ref 0–0.1)
Basophils Relative: 0 %
EOS ABS: 0.2 10*3/uL (ref 0–0.7)
EOS PCT: 1 %
HCT: 46.5 % (ref 40.0–52.0)
HEMOGLOBIN: 15.9 g/dL (ref 13.0–18.0)
LYMPHS PCT: 4 %
Lymphs Abs: 0.6 10*3/uL — ABNORMAL LOW (ref 1.0–3.6)
MCH: 31.6 pg (ref 26.0–34.0)
MCHC: 34.1 g/dL (ref 32.0–36.0)
MCV: 92.7 fL (ref 80.0–100.0)
Monocytes Absolute: 1.2 10*3/uL — ABNORMAL HIGH (ref 0.2–1.0)
Monocytes Relative: 7 %
NEUTROS PCT: 88 %
Neutro Abs: 14.8 10*3/uL — ABNORMAL HIGH (ref 1.4–6.5)
PLATELETS: 181 10*3/uL (ref 150–440)
RBC: 5.02 MIL/uL (ref 4.40–5.90)
RDW: 12.6 % (ref 11.5–14.5)
WBC: 16.8 10*3/uL — AB (ref 3.8–10.6)

## 2016-10-31 LAB — URINALYSIS, COMPLETE (UACMP) WITH MICROSCOPIC
BILIRUBIN URINE: NEGATIVE
Bacteria, UA: NONE SEEN
GLUCOSE, UA: NEGATIVE mg/dL
HGB URINE DIPSTICK: NEGATIVE
KETONES UR: NEGATIVE mg/dL
LEUKOCYTES UA: NEGATIVE
NITRITE: NEGATIVE
PH: 6 (ref 5.0–8.0)
Protein, ur: NEGATIVE mg/dL
Specific Gravity, Urine: 1.021 (ref 1.005–1.030)
Squamous Epithelial / LPF: NONE SEEN

## 2016-10-31 LAB — COMPREHENSIVE METABOLIC PANEL
ALT: 28 U/L (ref 17–63)
AST: 32 U/L (ref 15–41)
Albumin: 3.9 g/dL (ref 3.5–5.0)
Alkaline Phosphatase: 54 U/L (ref 38–126)
Anion gap: 9 (ref 5–15)
BILIRUBIN TOTAL: 1.2 mg/dL (ref 0.3–1.2)
BUN: 23 mg/dL — AB (ref 6–20)
CO2: 27 mmol/L (ref 22–32)
CREATININE: 0.96 mg/dL (ref 0.61–1.24)
Calcium: 9.2 mg/dL (ref 8.9–10.3)
Chloride: 102 mmol/L (ref 101–111)
Glucose, Bld: 147 mg/dL — ABNORMAL HIGH (ref 65–99)
POTASSIUM: 4 mmol/L (ref 3.5–5.1)
Sodium: 138 mmol/L (ref 135–145)
TOTAL PROTEIN: 7.1 g/dL (ref 6.5–8.1)

## 2016-10-31 MED ORDER — MONTELUKAST SODIUM 10 MG PO TABS
10.0000 mg | ORAL_TABLET | Freq: Every day | ORAL | Status: DC
  Administered 2016-11-01 – 2016-11-02 (×2): 10 mg via ORAL
  Filled 2016-10-31 (×2): qty 1

## 2016-10-31 MED ORDER — IOPAMIDOL (ISOVUE-300) INJECTION 61%
30.0000 mL | Freq: Once | INTRAVENOUS | Status: AC | PRN
  Administered 2016-10-31: 30 mL via ORAL

## 2016-10-31 MED ORDER — MORPHINE SULFATE (PF) 2 MG/ML IV SOLN
2.0000 mg | INTRAVENOUS | Status: DC | PRN
  Administered 2016-10-31: 2 mg via INTRAVENOUS
  Filled 2016-10-31: qty 1

## 2016-10-31 MED ORDER — ONDANSETRON HCL 4 MG/2ML IJ SOLN
4.0000 mg | Freq: Once | INTRAMUSCULAR | Status: AC
  Administered 2016-10-31: 4 mg via INTRAVENOUS
  Filled 2016-10-31: qty 2

## 2016-10-31 MED ORDER — TIOTROPIUM BROMIDE MONOHYDRATE 18 MCG IN CAPS
18.0000 ug | ORAL_CAPSULE | Freq: Every day | RESPIRATORY_TRACT | Status: DC
  Administered 2016-11-01 – 2016-11-02 (×2): 18 ug via RESPIRATORY_TRACT
  Filled 2016-10-31: qty 5

## 2016-10-31 MED ORDER — SODIUM CHLORIDE 0.9 % IV BOLUS (SEPSIS)
1000.0000 mL | Freq: Once | INTRAVENOUS | Status: AC
  Administered 2016-10-31: 1000 mL via INTRAVENOUS

## 2016-10-31 MED ORDER — HEPARIN SODIUM (PORCINE) 5000 UNIT/ML IJ SOLN
5000.0000 [IU] | Freq: Three times a day (TID) | INTRAMUSCULAR | Status: DC
  Administered 2016-10-31 – 2016-11-01 (×4): 5000 [IU] via SUBCUTANEOUS
  Filled 2016-10-31 (×5): qty 1

## 2016-10-31 MED ORDER — FINASTERIDE 5 MG PO TABS
5.0000 mg | ORAL_TABLET | Freq: Every day | ORAL | Status: DC
  Administered 2016-11-01 – 2016-11-02 (×2): 5 mg via ORAL
  Filled 2016-10-31 (×2): qty 1

## 2016-10-31 MED ORDER — ONDANSETRON HCL 4 MG/2ML IJ SOLN
4.0000 mg | Freq: Four times a day (QID) | INTRAMUSCULAR | Status: DC | PRN
  Administered 2016-10-31: 4 mg via INTRAVENOUS
  Filled 2016-10-31: qty 2

## 2016-10-31 MED ORDER — PIPERACILLIN-TAZOBACTAM 3.375 G IVPB 30 MIN
3.3750 g | Freq: Three times a day (TID) | INTRAVENOUS | Status: DC
  Administered 2016-10-31 – 2016-11-01 (×2): 3.375 g via INTRAVENOUS
  Filled 2016-10-31 (×6): qty 50

## 2016-10-31 MED ORDER — ONDANSETRON HCL 4 MG PO TABS: 4.0000 mg | ORAL_TABLET | Freq: Four times a day (QID) | ORAL | Status: DC | PRN

## 2016-10-31 MED ORDER — MOMETASONE FURO-FORMOTEROL FUM 200-5 MCG/ACT IN AERO
2.0000 | INHALATION_SPRAY | Freq: Two times a day (BID) | RESPIRATORY_TRACT | Status: DC
  Administered 2016-10-31 – 2016-11-02 (×4): 2 via RESPIRATORY_TRACT
  Filled 2016-10-31: qty 8.8

## 2016-10-31 MED ORDER — INFLUENZA VAC SPLIT HIGH-DOSE 0.5 ML IM SUSY
0.5000 mL | PREFILLED_SYRINGE | INTRAMUSCULAR | Status: DC
  Filled 2016-10-31: qty 0.5

## 2016-10-31 MED ORDER — IOPAMIDOL (ISOVUE-300) INJECTION 61%
100.0000 mL | Freq: Once | INTRAVENOUS | Status: AC | PRN
  Administered 2016-10-31: 100 mL via INTRAVENOUS

## 2016-10-31 MED ORDER — KCL IN DEXTROSE-NACL 20-5-0.9 MEQ/L-%-% IV SOLN
INTRAVENOUS | Status: DC
  Administered 2016-10-31 – 2016-11-02 (×4): via INTRAVENOUS
  Filled 2016-10-31 (×7): qty 1000

## 2016-10-31 MED ORDER — ALBUTEROL SULFATE (2.5 MG/3ML) 0.083% IN NEBU
3.0000 mL | INHALATION_SOLUTION | Freq: Four times a day (QID) | RESPIRATORY_TRACT | Status: DC
  Administered 2016-10-31 – 2016-11-02 (×6): 3 mL via RESPIRATORY_TRACT
  Filled 2016-10-31 (×7): qty 3

## 2016-10-31 MED ORDER — ALBUTEROL SULFATE HFA 108 (90 BASE) MCG/ACT IN AERS
2.0000 | INHALATION_SPRAY | Freq: Two times a day (BID) | RESPIRATORY_TRACT | Status: DC
Start: 2016-10-31 — End: 2016-10-31

## 2016-10-31 NOTE — ED Provider Notes (Addendum)
This patient was signed out to me by Dr. Governor Rooks. 83 year old gentleman with a history of prior bowel obstruction presenting with nausea, burping, and abdominal distention On my examination, the patient continues to feel nauseated and have some burping. He has a mildly distended abdomen without any tenderness to palpation. No peritoneal signs. He does have an elevated white blood cell count but is afebrile. The patient states he had a bowel movement this morning, but he still could have a partial small bowel dissection so we'll proceed with CT imaging and continued symptomatic treatment at this time.  ----------------------------------------- 6:17 PM on 10/31/2016 -----------------------------------------  The patient CT scan does show a small bowel obstruction, and I have spoke with Dr. Excell Seltzer who will admit the patient. An NG tube will be placed. Additional medications will be given for his nausea is a patient continues to have significant nausea at this time. He understands the results of the studies and is in agreement with admission.    Rockne Menghini, MD 10/31/16 1622    Rockne Menghini, MD 10/31/16 [phone]

## 2016-10-31 NOTE — H&P (Signed)
Kyle Wells is an 83 y.o. male.    Chief Complaint: Nausea  HPI: This patient with multiple episodes of nausea and vomiting. He states he is not vomited this time and has passed some gas and had a bowel movement today but has abdominal pain and nausea is very similar not identical to his prior episodes. Patient is well known to me and was last seen in January after his admission to the hospital. He had a full workup including a CT scan a small bowel series which was normal and was referred to Dr. Allen Norris for GI but did not complete a colonoscopy planned.  He's been seen prior for small bowel obstruction in spite of not having ever had surgery on his abdomen.  Past Medical History:  Diagnosis Date  . Asthma   . BPH (benign prostatic hyperplasia)   . COPD (chronic obstructive pulmonary disease) ([facility])   . Hypercholesterolemia   . Impingement syndrome of right shoulder   . OSA (obstructive sleep apnea)   . Small bowel obstruction [facility])     Past Surgical History:  Procedure Laterality Date  . PROSTATE BIOPSY      Family History  Problem Relation Age of Onset  . Asthma Father   . Heart attack Father    Social History:  reports that he has been smoking Cigarettes.  He has been smoking about 0.10 packs per day. He has never used smokeless tobacco. He reports that he does not drink alcohol or use drugs.  Allergies:  Allergies  Allergen Reactions  . Zileuton Hives    [Onset: 10/27/2009]  . Roflumilast Other (See Comments)    insomnia   [Onset: 11/29/2010]     (Not in a hospital admission)   Review of Systems  Constitutional: Negative.   HENT: Negative.   Eyes: Negative.   Respiratory: Negative.   Cardiovascular: Negative.   Gastrointestinal: Positive for abdominal pain and nausea. Negative for blood in stool, constipation, diarrhea, heartburn, melena and vomiting.  Genitourinary: Negative.   Musculoskeletal: Negative.   Skin: Negative.   Neurological: Negative.    Endo/Heme/Allergies: Negative.   Psychiatric/Behavioral: Negative.      Physical Exam:  BP 135/81   Pulse 60   Temp 98.1 F (36.7 C) (Oral)   Resp (!) 21   Ht '5\' 6"'  (1.676 m)   Wt 213 lb (96.6 kg)   SpO2 95%   BMI 34.38 kg/m   Physical Exam  Constitutional: He is oriented to person, place, and time and well-developed, well-nourished, and in no distress. No distress.  HENT:  Head: Normocephalic and atraumatic.  Eyes: Pupils are equal, round, and reactive to light. Right eye exhibits no discharge. Left eye exhibits no discharge. No scleral icterus.  Neck: Normal range of motion. No JVD present.  Cardiovascular: Normal rate, regular rhythm and normal heart sounds.   Pulmonary/Chest: Effort normal and breath sounds normal. No respiratory distress. He has no wheezes.  Abdominal: Soft. He exhibits distension. There is no tenderness. There is no rebound and no guarding.  Musculoskeletal: Normal range of motion. He exhibits edema. He exhibits no tenderness.  Lymphadenopathy:    He has no cervical adenopathy.  Neurological: He is alert and oriented to person, place, and time.  Skin: Skin is warm and dry. No rash noted. He is not diaphoretic. No erythema.  Psychiatric: Mood and affect normal.  Vitals reviewed.       Results for orders placed or performed during the hospital encounter of 10/31/16 (from the  past 48 hour(s))  Comprehensive metabolic panel     Status: Abnormal   Collection Time: 10/31/16  2:10 PM  Result Value Ref Range   Sodium 138 135 - 145 mmol/L   Potassium 4.0 3.5 - 5.1 mmol/L   Chloride 102 101 - 111 mmol/L   CO2 27 22 - 32 mmol/L   Glucose, Bld 147 (H) 65 - 99 mg/dL   BUN 23 (H) 6 - 20 mg/dL   Creatinine, Ser 0.96 0.61 - 1.24 mg/dL   Calcium 9.2 8.9 - 10.3 mg/dL   Total Protein 7.1 6.5 - 8.1 g/dL   Albumin 3.9 3.5 - 5.0 g/dL   AST 32 15 - 41 U/L   ALT 28 17 - 63 U/L   Alkaline Phosphatase 54 38 - 126 U/L   Total Bilirubin 1.2 0.3 - 1.2 mg/dL   GFR  calc non Af Amer >60 >60 mL/min   GFR calc Af Amer >60 >60 mL/min    Comment: (NOTE) The eGFR has been calculated using the CKD EPI equation. This calculation has not been validated in all clinical situations. eGFR's persistently <60 mL/min signify possible Chronic Kidney Disease.    Anion gap 9 5 - 15  CBC with Differential     Status: Abnormal   Collection Time: 10/31/16  2:10 PM  Result Value Ref Range   WBC 16.8 (H) 3.8 - 10.6 K/uL   RBC 5.02 4.40 - 5.90 MIL/uL   Hemoglobin 15.9 13.0 - 18.0 g/dL   HCT 46.5 40.0 - 52.0 %   MCV 92.7 80.0 - 100.0 fL   MCH 31.6 26.0 - 34.0 pg   MCHC 34.1 32.0 - 36.0 g/dL   RDW 12.6 11.5 - 14.5 %   Platelets 181 150 - 440 K/uL   Neutrophils Relative % 88 %   Neutro Abs 14.8 (H) 1.4 - 6.5 K/uL   Lymphocytes Relative 4 %   Lymphs Abs 0.6 (L) 1.0 - 3.6 K/uL   Monocytes Relative 7 %   Monocytes Absolute 1.2 (H) 0.2 - 1.0 K/uL   Eosinophils Relative 1 %   Eosinophils Absolute 0.2 0 - 0.7 K/uL   Basophils Relative 0 %   Basophils Absolute 0.0 0 - 0.1 K/uL  Urinalysis, Complete w Microscopic     Status: Abnormal   Collection Time: 10/31/16  2:10 PM  Result Value Ref Range   Color, Urine YELLOW (A) YELLOW   APPearance CLEAR (A) CLEAR   Specific Gravity, Urine 1.021 1.005 - 1.030   pH 6.0 5.0 - 8.0   Glucose, UA NEGATIVE NEGATIVE mg/dL   Hgb urine dipstick NEGATIVE NEGATIVE   Bilirubin Urine NEGATIVE NEGATIVE   Ketones, ur NEGATIVE NEGATIVE mg/dL   Protein, ur NEGATIVE NEGATIVE mg/dL   Nitrite NEGATIVE NEGATIVE   Leukocytes, UA NEGATIVE NEGATIVE   RBC / HPF 0-5 0 - 5 RBC/hpf   WBC, UA 0-5 0 - 5 WBC/hpf   Bacteria, UA NONE SEEN NONE SEEN   Squamous Epithelial / LPF NONE SEEN NONE SEEN   Mucus PRESENT   Troponin I     Status: None   Collection Time: 10/31/16  2:10 PM  Result Value Ref Range   Troponin I <0.03 <0.03 ng/mL   Dg Chest 2 View  Result Date: 10/31/2016 CLINICAL DATA:  Nauseated, sob, dry heaving today. Asthma. Copd. Smoker.  EXAM: CHEST  2 VIEW COMPARISON:  01/26/2016 FINDINGS: Cardiac silhouette is normal in size. No mediastinal or hilar masses. No evidence of adenopathy. Mild linear  atelectasis at the lung bases, improved when compared to the prior exam. Lungs otherwise clear. No pleural effusion or pneumothorax. Skeletal structures are demineralized but intact. IMPRESSION: 1. No acute cardiopulmonary disease. Electronically Signed   By: Lajean Manes M.D.   On: 10/31/2016 15:33   Ct Abdomen Pelvis W Contrast  Result Date: 10/31/2016 CLINICAL DATA:  Abdominal distention. EXAM: CT ABDOMEN AND PELVIS WITH CONTRAST TECHNIQUE: Multidetector CT imaging of the abdomen and pelvis was performed using the standard protocol following bolus administration of intravenous contrast. CONTRAST:  158m ISOVUE-300 IOPAMIDOL (ISOVUE-300) INJECTION 61% COMPARISON:  01/26/2016 FINDINGS: Lower chest: No acute abnormality. Hepatobiliary: No focal liver abnormality is seen. Tiny stones within the dependent portion of the gallbladder is suspected, image 25 of series 2. No gallbladder wall thickening. No biliary dilatation. Pancreas: Unremarkable. No pancreatic ductal dilatation or surrounding inflammatory changes. Spleen: Normal in size without focal abnormality. Adrenals/Urinary Tract: The adrenal glands appear within normal limits. No kidney mass or hydronephrosis identified. Urinary bladder appears normal. Stomach/Bowel: Small hiatal hernia. Moderate distension of the stomach. Increase caliber of the mid small bowel loops measuring up to 3.5 cm. Transition to decreased caliber distal small bowel noted in the right lower quadrant of the abdomen, image 52 of series 5. Numerous colonic diverticula noted without acute inflammation. Normal caliber of the large bowel. Vascular/Lymphatic: Aortic atherosclerosis. No aneurysm. No upper abdominal adenopathy identified. No pelvic or inguinal adenopathy. Reproductive: The prostate gland measures 6 by 5.6 by 5.2 cm  (volume = 90 cm^3). Other: No abdominal wall hernia or abnormality. No abdominopelvic ascites. Musculoskeletal: Degenerative disc disease is identified throughout the lumbar spine. Chronic appearing compression deformity involved L1 vertebra is identified. Unchanged from 01/26/2016. IMPRESSION: 1. Exam positive for small bowel obstruction. Transition point is identified within the right lower quadrant of the abdomen. No free air, free fluid or fluid collections identified. 2. Colonic diverticulosis without acute inflammation. 3.  Aortic Atherosclerosis (ICD10-I70.0). 4. Prostate gland enlargement 5. Small hiatal hernia. Electronically Signed   By: TKerby MoorsM.D.   On: 10/31/2016 17:44     Assessment/Plan  CT scan is personally reviewed suggesting a bowel obstruction however in spite of his small bowel loops being dilated he has gas throughout his transverse colon and extensive diverticulosis  This a patient well known to me from prior admissions he's been admitted to the hospital multiple times where a nasogastric tube is placed any spontaneously resolves his small bowel obstruction very quickly. I plan admission to the hospital again and hydration and placing an nasogastric tube. I will reexamine him and repeat films in the morning. I discussed with he and his son the potential for surgery should this worsen at any time or not resolve.  RFlorene Glen MD, FACS

## 2016-10-31 NOTE — ED Notes (Signed)
Patient transported to CT 

## 2016-10-31 NOTE — ED Provider Notes (Signed)
[facility] Emergency Department Provider Note ____________________________________________   I have reviewed the triage vital signs and the triage nursing note.  HISTORY  Chief Complaint Nausea   Historian Patient And son in the room  HPI Kyle Wells is a 83 y.o. male with history of COPD, history of small bowel obstruction, overall feels relatively healthy reports that he woke up this morning feeling nauseated. He's had numerous dry heaves without active actual vomiting. Few episodes of diarrhea. No abdominal pain. No fever. States that he was outside in the heat, but he was actually an air-conditioned tractor although he does state that he felt this way similarly in the past when he was dehydrated.  Denies dysuria or frequency of urination.  States he has baseline shortness of breath, no new wheezing or coughing or fever.    Past Medical History:  Diagnosis Date  . Asthma   . BPH (benign prostatic hyperplasia)   . COPD (chronic obstructive pulmonary disease) (HCC)   . Hypercholesterolemia   . Impingement syndrome of right shoulder   . OSA (obstructive sleep apnea)   . Small bowel obstruction [facility])     Patient Active Problem List   Diagnosis Date Noted  . Obstructive sleep apnea on CPAP 11/02/2015  . COPD, mild (HCC)   . SBO (small bowel obstruction) (HCC)   . Ileus (HCC) 09/19/2014  . Impingement syndrome of shoulder, right 05/13/2014  . Benign localized hyperplasia of prostate with urinary obstruction and other lower urinary tract symptoms (LUTS)(600.21) 03/30/2014  . Hypercholesteremia 03/30/2014  . Sleep apnea 03/03/2012  . Benign localized prostatic hyperplasia with lower urinary tract symptoms (LUTS) 10/16/2011  . Elevated prostate specific antigen (PSA) 10/16/2011  . Enlarged prostate with lower urinary tract symptoms (LUTS) 10/16/2011  . Incomplete emptying of bladder 10/16/2011  . Increased frequency of urination 10/16/2011  .  Asthma with COPD (HCC) 05/31/2011  . Chronic obstructive asthma (HCC) 05/31/2011  . House dust mite allergy 10/20/2010  . Other allergy, other than to medicinal agents 10/20/2010    Past Surgical History:  Procedure Laterality Date  . PROSTATE BIOPSY      Prior to Admission medications   Medication Sig Start Date End Date Taking? Authorizing Provider  ADVAIR DISKUS 500-50 MCG/DOSE AEPB Inhale 2 puffs into the lungs daily. 09/12/14   [provider]  albuterol (PROVENTIL) (2.5 MG/3ML) 0.083% nebulizer solution Inhale 3 mLs into the lungs every 6 (six) hours. 02/21/14   [provider]  finasteride (PROSCAR) 5 MG tablet Take 1 tablet (5 mg total) by mouth daily. 09/21/14   Lattie Haw, MD  Melatonin 1 MG CAPS Take 1 capsule by mouth at bedtime as needed.    [provider]  montelukast (SINGULAIR) 10 MG tablet Take 1 tablet by mouth daily. 09/12/14   [provider]  Multiple Vitamins-Minerals (MULTIVITAMIN & MINERAL PO) Take 1 tablet by mouth daily. 11/11/08   [provider]  Naproxen Sodium (ALEVE) 220 MG CAPS Take 1 capsule by mouth every 6 (six) hours as needed (headache, mild pain).    [provider]  PROAIR HFA 108 (90 BASE) MCG/ACT inhaler Inhale 2 puffs into the lungs 2 (two) times daily. 09/12/14   [provider]  ranitidine (ZANTAC) 150 MG tablet Take 150 mg by mouth 2 (two) times daily as needed for heartburn.    [provider]  SPIRIVA HANDIHALER 18 MCG inhalation capsule Place 2 puffs into inhaler and inhale daily. 09/13/14   [provider]    Allergies  Allergen Reactions  . Zileuton Hives    [Onset: 10/27/2009]  . Roflumilast Other (See Comments)    insomnia   [Onset: 11/29/2010]    Family History  Problem Relation Age of Onset  . Asthma Father   . Heart attack Father     Social History Social History  Substance Use Topics  . Smoking status: Current Some Day Smoker     Packs/day: 0.10    Types: Cigarettes  . Smokeless tobacco: Never Used  . Alcohol use No    Review of Systems  Constitutional: Negative for fever. Eyes: Negative for visual changes. ENT: Negative for sore throat. Cardiovascular: Negative for chest pain. Respiratory: Negative for shortness of breath. Gastrointestinal: Negative for abdominal pain. Genitourinary: Negative for dysuria. Musculoskeletal: Negative for back pain. Skin: Negative for rash. Neurological: Negative for headache.  ____________________________________________   PHYSICAL EXAM:  VITAL SIGNS: ED Triage Vitals  Enc Vitals Group     BP 10/31/16 1400 120/71     Pulse Rate 10/31/16 1400 88     Resp 10/31/16 1400 (!) 21     Temp 10/31/16 1400 98.1 F (36.7 C)     Temp Source 10/31/16 1400 Oral     SpO2 10/31/16 1357 97 %     Weight 10/31/16 1400 213 lb (96.6 kg)     Height 10/31/16 1400  (1.676 m)     Head Circumference --      Peak Flow --      Pain Score --      Pain Loc --      Pain Edu? --      Excl. in GC? --      Constitutional: Alert and oriented. Well appearing and in no distress.  occasional dry heave. HEENT   Head: Normocephalic and atraumatic.      Eyes: Conjunctivae are normal. Pupils equal and round.       Ears:         Nose: No congestion/rhinnorhea.   Mouth/Throat: Mucous membranes are moist.   Neck: No stridor. Cardiovascular/Chest: Normal rate, regular rhythm.  No murmurs, rubs, or gallops. Respiratory: Normal respiratory effort without tachypnea nor retractions. Breath sounds are clear and equal bilaterally. No wheezes/rales/rhonchi. Gastrointestinal: Soft. No distention, no guarding, no rebound. Nontender.    Genitourinary/rectal:Deferred Musculoskeletal: Nontender with normal range of motion in all extremities. No joint effusions.  No lower extremity tenderness.  No edema. Neurologic:  Normal speech and language. No gross or focal neurologic deficits are  appreciated. Skin:  Skin is warm, dry and intact. No rash noted. Psychiatric: Mood and affect are normal. Speech and behavior are normal. Patient exhibits appropriate insight and judgment.   ____________________________________________  LABS (pertinent positives/negatives) I, Governor Rooks, MD the attending physician have reviewed the labs noted below.  Labs Reviewed  CBC WITH DIFFERENTIAL/PLATELET - Abnormal; Notable for the following:       Result Value   WBC 16.8 (*)    Neutro Abs 14.8 (*)    Lymphs Abs 0.6 (*)    Monocytes Absolute 1.2 (*)    All other components within normal limits  COMPREHENSIVE METABOLIC PANEL  URINALYSIS, COMPLETE (UACMP) WITH MICROSCOPIC  TROPONIN I    ____________________________________________    EKG I, Governor Rooks, MD, the attending physician have personally viewed and interpreted all ECGs.  86 bpm. Normal sinus rhythm. Narrow QRS normal axis. Nonspecific ST and T-wave ____________________________________________  RADIOLOGY All Xrays were viewed by me.  Imaging  interpreted by Radiologist, and I, Governor Rooks, MD the attending physician have reviewed the radiologist interpretation noted below.  Chest 2 view:  Pending __________________________________________  PROCEDURES  Procedure(s) performed: None  Critical Care performed: None  ____________________________________________  No current facility-administered medications on file prior to encounter.    Current Outpatient Prescriptions on File Prior to Encounter  Medication Sig Dispense Refill  . ADVAIR DISKUS 500-50 MCG/DOSE AEPB Inhale 2 puffs into the lungs daily.    Marland Kitchen albuterol (PROVENTIL) (2.5 MG/3ML) 0.083% nebulizer solution Inhale 3 mLs into the lungs every 6 (six) hours.    . finasteride (PROSCAR) 5 MG tablet Take 1 tablet (5 mg total) by mouth daily. 30 tablet 1  . Melatonin 1 MG CAPS Take 1 capsule by mouth at bedtime as needed.    . montelukast (SINGULAIR) 10 MG tablet  Take 1 tablet by mouth daily.    . Multiple Vitamins-Minerals (MULTIVITAMIN & MINERAL PO) Take 1 tablet by mouth daily.    . Naproxen Sodium (ALEVE) 220 MG CAPS Take 1 capsule by mouth every 6 (six) hours as needed (headache, mild pain).    Marland Kitchen PROAIR HFA 108 (90 BASE) MCG/ACT inhaler Inhale 2 puffs into the lungs 2 (two) times daily.    . ranitidine (ZANTAC) 150 MG tablet Take 150 mg by mouth 2 (two) times daily as needed for heartburn.    Marland Kitchen SPIRIVA HANDIHALER 18 MCG inhalation capsule Place 2 puffs into inhaler and inhale daily.      ____________________________________________  ED COURSE / ASSESSMENT AND PLAN  Pertinent labs & imaging results that were available during my care of the patient were reviewed by me and considered in my medical decision making (see chart for details).   Mr. Yapp is overall well-appearing but he still having some active emesis despite stating that he feels somewhat better after Zofran given by EMS.  Stable vital signs. This does not sound cardiac in etiology, however will do full evaluation. Started off with IV fluid bolus and getting a little bit more time for his nausea meds.  Patient care transferred to Dr. Sharma Covert at shift change 3pm. Disposition per labs, urine, cxr, and reevaluation.   DIFFERENTIAL DIAGNOSIS: including but not limited to hyperglycemia, DKA, gastritis, gastritis, heart attack, etc.  CONSULTATIONS:   None   Patient / Family / Caregiver informed of clinical course, medical decision-making process, and agree with plan.   ___________________________________________   FINAL CLINICAL IMPRESSION(S) / ED DIAGNOSES   Final diagnoses:  Nausea              Note: This dictation was prepared with Dragon dictation. Any transcriptional errors that result from this process are unintentional    Governor Rooks, MD 10/31/16 [phone]

## 2016-10-31 NOTE — ED Notes (Signed)
Myra NT called floor in front of this RN to let them know patient on the way.

## 2016-10-31 NOTE — ED Triage Notes (Signed)
Patient comes in from home via EMS with complaints of intermittent shortness of breath, nausea with no vomiting, and diarrhea two times since 7am.  Patient alert and oriented.

## 2016-11-01 ENCOUNTER — Inpatient Hospital Stay: Payer: Medicare Other

## 2016-11-01 LAB — BASIC METABOLIC PANEL
Anion gap: 6 (ref 5–15)
BUN: 24 mg/dL — AB (ref 6–20)
CHLORIDE: 106 mmol/L (ref 101–111)
CO2: 27 mmol/L (ref 22–32)
CREATININE: 0.97 mg/dL (ref 0.61–1.24)
Calcium: 8.7 mg/dL — ABNORMAL LOW (ref 8.9–10.3)
GFR calc Af Amer: >60 mL/min (ref >60)
GFR calc non Af Amer: >60 mL/min (ref >60)
GLUCOSE: 150 mg/dL — AB (ref 65–99)
POTASSIUM: 3.7 mmol/L (ref 3.5–5.1)
Sodium: 139 mmol/L (ref 135–145)

## 2016-11-01 LAB — CBC
HEMATOCRIT: 42 % (ref 40.0–52.0)
Hemoglobin: 14.7 g/dL (ref 13.0–18.0)
MCH: 32.1 pg (ref 26.0–34.0)
MCHC: 35 g/dL (ref 32.0–36.0)
MCV: 91.8 fL (ref 80.0–100.0)
PLATELETS: 180 10*3/uL (ref 150–440)
RBC: 4.57 MIL/uL (ref 4.40–5.90)
RDW: 12.7 % (ref 11.5–14.5)
WBC: 10.9 10*3/uL — ABNORMAL HIGH (ref 3.8–10.6)

## 2016-11-01 MED ORDER — PIPERACILLIN-TAZOBACTAM 3.375 G IVPB
3.3750 g | Freq: Three times a day (TID) | INTRAVENOUS | Status: DC
  Administered 2016-11-01 – 2016-11-02 (×3): 3.375 g via INTRAVENOUS
  Filled 2016-11-01 (×3): qty 50

## 2016-11-01 NOTE — Progress Notes (Signed)
NG tube removed per order. Will continue to monitor pt closely. Pt is aware to let RN know if he feels any nausea and if he vomits.    Kyle Wells [employer]

## 2016-11-01 NOTE — Progress Notes (Signed)
Visit patient after personally reviewing his abdominal films. Contrast is in the colon. Gas is in the colon.  Patient feels better he has no pain no nausea or vomiting. He has passed gas.  Abdomen remains distended but nontender  We'll discontinued nasogastric tube and start clears. With the multiple recurrences in this patient at some point exploration may be necessary. This can be done on elective basis. I had sent him to a GI physician. Patient asked about going back to the GI doctor prior to scheduling any sort of surgery. We will see how he does today and possibly discharge later today or tomorrow.

## 2016-11-02 NOTE — Discharge Instructions (Signed)
Soft diet Resume all home medications Follow-up with Dr. Excell Seltzer in 2 weeks

## 2016-11-02 NOTE — Progress Notes (Signed)
Pt. VS WDL all belongings given to him and we wheeled him down to front door where his wife was waiting.

## 2016-11-02 NOTE — Discharge Summary (Signed)
Physician Discharge Summary  Patient ID: Kyle Wells MRN: [id number] DOB/AGE: 81-Aug-1935 81 y.o.  Admit date: 10/31/2016 Discharge date: 11/02/2016   Discharge Diagnoses:  Active Problems:   SBO (small bowel obstruction) [facility])   Procedures:None  Hospital Course: This a patient with a small bowel obstruction. This been a recurrent problem for the patient. This one has spontaneously resolved as had previous small bowel obstructions. Previously had sent him for a small bowel series as well as GI consultation WHICH were negative. The etiology of his small bowel obstruction is not clear. I have discussed with him the potential for surgical intervention at some point but I believe that to be less than fruitful as no findings have been noted on small bowel series. We'll see in the office possibly repeat the small bowel series at some point. He is to resume all home medications and activities no new medications.  Consults: None  Disposition: 01-Home or Self Care   Allergies as of 11/02/2016      Reactions   Zileuton Hives   [Onset: 10/27/2009]   Roflumilast Other (See Comments)   insomnia   [Onset: 11/29/2010]      Medication List    TAKE these medications   ADVAIR DISKUS 500-50 MCG/DOSE Aepb Generic drug:  Fluticasone-Salmeterol Inhale 2 puffs into the lungs daily.   ALEVE 220 MG Caps Generic drug:  Naproxen Sodium Take 1 capsule by mouth every 6 (six) hours as needed (headache, mild pain).   finasteride 5 MG tablet Commonly known as:  PROSCAR Take 1 tablet (5 mg total) by mouth daily.   montelukast 10 MG tablet Commonly known as:  SINGULAIR Take 1 tablet by mouth daily.   MULTIVITAMIN & MINERAL PO Take 1 tablet by mouth daily.   albuterol (2.5 MG/3ML) 0.083% nebulizer solution Commonly known as:  PROVENTIL Inhale 3 mLs into the lungs every 6 (six) hours.   PROAIR HFA 108 (90 Base) MCG/ACT inhaler Generic drug:  albuterol Inhale 2 puffs into the lungs 2 (two) times  daily.   ranitidine 150 MG tablet Commonly known as:  ZANTAC Take 150 mg by mouth daily.   SPIRIVA HANDIHALER 18 MCG inhalation capsule Generic drug:  tiotropium Place 2 puffs into inhaler and inhale daily.      Follow-up Information    Lattie Haw, MD Follow up in 2 week(s).   Specialty:  Surgery Contact information: [address] [place] [address] [phone]           Lattie Haw, MD, FACS

## 2016-11-02 NOTE — Progress Notes (Signed)
CC: Partial small bowel obstruction Subjective: This patient with a partial small bowel obstruction which is completely resolved. He has no pain no nausea vomiting fevers or chills passing gas and having bowel movements.  Objective: Vital signs in last 24 hours: Temp:  [97.6 F (36.4 C)-98.4 F (36.9 C)] 97.6 F (36.4 C) (10/06 0454) Pulse Rate:  [50-64] 50 (10/06 0456) Resp:  [16-18] 18 (10/06 0456) BP: (95-104)/(44-61) 100/57 (10/06 0456) SpO2:  [94 %-99 %] 96 % (10/06 0734) Last BM Date: 10/31/16 (per pt)  Intake/Output from previous day: 10/05 0701 - 10/06 0700 In: 2720.7 [P.O.:750; I.V.:1861.7; IV Piggyback:109] Out: 1425 [Urine:1025; Emesis/NG output:400] Intake/Output this shift: Total I/O In: 261.7 [I.V.:251.7; IV Piggyback:10] Out: -   Physical exam:  Abdomen is soft nondistended nontympanitic and nontender. Vital signs are stable and reviewed. Patient is awake alert and oriented and appears quite comfortable. Calves are nontender  Lab Results: CBC   Recent Labs  10/31/16 1410 11/01/16 0439  WBC 16.8* 10.9*  HGB 15.9 14.7  HCT 46.5 42.0  PLT 181 180   BMET  Recent Labs  10/31/16 1410 11/01/16 0439  NA 138 139  K 4.0 3.7  CL 102 106  CO2 27 27  GLUCOSE 147* 150*  BUN 23* 24*  CREATININE 0.96 0.97  CALCIUM 9.2 8.7*   PT/INR No results for input(s): LABPROT, INR in the last 72 hours. ABG No results for input(s): PHART, HCO3 in the last 72 hours.  Invalid input(s): PCO2, PO2  Studies/Results: Dg Chest 2 View  Result Date: 10/31/2016 CLINICAL DATA:  Nauseated, sob, dry heaving today. Asthma. Copd. Smoker. EXAM: CHEST  2 VIEW COMPARISON:  01/26/2016 FINDINGS: Cardiac silhouette is normal in size. No mediastinal or hilar masses. No evidence of adenopathy. Mild linear atelectasis at the lung bases, improved when compared to the prior exam. Lungs otherwise clear. No pleural effusion or pneumothorax. Skeletal structures are demineralized but intact.  IMPRESSION: 1. No acute cardiopulmonary disease. Electronically Signed   By: Amie Portland M.D.   On: 10/31/2016 15:33   Dg Abdomen 1 View  Result Date: 10/31/2016 CLINICAL DATA:  NG tube placement EXAM: ABDOMEN - 1 VIEW COMPARISON:  CT 10/31/2016 FINDINGS: Esophageal tube tip and side port overlie the proximal stomach. Visible upper gas pattern unremarkable. IMPRESSION: Esophageal tube tip overlies the proximal stomach Electronically Signed   By: Jasmine Pang M.D.   On: 10/31/2016 19:25   Ct Abdomen Pelvis W Contrast  Result Date: 10/31/2016 CLINICAL DATA:  Abdominal distention. EXAM: CT ABDOMEN AND PELVIS WITH CONTRAST TECHNIQUE: Multidetector CT imaging of the abdomen and pelvis was performed using the standard protocol following bolus administration of intravenous contrast. CONTRAST:  ISOVUE-300 IOPAMIDOL (ISOVUE-300) INJECTION 61% COMPARISON:  01/26/2016 FINDINGS: Lower chest: No acute abnormality. Hepatobiliary: No focal liver abnormality is seen. Tiny stones within the dependent portion of the gallbladder is suspected, image 25 of series 2. No gallbladder wall thickening. No biliary dilatation. Pancreas: Unremarkable. No pancreatic ductal dilatation or surrounding inflammatory changes. Spleen: Normal in size without focal abnormality. Adrenals/Urinary Tract: The adrenal glands appear within normal limits. No kidney mass or hydronephrosis identified. Urinary bladder appears normal. Stomach/Bowel: Small hiatal hernia. Moderate distension of the stomach. Increase caliber of the mid small bowel loops measuring up to 3.5 cm. Transition to decreased caliber distal small bowel noted in the right lower quadrant of the abdomen, image 52 of series 5. Numerous colonic diverticula noted without acute inflammation. Normal caliber of the large bowel. Vascular/Lymphatic: Aortic atherosclerosis.  No aneurysm. No upper abdominal adenopathy identified. No pelvic or inguinal adenopathy. Reproductive: The prostate  gland measures 6 by 5.6 by 5.2 cm (volume = 90 cm^3). Other: No abdominal wall hernia or abnormality. No abdominopelvic ascites. Musculoskeletal: Degenerative disc disease is identified throughout the lumbar spine. Chronic appearing compression deformity involved L1 vertebra is identified. Unchanged from 01/26/2016. IMPRESSION: 1. Exam positive for small bowel obstruction. Transition point is identified within the right lower quadrant of the abdomen. No free air, free fluid or fluid collections identified. 2. Colonic diverticulosis without acute inflammation. 3.  Aortic Atherosclerosis (ICD10-I70.0). 4. Prostate gland enlargement 5. Small hiatal hernia. Electronically Signed   By: Signa Kell M.D.   On: 10/31/2016 17:44   Dg Abd 2 Views  Result Date: 11/01/2016 CLINICAL DATA:  Small bowel obstruction, continued surveillance. EXAM: ABDOMEN - 2 VIEW COMPARISON:  10/31/2016. FINDINGS: Nasogastric tube tip lies within the stomach. Enteric contrast is seen throughout the colon, and the colon is of normal caliber. There are few mildly prominent small bowel loops in the mid abdomen, without demonstrable air-fluid level. No features of bowel obstruction or free air are observed. The bladder is filled with iodinated contrast. Unchanged osseous structures. IMPRESSION: No features of bowel obstruction or free air can be observed. Electronically Signed   By: Elsie Stain M.D.   On: 11/01/2016 09:06    Anti-infectives: Anti-infectives    Start     Dose/Rate Route Frequency Ordered Stop   11/01/16 1300  piperacillin-tazobactam (ZOSYN) IVPB 3.375 g     3.375 g 12.5 mL/hr over 240 Minutes Intravenous Every 8 hours 11/01/16 0938     10/31/16 2200  piperacillin-tazobactam (ZOSYN) IVPB 3.375 g  Status:  Discontinued     3.375 g 12.5 mL/hr over 240 Minutes Intravenous Every 8 hours 10/31/16 1906 11/01/16 5621      Assessment/Plan:  KUB demonstrates CT contrast within the colon. No sign of bowel obstruction.  Patient has improved and is essentially resolved his small bowel obstruction. This been a recurrent process for him. We've never identified as source in spite of small bowel series and GI consultation. His causes remain unclear but his episodes are short-lived and spontaneously resolved. I offered surgery to this patient but I'm not sure that that is going to be fruitful in identifying a cause but I will see him in the office and discuss it with him again next week.  Lattie Haw, MD, FACS  11/02/2016

## 2016-11-04 ENCOUNTER — Telehealth: Payer: Self-pay

## 2016-11-04 NOTE — Telephone Encounter (Signed)
Post-op call made to patient at this time. Spoke with patient . Post-op interview questions below.  1. How are you feeling?better   2. Is your pain controlled? yes  3. What are you doing for the pain? nothing  4. Are you having any Nausea or Vomiting? no  5. Are you having any Fever or Chills? no  6. Are you having any Constipation or Diarrhea? Had a good bowel movement this morning.  7. Is there any Swelling or Bruising you are concerned about? no  8. Do you have any questions or concerns at this time? no   Discussion: Post op appointment discussed with patient.

## 2016-11-20 ENCOUNTER — Other Ambulatory Visit: Payer: Self-pay

## 2016-11-21 ENCOUNTER — Ambulatory Visit (INDEPENDENT_AMBULATORY_CARE_PROVIDER_SITE_OTHER): Payer: Medicare Other | Admitting: Surgery

## 2016-11-21 ENCOUNTER — Encounter: Payer: Self-pay | Admitting: Surgery

## 2016-11-21 VITALS — BP 128/82 | HR 74 | Temp 98.7°F | Ht 66.0 in | Wt 213.6 lb

## 2016-11-21 DIAGNOSIS — K56609 Unspecified intestinal obstruction, unspecified as to partial versus complete obstruction: Secondary | ICD-10-CM

## 2016-11-21 NOTE — Progress Notes (Signed)
Outpatient Surgical Follow Up  11/21/2016  Kyle Wells is an 83 y.o. male.   CC:SBO  HPI: This patient seen in the hospital previously for small bowel obstruction which resolved spontaneously and promptly.  He has had this happen multiple times before.  He has never had abdominal surgery.  She denies any problems at this time no nausea vomiting no fevers or chills occasional constipation.  He has a good appetite.  He has not lost any weight.  Past Medical History:  Diagnosis Date  . Asthma   . BPH (benign prostatic hyperplasia)   . COPD (chronic obstructive pulmonary disease) (HCC)   . Hypercholesterolemia   . Impingement syndrome of right shoulder   . OSA (obstructive sleep apnea)   . Small bowel obstruction [facility](HCC)     Past Surgical History:  Procedure Laterality Date  . PROSTATE BIOPSY      Family History  Problem Relation Age of Onset  . Asthma Father   . Heart attack Father     Social History:  reports that he has been smoking Cigarettes.  He has been smoking about 0.10 packs per day. He has never used smokeless tobacco. He reports that he does not drink alcohol or use drugs.  Allergies:  Allergies  Allergen Reactions  . Zileuton Hives    [Onset: 10/27/2009]  . Roflumilast Other (See Comments)    insomnia   [Onset: 11/29/2010]    Medications reviewed.   Review of Systems:   Review of Systems  Constitutional: Negative.   HENT: Negative.   Eyes: Negative.   Respiratory: Negative.   Cardiovascular: Negative.   Gastrointestinal: Positive for constipation. Negative for abdominal pain, blood in stool, diarrhea, heartburn, melena, nausea and vomiting.  Genitourinary: Negative.   Musculoskeletal: Negative.   Skin: Negative.   Neurological: Negative.   Endo/Heme/Allergies: Negative.   Psychiatric/Behavioral: Negative.      Physical Exam:  BP 128/82   Pulse 74   Temp 98.7 F (37.1 C) (Oral)   Ht 5\' 6"  (1.676 m)   Wt 213 lb 9.6 oz (96.9 kg)   BMI  34.48 kg/m   Physical Exam  Constitutional: He is oriented to person, place, and time and well-developed, well-nourished, and in no distress. No distress.  HENT:  Head: Normocephalic and atraumatic.  Eyes: Right eye exhibits no discharge. Left eye exhibits no discharge. No scleral icterus.  Neck: Normal range of motion.  Cardiovascular: Normal rate and regular rhythm.   Pulmonary/Chest: Effort normal. No respiratory distress.  Abdominal: Soft. He exhibits distension. There is no tenderness. There is no rebound and no guarding.  Musculoskeletal: Normal range of motion. He exhibits no edema.  Lymphadenopathy:    He has no cervical adenopathy.  Neurological: He is alert and oriented to person, place, and time.  Skin: Skin is warm and dry. He is not diaphoretic. No erythema.  Psychiatric: Mood and affect normal.  Vitals reviewed.     No results found for this or any previous visit (from the past 48 hour(s)). No results found.  Assessment/Plan:  Chart review demonstrates no sign of him having a colonoscopy in the last 5 years.  There is [facility] for Dr. Mechele CollinElliott in the records but that is dated 2014.  I suspect he has had a colonoscopy at that point.  With his recent problems and a negative small bowel series done in January of this year I would recommend him seeing Dr. Mechele CollinElliott for repeat colonoscopy although I think that that will  be low yield.  He will follow-up with [place] on an as-needed basis.  Lattie Haw, MD, FACS

## 2016-11-21 NOTE — Patient Instructions (Addendum)
We will get you back to see Dr. Mechele CollinElliott for a Colonoscopy. I will call you with the appointment details as soon as I have them.  Stay on a Bowel Regimen to help with the occasional constipation. A stool softener and/or miralax daily can help you stay regular. Also, keep hydrated with 72 oz or more of water each day to help with stools.   Small Bowel Obstruction A small bowel obstruction is a blockage in the small bowel. The small bowel, which is also called the small intestine, is a long, slender tube that connects the stomach to the colon. When a person eats and drinks, food and fluids go from the stomach to the small bowel. This is where most of the nutrients in the food and fluids are absorbed. A small bowel obstruction will prevent food and fluids from passing through the small bowel as they normally do during digestion. The small bowel can become partially or completely blocked. This can cause symptoms such as abdominal pain, vomiting, and bloating. If this condition is not treated, it can be dangerous because the small bowel could rupture. What are the causes? Common causes of this condition include:  Scar tissue from previous surgery or radiation treatment.  Recent surgery. This may cause the movements of the bowel to slow down and cause food to block the intestine.  Hernias.  Inflammatory bowel disease (colitis).  Twisting of the bowel (volvulus).  Tumors.  A foreign body.  Slipping of a part of the bowel into another part (intussusception).  What are the signs or symptoms? Symptoms of this condition include:  Abdominal pain. This may be dull cramps or sharp pain. It may occur in one area, or it may be present in the entire abdomen. Pain can range from mild to severe, depending on the degree of obstruction.  Nausea and vomiting. Vomit may be greenish or a yellow bile color.  Abdominal bloating.  Constipation.  Lack of passing gas.  Frequent belching.  Diarrhea. This  may occur if the obstruction is partial and runny stool is able to leak around the obstruction.  How is this diagnosed? This condition may be diagnosed based on a physical exam, medical history, and X-rays of the abdomen. You may also have other tests, such as a CT scan of the abdomen and pelvis. How is this treated? Treatment for this condition depends on the cause and severity of the problem. Treatment options may include:  Bed rest along with fluids and pain medicines that are given through an IV tube inserted into one of your veins. Sometimes, this is all that is needed for the obstruction to improve.  Following a simple diet. In some cases, a clear liquid diet may be required for several days. This allows the bowel to rest.  Placement of a small tube (nasogastric tube) into the stomach. When the bowel is blocked, it usually swells up like a balloon that is filled with air and fluids. The air and fluids may be removed by suction through the nasogastric tube. This can help with pain, discomfort, and nausea. It can also help the obstruction to clear up faster.  Surgery. This may be required if other treatments do not work. Bowel obstruction from a hernia may require early surgery and can be an emergency procedure. Surgery may also be required for scar tissue that causes frequent or severe obstructions.  Follow these instructions at home:  Get plenty of rest.  Follow instructions from your health care provider about eating  restrictions. You may need to avoid solid foods and consume only clear liquids until your condition improves.  Take over-the-counter and prescription medicines only as told by your health care provider.  Keep all follow-up visits as told by your health care provider. This is important. Contact a health care provider if:  You have a fever.  You have chills. Get help right away if:  You have increased pain or cramping.  You vomit blood.  You have uncontrolled  vomiting or nausea.  You cannot drink fluids because of vomiting or pain.  You develop confusion.  You begin feeling very dry or thirsty (dehydrated).  You have severe bloating.  You feel extremely weak or you faint. This information is not intended to replace advice given to you by your health care provider. Make sure you discuss any questions you have with your health care provider. Document Released: 04/02/2005 Document Revised: 09/11/2015 Document Reviewed: 03/10/2014 Elsevier Interactive Patient Education  2017 [employer].

## 2016-11-22 ENCOUNTER — Telehealth: Payer: Self-pay | Admitting: Surgery

## 2016-11-22 NOTE — Telephone Encounter (Signed)
Patient has requested to be referred to Dr Mechele CollinElliott for colonoscopy. I have called and requested an appointment.   Appointment Date: 02/21/17 @ 9:00am--with Fransico SettersKim Mills, NP. Dr Mechele CollinElliott will do the colonoscopy when scheduled.   All clinic notes, demographics, labs and imaging has been faxed to [phone].  Patient has been advised of appointment.

## 2016-12-02 DIAGNOSIS — Z0000 Encounter for general adult medical examination without abnormal findings: Secondary | ICD-10-CM | POA: Insufficient documentation

## 2016-12-10 ENCOUNTER — Observation Stay: Payer: Medicare Other

## 2016-12-10 ENCOUNTER — Encounter: Payer: Self-pay | Admitting: Radiology

## 2016-12-10 ENCOUNTER — Emergency Department: Payer: Medicare Other

## 2016-12-10 ENCOUNTER — Observation Stay
Admission: EM | Admit: 2016-12-10 | Discharge: 2016-12-12 | Disposition: A | Discharge status: Home / Self Care | Payer: Medicare Other | Attending: Surgery | Admitting: Surgery

## 2016-12-10 ENCOUNTER — Other Ambulatory Visit: Payer: Self-pay

## 2016-12-10 DIAGNOSIS — E7800 Pure hypercholesterolemia, unspecified: Secondary | ICD-10-CM | POA: Diagnosis not present

## 2016-12-10 DIAGNOSIS — K429 Umbilical hernia without obstruction or gangrene: Secondary | ICD-10-CM | POA: Insufficient documentation

## 2016-12-10 DIAGNOSIS — Z825 Family history of asthma and other chronic lower respiratory diseases: Secondary | ICD-10-CM | POA: Insufficient documentation

## 2016-12-10 DIAGNOSIS — K4090 Unilateral inguinal hernia, without obstruction or gangrene, not specified as recurrent: Secondary | ICD-10-CM | POA: Insufficient documentation

## 2016-12-10 DIAGNOSIS — Z79899 Other long term (current) drug therapy: Secondary | ICD-10-CM | POA: Insufficient documentation

## 2016-12-10 DIAGNOSIS — K56609 Unspecified intestinal obstruction, unspecified as to partial versus complete obstruction: Secondary | ICD-10-CM | POA: Diagnosis not present

## 2016-12-10 DIAGNOSIS — G4733 Obstructive sleep apnea (adult) (pediatric): Secondary | ICD-10-CM | POA: Insufficient documentation

## 2016-12-10 DIAGNOSIS — K8020 Calculus of gallbladder without cholecystitis without obstruction: Secondary | ICD-10-CM | POA: Diagnosis not present

## 2016-12-10 DIAGNOSIS — I700 Atherosclerosis of aorta: Secondary | ICD-10-CM | POA: Diagnosis not present

## 2016-12-10 DIAGNOSIS — J45909 Unspecified asthma, uncomplicated: Secondary | ICD-10-CM | POA: Insufficient documentation

## 2016-12-10 DIAGNOSIS — R110 Nausea: Secondary | ICD-10-CM | POA: Diagnosis present

## 2016-12-10 DIAGNOSIS — F17210 Nicotine dependence, cigarettes, uncomplicated: Secondary | ICD-10-CM | POA: Insufficient documentation

## 2016-12-10 DIAGNOSIS — E669 Obesity, unspecified: Secondary | ICD-10-CM | POA: Insufficient documentation

## 2016-12-10 DIAGNOSIS — Z9049 Acquired absence of other specified parts of digestive tract: Secondary | ICD-10-CM | POA: Diagnosis not present

## 2016-12-10 DIAGNOSIS — M7541 Impingement syndrome of right shoulder: Secondary | ICD-10-CM | POA: Insufficient documentation

## 2016-12-10 DIAGNOSIS — Z6834 Body mass index (BMI) 34.0-34.9, adult: Secondary | ICD-10-CM | POA: Insufficient documentation

## 2016-12-10 DIAGNOSIS — N401 Enlarged prostate with lower urinary tract symptoms: Secondary | ICD-10-CM | POA: Diagnosis not present

## 2016-12-10 DIAGNOSIS — Z888 Allergy status to other drugs, medicaments and biological substances status: Secondary | ICD-10-CM | POA: Diagnosis not present

## 2016-12-10 DIAGNOSIS — K449 Diaphragmatic hernia without obstruction or gangrene: Secondary | ICD-10-CM | POA: Insufficient documentation

## 2016-12-10 DIAGNOSIS — Z7951 Long term (current) use of inhaled steroids: Secondary | ICD-10-CM | POA: Diagnosis not present

## 2016-12-10 DIAGNOSIS — Z8249 Family history of ischemic heart disease and other diseases of the circulatory system: Secondary | ICD-10-CM | POA: Insufficient documentation

## 2016-12-10 LAB — CBC WITH DIFFERENTIAL/PLATELET
BASOS ABS: 0 10*3/uL (ref 0–0.1)
BASOS PCT: 0 %
EOS PCT: 1 %
Eosinophils Absolute: 0.1 10*3/uL (ref 0–0.7)
HEMATOCRIT: 47.3 % (ref 40.0–52.0)
Hemoglobin: 15.6 g/dL (ref 13.0–18.0)
Lymphocytes Relative: 14 %
Lymphs Abs: 1.5 10*3/uL (ref 1.0–3.6)
MCH: 30.5 pg (ref 26.0–34.0)
MCHC: 32.9 g/dL (ref 32.0–36.0)
MCV: 92.8 fL (ref 80.0–100.0)
MONO ABS: 0.8 10*3/uL (ref 0.2–1.0)
MONOS PCT: 8 %
NEUTROS ABS: 8.2 10*3/uL — AB (ref 1.4–6.5)
Neutrophils Relative %: 77 %
PLATELETS: 206 10*3/uL (ref 150–440)
RBC: 5.1 MIL/uL (ref 4.40–5.90)
RDW: 13.1 % (ref 11.5–14.5)
WBC: 10.5 10*3/uL (ref 3.8–10.6)

## 2016-12-10 LAB — COMPREHENSIVE METABOLIC PANEL
ALBUMIN: 4 g/dL (ref 3.5–5.0)
ALK PHOS: 56 U/L (ref 38–126)
ALT: 19 U/L (ref 17–63)
AST: 32 U/L (ref 15–41)
Anion gap: 9 (ref 5–15)
BILIRUBIN TOTAL: 1.2 mg/dL (ref 0.3–1.2)
BUN: 19 mg/dL (ref 6–20)
CALCIUM: 9.1 mg/dL (ref 8.9–10.3)
CO2: 26 mmol/L (ref 22–32)
CREATININE: 0.95 mg/dL (ref 0.61–1.24)
Chloride: 101 mmol/L (ref 101–111)
GFR calc Af Amer: >60 mL/min (ref >60)
GLUCOSE: 147 mg/dL — AB (ref 65–99)
Potassium: 4.5 mmol/L (ref 3.5–5.1)
Sodium: 136 mmol/L (ref 135–145)
TOTAL PROTEIN: 7.6 g/dL (ref 6.5–8.1)

## 2016-12-10 LAB — URINALYSIS, COMPLETE (UACMP) WITH MICROSCOPIC
BACTERIA UA: NONE SEEN
Bilirubin Urine: NEGATIVE
Glucose, UA: NEGATIVE mg/dL
Hgb urine dipstick: NEGATIVE
KETONES UR: NEGATIVE mg/dL
Leukocytes, UA: NEGATIVE
NITRITE: NEGATIVE
PROTEIN: NEGATIVE mg/dL
RBC / HPF: NONE SEEN RBC/hpf (ref 0–5)
Specific Gravity, Urine: 1.019 (ref 1.005–1.030)
Squamous Epithelial / LPF: NONE SEEN
WBC UA: NONE SEEN WBC/hpf (ref 0–5)
pH: 7 (ref 5.0–8.0)

## 2016-12-10 LAB — TROPONIN I: Troponin I: <0.03 ng/mL (ref <0.03)

## 2016-12-10 LAB — LACTIC ACID, PLASMA: Lactic Acid, Venous: 1.4 mmol/L (ref 0.5–1.9)

## 2016-12-10 LAB — LIPASE, BLOOD: LIPASE: 21 U/L (ref 11–51)

## 2016-12-10 MED ORDER — ONDANSETRON HCL 4 MG/2ML IJ SOLN
4.0000 mg | Freq: Once | INTRAMUSCULAR | Status: AC
  Administered 2016-12-10: 4 mg via INTRAVENOUS
  Filled 2016-12-10: qty 2

## 2016-12-10 MED ORDER — BENZOCAINE 20 % MT SOLN
OROMUCOSAL | Status: AC
  Filled 2016-12-10: qty 5

## 2016-12-10 MED ORDER — ONDANSETRON 4 MG PO TBDP: 4.0000 mg | ORAL_TABLET | Freq: Four times a day (QID) | ORAL | Status: DC | PRN

## 2016-12-10 MED ORDER — LACTATED RINGERS IV SOLN
125.0000 mL/h | INTRAVENOUS | Status: DC
  Administered 2016-12-10 – 2016-12-11 (×3): 125 mL/h via INTRAVENOUS

## 2016-12-10 MED ORDER — HYDROMORPHONE HCL 1 MG/ML IJ SOLN
0.5000 mg | INTRAMUSCULAR | Status: DC | PRN
  Administered 2016-12-10: 0.5 mg via INTRAVENOUS
  Filled 2016-12-10: qty 0.5

## 2016-12-10 MED ORDER — POLYETHYLENE GLYCOL 3350 17 G PO PACK: 17.0000 g | PACK | Freq: Every day | ORAL | Status: DC | PRN

## 2016-12-10 MED ORDER — IOPAMIDOL (ISOVUE-300) INJECTION 61%
100.0000 mL | Freq: Once | INTRAVENOUS | Status: AC | PRN
  Administered 2016-12-10: 100 mL via INTRAVENOUS

## 2016-12-10 MED ORDER — BENZOCAINE (TOPICAL) 20 % EX AERO
INHALATION_SPRAY | Freq: Four times a day (QID) | CUTANEOUS | Status: DC | PRN
  Filled 2016-12-10: qty 57

## 2016-12-10 MED ORDER — MOMETASONE FURO-FORMOTEROL FUM 200-5 MCG/ACT IN AERO
2.0000 | INHALATION_SPRAY | Freq: Two times a day (BID) | RESPIRATORY_TRACT | Status: DC
  Administered 2016-12-10 – 2016-12-12 (×5): 2 via RESPIRATORY_TRACT
  Filled 2016-12-10: qty 8.8

## 2016-12-10 MED ORDER — TIOTROPIUM BROMIDE MONOHYDRATE 18 MCG IN CAPS
18.0000 ug | ORAL_CAPSULE | Freq: Every day | RESPIRATORY_TRACT | Status: DC
  Administered 2016-12-10 – 2016-12-12 (×3): 18 ug via RESPIRATORY_TRACT
  Filled 2016-12-10: qty 5

## 2016-12-10 MED ORDER — SODIUM CHLORIDE 0.9 % IV BOLUS (SEPSIS)
500.0000 mL | Freq: Once | INTRAVENOUS | Status: AC
Start: 2016-12-10 — End: 2016-12-10
  Administered 2016-12-10: 500 mL via INTRAVENOUS

## 2016-12-10 MED ORDER — ALBUTEROL SULFATE (2.5 MG/3ML) 0.083% IN NEBU
2.5000 mg | INHALATION_SOLUTION | Freq: Two times a day (BID) | RESPIRATORY_TRACT | Status: DC
  Administered 2016-12-10 – 2016-12-12 (×5): 2.5 mg via RESPIRATORY_TRACT
  Filled 2016-12-10 (×5): qty 3

## 2016-12-10 MED ORDER — ONDANSETRON HCL 4 MG/2ML IJ SOLN
INTRAMUSCULAR | Status: AC
  Administered 2016-12-10: 4 mg via INTRAVENOUS
  Filled 2016-12-10: qty 2

## 2016-12-10 MED ORDER — ONDANSETRON HCL 4 MG/2ML IJ SOLN: 4.0000 mg | Freq: Four times a day (QID) | INTRAMUSCULAR | Status: DC | PRN

## 2016-12-10 MED ORDER — ONDANSETRON HCL 4 MG/2ML IJ SOLN
4.0000 mg | Freq: Once | INTRAMUSCULAR | Status: AC
  Administered 2016-12-10: 4 mg via INTRAVENOUS

## 2016-12-10 MED ORDER — ENOXAPARIN SODIUM 40 MG/0.4ML ~~LOC~~ SOLN
40.0000 mg | SUBCUTANEOUS | Status: DC
  Administered 2016-12-10 – 2016-12-11 (×2): 40 mg via SUBCUTANEOUS
  Filled 2016-12-10 (×2): qty 0.4

## 2016-12-10 MED ORDER — LIDOCAINE VISCOUS 2 % MT SOLN
15.0000 mL | Freq: Once | OROMUCOSAL | Status: AC
  Administered 2016-12-10: 15 mL via OROMUCOSAL
  Filled 2016-12-10: qty 15

## 2016-12-10 MED ORDER — PANTOPRAZOLE SODIUM 40 MG IV SOLR
40.0000 mg | Freq: Every day | INTRAVENOUS | Status: DC
  Administered 2016-12-10 – 2016-12-11 (×2): 40 mg via INTRAVENOUS
  Filled 2016-12-10 (×2): qty 40

## 2016-12-10 MED ORDER — ALBUTEROL SULFATE HFA 108 (90 BASE) MCG/ACT IN AERS: 2.0000 | INHALATION_SPRAY | Freq: Two times a day (BID) | RESPIRATORY_TRACT | Status: DC

## 2016-12-10 NOTE — ED Notes (Signed)
Pt pulled NG tube out; tried 3x to reinsert; pt did not tolerate and pt on break before trying again.

## 2016-12-10 NOTE — ED Notes (Signed)
Patient transported to CT 

## 2016-12-10 NOTE — ED Notes (Signed)
Pt given urinal to obtain specimen, call bell at bedside. Pt verbalized understanding.

## 2016-12-10 NOTE — ED Notes (Addendum)
NG tube 14 FR reinserted; pt tolerated well with help of hurricane spray and viscous lidocaine. Pt feeling better. Placement verified by auscultation of air in stomach.

## 2016-12-10 NOTE — ED Notes (Signed)
Resumed care from Gilbert[place]asey, [place]. Pt alert & oriented with family at bedside. Pt states he is feeling much better, and is hungry; understands that eating is not appropriate at this time. NAD noted.

## 2016-12-10 NOTE — ED Triage Notes (Signed)
Patient reports nausea and dry heaves since 6 pm last night.

## 2016-12-10 NOTE — H&P (Signed)
Date of Admission:  12/10/2016  Reason for Admission:  Small bowel obstruction  History of Present Illness: Freda MunroJames W Schlick is a 83 y.o. male who presents with a one day history of nausea and abdominal distention.  He has a history of recurrent SBO and was last admitted last month for the same.  He has had negative work up for the source, as he has not had any abdominal surgeries in the past.  He reports that yesterday he started getting nauseous but with no emesis and his abdomen became more distended.  He had a bowel movement yesterday but was smaller than usual.  He has been passing flatus and had flatus this morning.  In the ED, workup with CT scan showed a low grade small bowel obstruction with transition point in the right lower quadrant.  Past Medical History: Past Medical History:  Diagnosis Date  . Asthma   . BPH (benign prostatic hyperplasia)   . COPD (chronic obstructive pulmonary disease) (HCC)   . Hypercholesterolemia   . Impingement syndrome of right shoulder   . OSA (obstructive sleep apnea)   . Small bowel obstruction [facility](HCC)      Past Surgical History: Past Surgical History:  Procedure Laterality Date  . PROSTATE BIOPSY      Home Medications: Prior to Admission medications   Medication Sig Start Date End Date Taking? Authorizing Provider  ADVAIR DISKUS 500-50 MCG/DOSE AEPB Inhale 2 puffs 2 (two) times daily into the lungs.  09/12/14  Yes [provider]  albuterol (PROVENTIL) (2.5 MG/3ML) 0.083% nebulizer solution Inhale 3 mLs into the lungs every 6 (six) hours. 02/21/14  Yes [provider]  finasteride (PROSCAR) 5 MG tablet Take 1 tablet (5 mg total) by mouth daily. 09/21/14  Yes Lattie Hawooper, Richard E, MD  montelukast (SINGULAIR) 10 MG tablet Take 1 tablet by mouth daily. 09/12/14  Yes [provider]  Multiple Vitamins-Minerals (MULTIVITAMIN & MINERAL PO) Take 1 tablet by mouth daily. 11/11/08  Yes [provider]  Naproxen Sodium  (ALEVE) 220 MG CAPS Take 1 capsule by mouth every 6 (six) hours as needed (headache, mild pain).   Yes [provider]  PROAIR HFA 108 (90 BASE) MCG/ACT inhaler Inhale 2 puffs into the lungs 2 (two) times daily. 09/12/14  Yes [provider]  ranitidine (ZANTAC) 150 MG tablet Take 150 mg by mouth daily.    Yes [provider]  SPIRIVA HANDIHALER 18 MCG inhalation capsule Place 2 puffs into inhaler and inhale daily. 09/13/14  Yes [provider]    Allergies: Allergies  Allergen Reactions  . Zileuton Hives    [Onset: 10/27/2009]  . Roflumilast Other (See Comments)    insomnia   [Onset: 11/29/2010]    Social History:  reports that he has been smoking cigarettes.  He has been smoking about 0.10 packs per day. he has never used smokeless tobacco. He reports that he does not drink alcohol or use drugs.   Family History: Family History  Problem Relation Age of Onset  . Asthma Father   . Heart attack Father     Review of Systems: Review of Systems  Constitutional: Negative for chills and fever.  HENT: Negative for hearing loss.   Eyes: Negative for blurred vision.  Respiratory: Negative for shortness of breath.   Cardiovascular: Negative for chest pain.  Gastrointestinal: Positive for nausea. Negative for constipation, diarrhea and vomiting.       Abdominal distention  Genitourinary: Negative for dysuria.  Musculoskeletal: Negative for  myalgias.  Skin: Negative for rash.  Neurological: Negative for dizziness.  Psychiatric/Behavioral: Negative for depression.  All other systems reviewed and are negative.   Physical Exam BP (!) 152/83   Pulse 65   Temp 98.1 F (36.7 C) (Oral)   Resp 20   SpO2 96%  CONSTITUTIONAL: No acute distress HEENT:  Normocephalic, atraumatic, extraocular motion intact. NECK: Trachea is midline, and there is no jugular venous distension.  RESPIRATORY:  Lungs are clear, and breath sounds are equal bilaterally. Normal  respiratory effort without pathologic use of accessory muscles. CARDIOVASCULAR: Heart is regular without murmurs, gallops, or rubs. GI: The abdomen is soft, distended, nontender to palpation.  NG tube in place with dark gastric contents.  MUSCULOSKELETAL:  Normal muscle strength and tone in all four extremities.  No peripheral edema or cyanosis. SKIN: Skin turgor is normal. There are no pathologic skin lesions.  NEUROLOGIC:  Motor and sensation is grossly normal.  Cranial nerves are grossly intact. PSYCH:  Alert and oriented to person, place and time. Affect is normal.  Laboratory Analysis: Results for orders placed or performed during the hospital encounter of 12/10/16 (from the past 24 hour(s))  Comprehensive metabolic panel     Status: Abnormal   Collection Time: 12/10/16  4:36 AM  Result Value Ref Range   Sodium 136 135 - 145 mmol/L   Potassium 4.5 3.5 - 5.1 mmol/L   Chloride 101 101 - 111 mmol/L   CO2 26 22 - 32 mmol/L   Glucose, Bld 147 (H) 65 - 99 mg/dL   BUN 19 6 - 20 mg/dL   Creatinine, Ser 1.610.95 0.61 - 1.24 mg/dL   Calcium 9.1 8.9 - 09.610.3 mg/dL   Total Protein 7.6 6.5 - 8.1 g/dL   Albumin 4.0 3.5 - 5.0 g/dL   AST 32 15 - 41 U/L   ALT 19 17 - 63 U/L   Alkaline Phosphatase 56 38 - 126 U/L   Total Bilirubin 1.2 0.3 - 1.2 mg/dL   GFR calc non Af Amer >60 >60 mL/min   GFR calc Af Amer >60 >60 mL/min   Anion gap 9 5 - 15  Lipase, blood     Status: None   Collection Time: 12/10/16  4:36 AM  Result Value Ref Range   Lipase 21 11 - 51 U/L  CBC with Diff     Status: Abnormal   Collection Time: 12/10/16  4:36 AM  Result Value Ref Range   WBC 10.5 3.8 - 10.6 K/uL   RBC 5.10 4.40 - 5.90 MIL/uL   Hemoglobin 15.6 13.0 - 18.0 g/dL   HCT 04.547.3 40.940.0 - 81.152.0 %   MCV 92.8 80.0 - 100.0 fL   MCH 30.5 26.0 - 34.0 pg   MCHC 32.9 32.0 - 36.0 g/dL   RDW 91.413.1 78.211.5 - 95.614.5 %   Platelets 206 150 - 440 K/uL   Neutrophils Relative % 77 %   Neutro Abs 8.2 (H) 1.4 - 6.5 K/uL   Lymphocytes  Relative 14 %   Lymphs Abs 1.5 1.0 - 3.6 K/uL   Monocytes Relative 8 %   Monocytes Absolute 0.8 0.2 - 1.0 K/uL   Eosinophils Relative 1 %   Eosinophils Absolute 0.1 0 - 0.7 K/uL   Basophils Relative 0 %   Basophils Absolute 0.0 0 - 0.1 K/uL  Troponin I     Status: None   Collection Time: 12/10/16  4:36 AM  Result Value Ref Range   Troponin I <0.03 <0.03 ng/mL  Lactic acid, plasma     Status: None   Collection Time: 12/10/16  4:36 AM  Result Value Ref Range   Lactic Acid, Venous 1.4 0.5 - 1.9 mmol/L  Urinalysis, Complete w Microscopic     Status: Abnormal   Collection Time: 12/10/16  5:36 AM  Result Value Ref Range   Color, Urine YELLOW (A) YELLOW   APPearance CLOUDY (A) CLEAR   Specific Gravity, Urine 1.019 1.005 - 1.030   pH 7.0 5.0 - 8.0   Glucose, UA NEGATIVE NEGATIVE mg/dL   Hgb urine dipstick NEGATIVE NEGATIVE   Bilirubin Urine NEGATIVE NEGATIVE   Ketones, ur NEGATIVE NEGATIVE mg/dL   Protein, ur NEGATIVE NEGATIVE mg/dL   Nitrite NEGATIVE NEGATIVE   Leukocytes, UA NEGATIVE NEGATIVE   RBC / HPF NONE SEEN 0 - 5 RBC/hpf   WBC, UA NONE SEEN 0 - 5 WBC/hpf   Bacteria, UA NONE SEEN NONE SEEN   Squamous Epithelial / LPF NONE SEEN NONE SEEN   Mucus PRESENT    Amorphous Crystal PRESENT     Imaging: Ct Abdomen Pelvis W Contrast  Result Date: 12/10/2016 CLINICAL DATA:  Nausea and dry heaves beginning last night. Evaluate abdominal distension. History of prostatomegaly and small bowel obstruction. EXAM: CT ABDOMEN AND PELVIS WITH CONTRAST TECHNIQUE: Multidetector CT imaging of the abdomen and pelvis was performed using the standard protocol following bolus administration of intravenous contrast. CONTRAST:  ISOVUE-300 IOPAMIDOL (ISOVUE-300) INJECTION 61% COMPARISON:  Acute abdominal series November 01, 2016 and CT abdomen and pelvis October 31, 2016 FINDINGS: LOWER CHEST: Bibasilar atelectasis/scarring. The included heart size is mildly enlarged. No pericardial effusion.  HEPATOBILIARY: Tiny dependent gallstone without CT findings of acute cholecystitis. Normal CT liver. PANCREAS: Normal. SPLEEN: Normal. ADRENALS/URINARY TRACT: Kidneys are orthotopic, demonstrating symmetric enhancement. No nephrolithiasis, hydronephrosis or solid renal masses. Too small to characterize hypodensity RIGHT interpolar kidney. 16 mm RIGHT upper pole hypoechoic benign-appearing cyst. The unopacified ureters are normal in course and caliber. Delayed imaging through the kidneys demonstrates symmetric prompt contrast excretion within the proximal urinary collecting system. Urinary bladder is partially distended and unremarkable. Normal adrenal glands. STOMACH/BOWEL: Mildly inflamed small bowel measuring to 3.7 cm in transaxial dimension. With gradual transition point RIGHT renal pelvis. Small hiatal hernia. The stomach, large bowel are normal in course and caliber without inflammatory changes, sensitivity decreased without oral contrast. Severe descending and sigmoid colonic diverticulosis. In Status post appendectomy. VASCULAR/LYMPHATIC: Aortoiliac vessels are normal in course and caliber. Mild calcific atherosclerosis. No lymphadenopathy by CT size criteria. REPRODUCTIVE: Prostatomegaly invading base of bladder. OTHER: No intraperitoneal free fluid or free air. Small fat containing umbilical hernia. Small fat containing LEFT inguinal hernia. MUSCULOSKELETAL: Nonacute. Geographic sclerotic lesion LEFT acetabulum, possible bone island. Old moderate to severe L1 compression fracture with superior endplate Schmorl's node. Multilevel severe degenerative discs. IMPRESSION: 1. Recurrent small bowel obstruction, transition point in the pelvis, possibly from adhesions. 2. Cholelithiasis without acute cholecystitis. Aortic Atherosclerosis (ICD10-I70.0). Electronically Signed   By: Awilda Metro M.D.   On: 12/10/2016 06:19   Dg Abd Portable 1 View  Result Date: 12/10/2016 CLINICAL DATA:  NG tube placement.  EXAM: PORTABLE ABDOMEN - 1 VIEW COMPARISON:  CT 12/10/2016.  Abdomen 11/01/2016 . FINDINGS: NG tube noted with tip in the stomach. Persistent dilated loops of small bowel noted. Slight improvement from prior CT of 12/10/2016. Air and stool noted in the colon. No free air. Bibasilar atelectasis. Degenerative changes thoracolumbar spine . IMPRESSION: 1. NG tube noted with tip in the  stomach. 2. Persistent dilated loops of small bowel with slight interim improvement from prior CT of 12/10/2016 . 3. Bibasilar atelectasis. Electronically Signed   By: Maisie Fus  Register   On: 12/10/2016 07:15    Assessment and Plan: This is a 83 y.o. male who presents with recurrent episode of low grade small bowel obstruction.  I have independently viewed the patient's imaging study and laboratory studies.  Overall, he has some distended loops of small bowel distally, with early decompressed bowel.  Labs are overall normal.    Discussed with patient that given the findings, this warrants admission to the hospital for management of his small bowel obstruction.  He has NG tube in place already and reports that he feels better with it in.  Will remain NPO with IV fluid hydration and NG tube to suction today.  Will check KUB tomorrow to evaluate his SBO and see if tube could be removed tomorrow or not.  His home inhalers will be resumed here.  Currently no plans for surgery unless there is no improvement.  Patient understands this plan and all of his questions have been answered.   Howie Ill, MD [facility]

## 2016-12-10 NOTE — ED Provider Notes (Signed)
[facility] Emergency Department Provider Note   ____________________________________________   First MD Initiated Contact with Patient 12/10/16 0413     (approximate)  I have reviewed the triage vital signs and the nursing notes.   HISTORY  Chief Complaint Nausea    HPI Freda MunroJames W Spikes is a 83 y.o. male who comes into the hospital today with nausea.  He reports that he has had some chills all day on Sunday and then nausea starting about 4:56 PM yesterday.  The patient has not vomited.  He reports that yesterday afternoon he ate salad and then all night he has been burping and tasting salad.  He denies any fevers or diarrhea.  He reports that his wife just got home from rehab.  The patient denies any abdominal pain but states that his belly feels sore.  He reports that he has had some upper intestinal restriction in the past and had to get his stomach pumped out.  The patient states that he had a small bowel movement yesterday.   Past Medical History:  Diagnosis Date  . Asthma   . BPH (benign prostatic hyperplasia)   . COPD (chronic obstructive pulmonary disease) (HCC)   . Hypercholesterolemia   . Impingement syndrome of right shoulder   . OSA (obstructive sleep apnea)   . Small bowel obstruction [facility](HCC)     Patient Active Problem List   Diagnosis Date Noted  . Nausea   . Obesity, unspecified 09/26/2016  . Obstructive sleep apnea on CPAP 11/02/2015  . Chronic airway obstruction (HCC)   . Intestinal obstruction (HCC)   . Ileus (HCC) 09/19/2014  . Impingement syndrome of shoulder, right 05/13/2014  . Benign localized hyperplasia of prostate with urinary obstruction and other lower urinary tract symptoms (LUTS)(600.21) 03/30/2014  . Hypercholesteremia 03/30/2014  . Sleep apnea 03/03/2012  . Benign localized prostatic hyperplasia with lower urinary tract symptoms (LUTS) 10/16/2011  . Elevated prostate specific antigen (PSA) 10/16/2011  . Enlarged  prostate with lower urinary tract symptoms (LUTS) 10/16/2011  . Incomplete emptying of bladder 10/16/2011  . Increased frequency of urination 10/16/2011  . Asthma with COPD (HCC) 05/31/2011  . Chronic obstructive asthma (HCC) 05/31/2011  . House dust mite allergy 10/20/2010  . Other allergy, other than to medicinal agents 10/20/2010    Past Surgical History:  Procedure Laterality Date  . PROSTATE BIOPSY      Prior to Admission medications   Medication Sig Start Date End Date Taking? Authorizing Provider  ADVAIR DISKUS 500-50 MCG/DOSE AEPB Inhale 2 puffs into the lungs daily. 09/12/14   [provider]  albuterol (PROVENTIL) (2.5 MG/3ML) 0.083% nebulizer solution Inhale 3 mLs into the lungs every 6 (six) hours. 02/21/14   [provider]  finasteride (PROSCAR) 5 MG tablet Take 1 tablet (5 mg total) by mouth daily. 09/21/14   Lattie Hawooper, Richard E, MD  montelukast (SINGULAIR) 10 MG tablet Take 1 tablet by mouth daily. 09/12/14   [provider]  Multiple Vitamins-Minerals (MULTIVITAMIN & MINERAL PO) Take 1 tablet by mouth daily. 11/11/08   [provider]  Naproxen Sodium (ALEVE) 220 MG CAPS Take 1 capsule by mouth every 6 (six) hours as needed (headache, mild pain).    [provider]  PROAIR HFA 108 (90 BASE) MCG/ACT inhaler Inhale 2 puffs into the lungs 2 (two) times daily. 09/12/14   [provider]  ranitidine (ZANTAC) 150 MG tablet Take 150 mg by mouth daily.     [provider]  SPIRIVA HANDIHALER 18 MCG inhalation capsule Place 2 puffs into inhaler and inhale daily. 09/13/14   [provider]    Allergies Zileuton and Roflumilast  Family History  Problem Relation Age of Onset  . Asthma Father   . Heart attack Father     Social History Social History   Tobacco Use  . Smoking status: Current Some Day Smoker    Packs/day: 0.10    Types: Cigarettes  . Smokeless tobacco: Never Used  Substance Use Topics  .  Alcohol use: No  . Drug use: No    Review of Systems  Constitutional: No fever/chills Eyes: No visual changes. ENT: No sore throat. Cardiovascular: Denies chest pain. Respiratory: Denies shortness of breath. Gastrointestinal:  Abdominal soreness and nausea, no vomiting.  No diarrhea.  No constipation. Genitourinary: Negative for dysuria. Musculoskeletal: Negative for back pain. Skin: Negative for rash. Neurological: Negative for headaches, focal weakness or numbness.   ____________________________________________   PHYSICAL EXAM:  VITAL SIGNS: ED Triage Vitals [12/10/16 0355]  Enc Vitals Group     BP 131/82     Pulse Rate (!) 106     Resp 20     Temp 98.1 F (36.7 C)     Temp Source Oral     SpO2 94 %     Weight      Height      Head Circumference      Peak Flow      Pain Score      Pain Loc      Pain Edu?      Excl. in GC?     Constitutional: Alert and oriented. Well appearing and in moderate distress. Eyes: Conjunctivae are normal. PERRL. EOMI. Head: Atraumatic. Nose: No congestion/rhinnorhea. Mouth/Throat: Mucous membranes are moist.  Oropharynx non-erythematous. Cardiovascular: Normal rate, regular rhythm. Grossly normal heart sounds.  Good peripheral circulation. Respiratory: Normal respiratory effort.  No retractions. Lungs CTAB. Gastrointestinal: Soft and nontender.  Abdominal distention.  Positive bowel sounds Musculoskeletal: No lower extremity tenderness nor edema.   Neurologic:  Normal speech and language.  Skin:  Skin is warm, dry and intact.  Psychiatric: Mood and affect are normal.   ____________________________________________   LABS (all labs ordered are listed, but only abnormal results are displayed)  Labs Reviewed  COMPREHENSIVE METABOLIC PANEL - Abnormal; Notable for the following components:      Result Value   Glucose, Bld 147 (*)    All other components within normal limits  CBC WITH DIFFERENTIAL/PLATELET - Abnormal; Notable for  the following components:   Neutro Abs 8.2 (*)    All other components within normal limits  URINALYSIS, COMPLETE (UACMP) WITH MICROSCOPIC - Abnormal; Notable for the following components:   Color, Urine YELLOW (*)    APPearance CLOUDY (*)    All other components within normal limits  LIPASE, BLOOD  TROPONIN I  LACTIC ACID, PLASMA   ____________________________________________  EKG  none ____________________________________________  RADIOLOGY  Ct Abdomen Pelvis W Contrast  Result Date: 12/10/2016 CLINICAL DATA:  Nausea and dry heaves beginning last night. Evaluate abdominal distension. History of prostatomegaly and small bowel obstruction. EXAM: CT ABDOMEN AND PELVIS WITH CONTRAST TECHNIQUE: Multidetector CT imaging of the abdomen and pelvis was performed using the standard protocol following bolus administration of intravenous contrast. CONTRAST:  100mL ISOVUE-300 IOPAMIDOL (ISOVUE-300) INJECTION 61% COMPARISON:  Acute abdominal series November 01, 2016 and CT abdomen and pelvis October 31, 2016 FINDINGS: LOWER CHEST: Bibasilar atelectasis/scarring. The included heart size is mildly enlarged.  No pericardial effusion. HEPATOBILIARY: Tiny dependent gallstone without CT findings of acute cholecystitis. Normal CT liver. PANCREAS: Normal. SPLEEN: Normal. ADRENALS/URINARY TRACT: Kidneys are orthotopic, demonstrating symmetric enhancement. No nephrolithiasis, hydronephrosis or solid renal masses. Too small to characterize hypodensity RIGHT interpolar kidney. 16 mm RIGHT upper pole hypoechoic benign-appearing cyst. The unopacified ureters are normal in course and caliber. Delayed imaging through the kidneys demonstrates symmetric prompt contrast excretion within the proximal urinary collecting system. Urinary bladder is partially distended and unremarkable. Normal adrenal glands. STOMACH/BOWEL: Mildly inflamed small bowel measuring to 3.7 cm in transaxial dimension. With gradual transition point RIGHT  renal pelvis. Small hiatal hernia. The stomach, large bowel are normal in course and caliber without inflammatory changes, sensitivity decreased without oral contrast. Severe descending and sigmoid colonic diverticulosis. In Status post appendectomy. VASCULAR/LYMPHATIC: Aortoiliac vessels are normal in course and caliber. Mild calcific atherosclerosis. No lymphadenopathy by CT size criteria. REPRODUCTIVE: Prostatomegaly invading base of bladder. OTHER: No intraperitoneal free fluid or free air. Small fat containing umbilical hernia. Small fat containing LEFT inguinal hernia. MUSCULOSKELETAL: Nonacute. Geographic sclerotic lesion LEFT acetabulum, possible bone island. Old moderate to severe L1 compression fracture with superior endplate Schmorl's node. Multilevel severe degenerative discs. IMPRESSION: 1. Recurrent small bowel obstruction, transition point in the pelvis, possibly from adhesions. 2. Cholelithiasis without acute cholecystitis. Aortic Atherosclerosis (ICD10-I70.0). Electronically Signed   By: Awilda Metro M.D.   On: 12/10/2016 06:19    ____________________________________________   PROCEDURES  Procedure(s) performed: None  Procedures  Critical Care performed: No  ____________________________________________   INITIAL IMPRESSION / ASSESSMENT AND PLAN / ED COURSE  As part of my medical decision making, I reviewed the following data within the electronic MEDICAL RECORD NUMBER Notes from prior ED visits and [place] Controlled Substance Database   This is an 83 year old male who comes into the hospital today with some nausea and abdominal distention.  The patient does have a history of bowel obstructions.  My differential diagnosis includes bowel obstruction, colitis, gastroenteritis.  I did check the patient's blood work and it was unremarkable.  I sent the patient for a CT scan and he was found to have a recurrent small bowel obstruction with a transition point in the pelvis.  The  patient also has some gallstones.  Looking back at the patient's hospital notes he had been admitted to the hospital in October for the same. The patient will be admitted to surgery.      ____________________________________________   FINAL CLINICAL IMPRESSION(S) / ED DIAGNOSES  Final diagnoses:  Small bowel obstruction (HCC)  Nausea     ED Discharge Orders    None       Note:  This document was prepared using Dragon voice recognition software and may include unintentional dictation errors.    Rebecka Apley, MD 12/10/16 [phone]

## 2016-12-11 DIAGNOSIS — K56609 Unspecified intestinal obstruction, unspecified as to partial versus complete obstruction: Secondary | ICD-10-CM | POA: Diagnosis not present

## 2016-12-11 LAB — BASIC METABOLIC PANEL
Anion gap: 8 (ref 5–15)
BUN: 16 mg/dL (ref 6–20)
CO2: 25 mmol/L (ref 22–32)
Calcium: 8.7 mg/dL — ABNORMAL LOW (ref 8.9–10.3)
Chloride: 105 mmol/L (ref 101–111)
Creatinine, Ser: 0.89 mg/dL (ref 0.61–1.24)
GFR calc Af Amer: >60 mL/min (ref >60)
GLUCOSE: 86 mg/dL (ref 65–99)
POTASSIUM: 3.6 mmol/L (ref 3.5–5.1)
Sodium: 138 mmol/L (ref 135–145)

## 2016-12-11 LAB — MAGNESIUM: Magnesium: 1.9 mg/dL (ref 1.7–2.4)

## 2016-12-11 MED ORDER — KCL IN DEXTROSE-NACL 20-5-0.45 MEQ/L-%-% IV SOLN
INTRAVENOUS | Status: DC
  Administered 2016-12-11 – 2016-12-12 (×3): via INTRAVENOUS
  Filled 2016-12-11 (×4): qty 1000

## 2016-12-11 MED ORDER — ACETAMINOPHEN 500 MG PO TABS
1000.0000 mg | ORAL_TABLET | Freq: Four times a day (QID) | ORAL | Status: DC | PRN
  Administered 2016-12-11: 1000 mg via ORAL
  Filled 2016-12-11: qty 2

## 2016-12-11 NOTE — Care Management Obs Status (Signed)
MEDICARE OBSERVATION STATUS NOTIFICATION   Patient Details  Name: Kyle Wells MRN: [id number] Date of Birth: 1933-10-09   Medicare Observation Status Notification Given:  Yes    Chapman FitchBOWEN, [name], RN 12/11/2016, 4:06 PM

## 2016-12-11 NOTE — Progress Notes (Signed)
NG tube removed per MD order

## 2016-12-11 NOTE — Progress Notes (Signed)
12/11/2016  Subjective: No acute events.  Passing flatus.  No further pain or nausea.  Vital signs: Temp:  [97.4 F (36.3 C)-98.6 F (37 C)] 97.8 F (36.6 C) (11/14 0550) Pulse Rate:  [54-70] 54 (11/14 0550) Resp:  [16-19] 16 (11/14 0550) BP: (113-118)/(63-74) 116/72 (11/14 0550) SpO2:  [93 %-96 %] 93 % (11/14 0550) Weight:  [95.6 kg (210 lb 11.2 oz)] 95.6 kg (210 lb 11.2 oz) (11/13 1002)   Intake/Output: 11/13 0701 - 11/14 0700 In: 2317.8 [I.V.:2317.8] Out: 2400 [Urine:1450; Emesis/NG output:950] Last BM Date: 12/09/16  Physical Exam: Constitutional: No acute distress Abdomen:  Soft, nondistended, nontender to palpation.  Labs:  Recent Labs    12/10/16 0436  WBC 10.5  HGB 15.6  HCT 47.3  PLT 206   Recent Labs    12/10/16 0436 12/11/16 0416  NA 136 138  K 4.5 3.6  CL 101 105  CO2 26 25  GLUCOSE 147* 86  BUN 19 16  CREATININE 0.95 0.89  CALCIUM 9.1 8.7*   No results for input(s): LABPROT, INR in the last 72 hours.  Imaging: No results found.  Assessment/Plan: 83 yo male with SBO, improving.  --will do clamp trial today for 4 hrs and possibly remove NG tube. --If Ng tube removed, then will start on clears today. --OOB, ambulate.   Howie IllJose Luis Kaydi Kley, MD [facility]

## 2016-12-12 DIAGNOSIS — K56609 Unspecified intestinal obstruction, unspecified as to partial versus complete obstruction: Secondary | ICD-10-CM | POA: Diagnosis not present

## 2016-12-12 MED ORDER — MONTELUKAST SODIUM 10 MG PO TABS
10.0000 mg | ORAL_TABLET | Freq: Every day | ORAL | Status: DC
  Administered 2016-12-12: 10 mg via ORAL
  Filled 2016-12-12: qty 1

## 2016-12-12 MED ORDER — FINASTERIDE 5 MG PO TABS
5.0000 mg | ORAL_TABLET | Freq: Every day | ORAL | Status: DC
  Administered 2016-12-12: 5 mg via ORAL
  Filled 2016-12-12: qty 1

## 2016-12-12 NOTE — Progress Notes (Signed)
Per MD okay for RN to advance diet to full liquids and later to heart healthy diet.

## 2016-12-12 NOTE — Progress Notes (Signed)
Len BlalockJames W Romagnoli  A and O x 4. VSS. Pt tolerating diet well. No complaints of pain or nausea. IV removed intact. Pt voiced understanding of discharge instructions with no further questions. Pt discharged via wheelchair with axillary.    Allergies as of 12/12/2016      Reactions   Zileuton Hives   [Onset: 10/27/2009]   Roflumilast Other (See Comments)   insomnia   [Onset: 11/29/2010]      Medication List    TAKE these medications   ADVAIR DISKUS 500-50 MCG/DOSE Aepb Generic drug:  Fluticasone-Salmeterol Inhale 2 puffs 2 (two) times daily into the lungs.   ALEVE 220 MG Caps Generic drug:  Naproxen Sodium Take 1 capsule by mouth every 6 (six) hours as needed (headache, mild pain).   finasteride 5 MG tablet Commonly known as:  PROSCAR Take 1 tablet (5 mg total) by mouth daily.   montelukast 10 MG tablet Commonly known as:  SINGULAIR Take 1 tablet by mouth daily.   MULTIVITAMIN & MINERAL PO Take 1 tablet by mouth daily.   albuterol (2.5 MG/3ML) 0.083% nebulizer solution Commonly known as:  PROVENTIL Inhale 3 mLs into the lungs every 6 (six) hours.   PROAIR HFA 108 (90 Base) MCG/ACT inhaler Generic drug:  albuterol Inhale 2 puffs into the lungs 2 (two) times daily.   ranitidine 150 MG tablet Commonly known as:  ZANTAC Take 150 mg by mouth daily.   SPIRIVA HANDIHALER 18 MCG inhalation capsule Generic drug:  tiotropium Place 2 puffs into inhaler and inhale daily.       Vitals:   12/12/16 0805 12/12/16 1100  BP:  110/62  Pulse:  (!) 58  Resp:  17  Temp:  98 F (36.7 C)  SpO2: 93% 94%    Suzzanne CloudJuan G Rodriguez Ornelas

## 2016-12-12 NOTE — Discharge Summary (Signed)
Patient ID: Kyle Wells MRN: [id number] DOB/AGE: Jun 27, 1933 83 y.o.  Admit date: 12/10/2016 Discharge date: 12/12/2016   Discharge Diagnoses:  Active Problems:   Small bowel obstruction (HCC)   Procedures:  None  Hospital Course: Patient was admitted on 11/13 with small bowel obstruction.  He has had prior episodes but unclear etiology.  NG tube was placed and was NPO.  He regained bowel function on 11/14 and had a successful NG clamping trial.  NG tube was removed on 11/14 and his diet was slowly advanced.  He was tolerating a regular diet and was having flatus, without any further pain or nausea.  Consults: None  Disposition: 01-Home or Self Care  Discharge Instructions    Call MD for:  persistant nausea and vomiting   Complete by:  As directed    Call MD for:  severe uncontrolled pain   Complete by:  As directed    Diet - low sodium heart healthy   Complete by:  As directed    Discharge instructions   Complete by:  As directed    May take dulcolax over the counter for constipation.   Increase activity slowly   Complete by:  As directed      Allergies as of 12/12/2016      Reactions   Zileuton Hives   [Onset: 10/27/2009]   Roflumilast Other (See Comments)   insomnia   [Onset: 11/29/2010]      Medication List    TAKE these medications   ADVAIR DISKUS 500-50 MCG/DOSE Aepb Generic drug:  Fluticasone-Salmeterol Inhale 2 puffs 2 (two) times daily into the lungs.   ALEVE 220 MG Caps Generic drug:  Naproxen Sodium Take 1 capsule by mouth every 6 (six) hours as needed (headache, mild pain).   finasteride 5 MG tablet Commonly known as:  PROSCAR Take 1 tablet (5 mg total) by mouth daily.   montelukast 10 MG tablet Commonly known as:  SINGULAIR Take 1 tablet by mouth daily.   MULTIVITAMIN & MINERAL PO Take 1 tablet by mouth daily.   albuterol (2.5 MG/3ML) 0.083% nebulizer solution Commonly known as:  PROVENTIL Inhale 3 mLs into the lungs every 6 (six)  hours.   PROAIR HFA 108 (90 Base) MCG/ACT inhaler Generic drug:  albuterol Inhale 2 puffs into the lungs 2 (two) times daily.   ranitidine 150 MG tablet Commonly known as:  ZANTAC Take 150 mg by mouth daily.   SPIRIVA HANDIHALER 18 MCG inhalation capsule Generic drug:  tiotropium Place 2 puffs into inhaler and inhale daily.      Follow-up Information    Lattie Hawooper, Richard E, MD Follow up in 3 week(s).   Specialty:  Surgery Why:  For discussion about his small bowel obstruction. Contact information: [address] [place] [address] [phone]

## 2016-12-26 ENCOUNTER — Encounter: Payer: Self-pay | Admitting: Surgery

## 2016-12-26 ENCOUNTER — Ambulatory Visit (INDEPENDENT_AMBULATORY_CARE_PROVIDER_SITE_OTHER): Payer: Medicare Other | Admitting: Surgery

## 2016-12-26 ENCOUNTER — Other Ambulatory Visit: Payer: Self-pay

## 2016-12-26 VITALS — BP 138/84 | HR 86 | Temp 97.3°F | Ht 66.0 in | Wt 214.0 lb

## 2016-12-26 DIAGNOSIS — K56609 Unspecified intestinal obstruction, unspecified as to partial versus complete obstruction: Secondary | ICD-10-CM | POA: Diagnosis not present

## 2016-12-26 NOTE — Patient Instructions (Signed)
For your constipation, you will want to start on Miralax 17 grams (1 capful) 1-2 times daily. This medication is available at any drug store and is over-the-counter. Your goal is to have a soft bowel movement without straining and feel as though your bowels become emptied properly.  You will need to increase your water intake to approximately 72 ounces daily to allow this medication to work properly.  If you have any questions or concerns, please call our office.  Polyethylene Glycol powder What is this medicine? POLYETHYLENE GLYCOL 3350 (pol ee ETH i leen; GLYE col) powder is a laxative used to treat constipation. It increases the amount of water in the stool. Bowel movements become easier and more frequent. This medicine may be used for other purposes; ask your health care provider or pharmacist if you have questions. COMMON BRAND NAME(S): Sharlyn Bologna, GlycoLax, MiraLax, Smooth LAX, [employer] What should I tell my health care provider before I take this medicine? They need to know if you have any of these conditions: -a history of blockage of the stomach or intestine -current abdomen distension or pain -difficulty swallowing -diverticulitis, ulcerative colitis, or other chronic bowel disease -phenylketonuria -an unusual or allergic reaction to polyethylene glycol, other medicines, dyes, or preservatives -pregnant or trying to get pregnant -breast-feeding How should I use this medicine? Take this medicine by mouth. The bottle has a measuring cap that is marked with a line. Pour the powder into the cap up to the marked line (the dose is about 1 heaping tablespoon). Add the powder in the cap to a full glass (4 to 8 ounces or 120 to 240 ml) of water, juice, soda, coffee or tea. Mix the powder well. Drink the solution. Take exactly as directed. Do not take your medicine more often than directed. Talk to your pediatrician regarding the use of this medicine in children. Special care may be  needed. Overdosage: If you think you have taken too much of this medicine contact a poison control center or emergency room at once. NOTE: This medicine is only for you. Do not share this medicine with others. What if I miss a dose? If you miss a dose, take it as soon as you can. If it is almost time for your next dose, take only that dose. Do not take double or extra doses. What may interact with this medicine? Interactions are not expected. This list may not describe all possible interactions. Give your health care provider a list of all the medicines, herbs, non-prescription drugs, or dietary supplements you use. Also tell them if you smoke, drink alcohol, or use illegal drugs. Some items may interact with your medicine. What should I watch for while using this medicine? Do not use for more than 2 weeks without advice from your doctor or health care professional. It can take 2 to 4 days to have a bowel movement and to experience improvement in constipation. See your health care professional for any changes in bowel habits, including constipation, that are severe or last longer than three weeks. Always take this medicine with plenty of water. What side effects may I notice from receiving this medicine? Side effects that you should report to your doctor or health care professional as soon as possible: -diarrhea -difficulty breathing -itching of the skin, hives, or skin rash -severe bloating, pain, or distension of the stomach -vomiting Side effects that usually do not require medical attention (report to your doctor or health care professional if they continue or are  bothersome): -bloating or gas -lower abdominal discomfort or cramps -nausea This list may not describe all possible side effects. Call your doctor for medical advice about side effects. You may report side effects to FDA at 1-800-FDA-1088. Where should I keep my medicine? Keep out of the reach of children. Store between 15 and  30 degrees C (59 and 86 degrees F). Throw away any unused medicine after the expiration date. NOTE: This sheet is a summary. It may not cover all possible information. If you have questions about this medicine, talk to your doctor, pharmacist, or health care provider.  2018 Elsevier/Gold Standard (2007-08-17 16:50:45)

## 2016-12-26 NOTE — Progress Notes (Signed)
Outpatient Surgical Follow Up  12/26/2016  Kyle Wells is an 83 y.o. male.   TL:XBWI  HPI: This a patient with repeated admissions to the hospital for partial small bowel obstruction which resolved spontaneously with the aid of a nasogastric tube.  He has had no prior abdominal surgeries.  He has had extensive workup in the past including multiple CT scans as well as a small bowel series.  None of these have shown an obvious site of obstruction.  Patient currently has no problems.  He has had no pain no nausea vomiting and is having normal bowel movements.  Since his last hospitalization 10 days ago he has started using MiraLAX every day which he has never done before.  He feels better and has not had any constipation issues.  He is accompanied today by a different family member than I have met before.  She takes notes.  Past Medical History:  Diagnosis Date  . Asthma   . BPH (benign prostatic hyperplasia)   . COPD (chronic obstructive pulmonary disease) ([facility])   . Hypercholesterolemia   . Impingement syndrome of right shoulder   . OSA (obstructive sleep apnea)   . Small bowel obstruction [facility])     Past Surgical History:  Procedure Laterality Date  . PROSTATE BIOPSY      Family History  Problem Relation Age of Onset  . Asthma Father   . Heart attack Father     Social History:  reports that he has been smoking cigarettes.  He has been smoking about 0.10 packs per day. he has never used smokeless tobacco. He reports that he does not drink alcohol or use drugs.  Allergies:  Allergies  Allergen Reactions  . Zileuton Hives    [Onset: 10/27/2009]  . Roflumilast Other (See Comments)    insomnia   [Onset: 11/29/2010]    Medications reviewed.   Review of Systems:   Review of Systems  Constitutional: Negative.   HENT: Negative.   Eyes: Negative.   Respiratory: Negative.   Cardiovascular: Negative.   Gastrointestinal: Positive for constipation, nausea and vomiting.  Negative for abdominal pain, diarrhea and heartburn.  Genitourinary: Negative.   Musculoskeletal: Negative.   Skin: Negative.   Neurological: Negative.   Endo/Heme/Allergies: Negative.   Psychiatric/Behavioral: Negative.      Physical Exam:  There were no vitals taken for this visit.  Physical Exam  Constitutional: He is oriented to person, place, and time and well-developed, well-nourished, and in no distress. No distress.  HENT:  Head: Normocephalic and atraumatic.  Eyes: Pupils are equal, round, and reactive to light. Right eye exhibits no discharge. Left eye exhibits no discharge. No scleral icterus.  Neck: Normal range of motion.  Abdominal: Soft. He exhibits no distension. There is no tenderness. There is no rebound and no guarding.  Lymphadenopathy:    He has no cervical adenopathy.  Neurological: He is alert and oriented to person, place, and time.  Skin: Skin is warm and dry. He is not diaphoretic. No erythema.  Psychiatric: Mood and affect normal.  Vitals reviewed.     No results found for this or any previous visit (from the past 48 hour(s)). No results found.  Assessment/Plan:  I reviewed the studies from the recent hospitalization.  I discussed with he and his family member the fact that he has had multiple CT scans and a small bowel series and a colonoscopy none of which have identified an obvious source for his recurrent admissions to the hospital for  possible bowel obstruction that spontaneously resolves.  He has had no prior surgery to suggest that this is typical adhesive disease.  With these findings my only other suggestion would be a laparoscopy.  We have discussed previously the same recommendations.  The potential for laparoscopy and conversion to an open procedure should something be identified or the inability to proceed safely is encountered.  The risks of bleeding and infection and then negative laparotomy laparoscopy were all reviewed with him he  still wishes to think about this and is not ready to schedule any sort of surgery.  He will follow-up with me in 3 weeks as the potential for this being constipation related in someway and responding to daily MiraLAX might be of benefit.  Florene Glen, MD, FACS

## 2016-12-30 ENCOUNTER — Inpatient Hospital Stay: Payer: Medicare Other | Admitting: Surgery

## 2017-01-16 ENCOUNTER — Ambulatory Visit: Payer: Medicare Other | Admitting: Surgery

## 2017-01-16 ENCOUNTER — Encounter: Payer: Self-pay | Admitting: Surgery

## 2017-01-16 VITALS — BP 117/74 | HR 78 | Temp 97.8°F | Ht 66.0 in | Wt 214.0 lb

## 2017-01-16 DIAGNOSIS — K56609 Unspecified intestinal obstruction, unspecified as to partial versus complete obstruction: Secondary | ICD-10-CM | POA: Diagnosis not present

## 2017-01-16 NOTE — Progress Notes (Signed)
Outpatient Surgical Follow Up  01/16/2017  Kyle Wells is an 83 y.o. male.   CC: reCurrent bowel obstructions  HPI: The patient has been admitted to the hospital multiple times for recurrent bowel obstructions.  Of note he has had not had prior surgery.  Workup in the past included colonoscopy and small bowel series and multiple CT scans.  His small bowel obstructions have always spontaneously resolved promptly with the use of nasogastric tube.  He has often had signs of constipation.  At his last visit it was discussed about trying MiraLAX and Colace in order to control his constipation.  Since using that he is complained of having 2 stools per day that are "mushy" but he is not required return to the hospital for a bowel obstruction.  Past Medical History:  Diagnosis Date  . Asthma   . BPH (benign prostatic hyperplasia)   . COPD (chronic obstructive pulmonary disease) ([facility])   . Hypercholesterolemia   . Impingement syndrome of right shoulder   . OSA (obstructive sleep apnea)   . Small bowel obstruction [facility](HCC)     Past Surgical History:  Procedure Laterality Date  . PROSTATE BIOPSY      Family History  Problem Relation Age of Onset  . Asthma Father   . Heart attack Father     Social History:  reports that he has been smoking cigarettes.  He has been smoking about 0.10 packs per day. he has never used smokeless tobacco. He reports that he does not drink alcohol or use drugs.  Allergies:  Allergies  Allergen Reactions  . Zileuton Hives    [Onset: 10/27/2009] [Onset: 10/27/2009] [Onset: 10/27/2009]  [Onset: 10/27/2009]  . Roflumilast Other (See Comments)    insomnia   [Onset: 11/29/2010] insomnia [Onset: 11/29/2010] Other reaction(s): Other (See Comments) insomnia [Onset: 11/29/2010]  insomnia [Onset: 11/29/2010]    Medications reviewed.   Review of Systems:   Review of Systems  Constitutional: Negative.   HENT: Negative.   Eyes: Negative.    Respiratory: Negative.   Cardiovascular: Negative.   Gastrointestinal: Negative for abdominal pain, blood in stool, constipation, diarrhea, heartburn, melena, nausea and vomiting.  Genitourinary: Negative.   Musculoskeletal: Negative.   Skin: Negative.   Neurological: Negative.   Endo/Heme/Allergies: Negative.   Psychiatric/Behavioral: Negative.      Physical Exam:  BP 117/74   Pulse 78   Temp 97.8 F (36.6 C) (Oral)   Ht 5\' 6"  (1.676 m)   Wt 214 lb (97.1 kg)   BMI 34.54 kg/m   Physical Exam  Constitutional: He is oriented to person, place, and time and well-developed, well-nourished, and in no distress. No distress.  HENT:  Head: Normocephalic and atraumatic.  Eyes: Pupils are equal, round, and reactive to light. Right eye exhibits no discharge. Left eye exhibits no discharge. No scleral icterus.  Neck: No JVD present.  Cardiovascular: Normal rate and regular rhythm.  Pulmonary/Chest: Effort normal. No respiratory distress.  Abdominal: Soft. He exhibits no distension. There is no tenderness. There is no rebound and no guarding.  Musculoskeletal: Normal range of motion. He exhibits no edema or tenderness.  Lymphadenopathy:    He has no cervical adenopathy.  Neurological: He is alert and oriented to person, place, and time. Gait normal.  Skin: Skin is warm and dry. No rash noted. He is not diaphoretic. No erythema.  Psychiatric: Mood and affect normal.  Vitals reviewed.     No results found for this or any previous visit (from the past  48 hour(s)). No results found.  Assessment/Plan:  Patient has been utilizing laxatives on a daily basis such as stool softeners and MiraLAX.  He has not required a trip to the emergency room or to be admitted for bowel obstruction since starting this regimen.  He is concerned that his stool is too "mushy" but it has kept him out of the hospital.  I suggested that if he was worried about it he could stop the Colace for a few days and see  if that makes any difference but the end result is that he is staying out of the hospital.  He will call us in a couple of weeks with his progress.  Lattie Hawichard E Maurice Ramseur, MD, FACS

## 2017-01-16 NOTE — Patient Instructions (Signed)
Please continue with Miralax daily. You may stop the stool Colace however be sure to avoid constipation.    Please call our office in a few weeks and let us know how you are doing.

## 2017-05-06 ENCOUNTER — Other Ambulatory Visit: Payer: Self-pay

## 2017-05-06 ENCOUNTER — Emergency Department: Payer: Medicare Other

## 2017-05-06 ENCOUNTER — Inpatient Hospital Stay
Admission: EM | Admit: 2017-05-06 | Discharge: 2017-05-09 | DRG: 389 | Disposition: A | Discharge status: Home / Self Care | Payer: Medicare Other | Attending: Internal Medicine | Admitting: Internal Medicine

## 2017-05-06 DIAGNOSIS — R531 Weakness: Secondary | ICD-10-CM

## 2017-05-06 DIAGNOSIS — J441 Chronic obstructive pulmonary disease with (acute) exacerbation: Secondary | ICD-10-CM | POA: Diagnosis present

## 2017-05-06 DIAGNOSIS — K567 Ileus, unspecified: Secondary | ICD-10-CM | POA: Diagnosis not present

## 2017-05-06 DIAGNOSIS — F17210 Nicotine dependence, cigarettes, uncomplicated: Secondary | ICD-10-CM | POA: Diagnosis present

## 2017-05-06 DIAGNOSIS — K56600 Partial intestinal obstruction, unspecified as to cause: Secondary | ICD-10-CM | POA: Diagnosis not present

## 2017-05-06 DIAGNOSIS — Z7951 Long term (current) use of inhaled steroids: Secondary | ICD-10-CM

## 2017-05-06 DIAGNOSIS — Z9981 Dependence on supplemental oxygen: Secondary | ICD-10-CM

## 2017-05-06 DIAGNOSIS — G4733 Obstructive sleep apnea (adult) (pediatric): Secondary | ICD-10-CM | POA: Diagnosis present

## 2017-05-06 DIAGNOSIS — E7800 Pure hypercholesterolemia, unspecified: Secondary | ICD-10-CM | POA: Diagnosis present

## 2017-05-06 DIAGNOSIS — Z8249 Family history of ischemic heart disease and other diseases of the circulatory system: Secondary | ICD-10-CM

## 2017-05-06 DIAGNOSIS — J40 Bronchitis, not specified as acute or chronic: Secondary | ICD-10-CM | POA: Diagnosis present

## 2017-05-06 DIAGNOSIS — N400 Enlarged prostate without lower urinary tract symptoms: Secondary | ICD-10-CM | POA: Diagnosis present

## 2017-05-06 DIAGNOSIS — R109 Unspecified abdominal pain: Secondary | ICD-10-CM

## 2017-05-06 DIAGNOSIS — Z825 Family history of asthma and other chronic lower respiratory diseases: Secondary | ICD-10-CM

## 2017-05-06 DIAGNOSIS — Z888 Allergy status to other drugs, medicaments and biological substances status: Secondary | ICD-10-CM

## 2017-05-06 DIAGNOSIS — K56609 Unspecified intestinal obstruction, unspecified as to partial versus complete obstruction: Secondary | ICD-10-CM | POA: Diagnosis present

## 2017-05-06 LAB — COMPREHENSIVE METABOLIC PANEL
ALK PHOS: 64 U/L (ref 38–126)
ALT: 22 U/L (ref 17–63)
AST: 25 U/L (ref 15–41)
Albumin: 3.9 g/dL (ref 3.5–5.0)
Anion gap: 5 (ref 5–15)
BUN: 24 mg/dL — AB (ref 6–20)
CALCIUM: 9.2 mg/dL (ref 8.9–10.3)
CHLORIDE: 102 mmol/L (ref 101–111)
CO2: 29 mmol/L (ref 22–32)
CREATININE: 0.91 mg/dL (ref 0.61–1.24)
Glucose, Bld: 142 mg/dL — ABNORMAL HIGH (ref 65–99)
Potassium: 3.7 mmol/L (ref 3.5–5.1)
Sodium: 136 mmol/L (ref 135–145)
TOTAL PROTEIN: 7.4 g/dL (ref 6.5–8.1)
Total Bilirubin: 1 mg/dL (ref 0.3–1.2)

## 2017-05-06 LAB — URINALYSIS, COMPLETE (UACMP) WITH MICROSCOPIC
Bilirubin Urine: NEGATIVE
Glucose, UA: NEGATIVE mg/dL
HGB URINE DIPSTICK: NEGATIVE
Ketones, ur: 5 mg/dL — AB
NITRITE: NEGATIVE
PROTEIN: NEGATIVE mg/dL
Specific Gravity, Urine: 1.025 (ref 1.005–1.030)
pH: 6 (ref 5.0–8.0)

## 2017-05-06 LAB — CBC WITH DIFFERENTIAL/PLATELET
BASOS ABS: 0 10*3/uL (ref 0–0.1)
Basophils Relative: 0 %
EOS ABS: 0.1 10*3/uL (ref 0–0.7)
EOS PCT: 0 %
HCT: 47.5 % (ref 40.0–52.0)
HEMOGLOBIN: 15.6 g/dL (ref 13.0–18.0)
Lymphocytes Relative: 9 %
Lymphs Abs: 1.1 10*3/uL (ref 1.0–3.6)
MCH: 30.5 pg (ref 26.0–34.0)
MCHC: 32.8 g/dL (ref 32.0–36.0)
MCV: 93 fL (ref 80.0–100.0)
Monocytes Absolute: 1 10*3/uL (ref 0.2–1.0)
Monocytes Relative: 8 %
NEUTROS PCT: 83 %
Neutro Abs: 10.7 10*3/uL — ABNORMAL HIGH (ref 1.4–6.5)
Platelets: 197 10*3/uL (ref 150–440)
RBC: 5.11 MIL/uL (ref 4.40–5.90)
RDW: 13.7 % (ref 11.5–14.5)
WBC: 12.8 10*3/uL — AB (ref 3.8–10.6)

## 2017-05-06 LAB — TROPONIN I
Troponin I: <0.03 ng/mL (ref <0.03)
Troponin I: <0.03 ng/mL (ref <0.03)

## 2017-05-06 LAB — BRAIN NATRIURETIC PEPTIDE: B NATRIURETIC PEPTIDE 5: 22 pg/mL (ref 0.0–100.0)

## 2017-05-06 LAB — TSH: TSH: 0.385 u[IU]/mL (ref 0.350–4.500)

## 2017-05-06 MED ORDER — IPRATROPIUM-ALBUTEROL 0.5-2.5 (3) MG/3ML IN SOLN
3.0000 mL | Freq: Once | RESPIRATORY_TRACT | Status: AC
  Administered 2017-05-06: 3 mL via RESPIRATORY_TRACT
  Filled 2017-05-06: qty 3

## 2017-05-06 MED ORDER — IOHEXOL 300 MG/ML  SOLN
100.0000 mL | Freq: Once | INTRAMUSCULAR | Status: AC | PRN
  Administered 2017-05-06: 100 mL via INTRAVENOUS

## 2017-05-06 MED ORDER — ONDANSETRON HCL 4 MG/2ML IJ SOLN
4.0000 mg | Freq: Once | INTRAMUSCULAR | Status: AC
  Administered 2017-05-06: 4 mg via INTRAVENOUS
  Filled 2017-05-06: qty 2

## 2017-05-06 MED ORDER — SODIUM CHLORIDE 0.9 % IV BOLUS
1000.0000 mL | Freq: Once | INTRAVENOUS | Status: AC
  Administered 2017-05-06: 1000 mL via INTRAVENOUS

## 2017-05-06 MED ORDER — IOPAMIDOL (ISOVUE-300) INJECTION 61%: 100.0000 mL | Freq: Once | INTRAVENOUS | Status: DC | PRN

## 2017-05-06 MED ORDER — ONDANSETRON 4 MG PO TBDP: 4.0000 mg | ORAL_TABLET | Freq: Three times a day (TID) | ORAL | 0 refills | Status: DC | PRN

## 2017-05-06 MED ORDER — IOPAMIDOL (ISOVUE-300) INJECTION 61%
30.0000 mL | Freq: Once | INTRAVENOUS | Status: AC
  Administered 2017-05-06: 30 mL via ORAL

## 2017-05-06 NOTE — ED Notes (Signed)
Pt spitting up small amounts of black/brown emesis, appears to look like it could be stomach acid.

## 2017-05-06 NOTE — ED Provider Notes (Signed)
-----------------------------------------   10:57 PM on 05/06/2017 -----------------------------------------  I took over care on this patient from Dr. Darnelle CatalanMalinda.  The patient had presentation consistent with bronchitis as well as generalized weakness and some vomiting.  Per RN, the vomitus may have been guaiac positive.  A CT was obtained which showed dilated small bowel loops, but with no evidence of obstruction and consistent with small bowel dysmotility.  Based on this reading, there is no indication for surgical consult or intervention.  On reassessment, the patient states that he continues to feel weak, somewhat nauseous, and short of breath compared to baseline.  He does not feel well to go home.  Per initial plan with Dr. Darnelle CatalanMalinda, I will proceed with admission.  I signed the patient out to the hospitalist Dr. Anne HahnWillis.   Dionne BucySiadecki, Kourosh Jablonsky, MD 05/06/17 2259

## 2017-05-06 NOTE — ED Provider Notes (Signed)
[facility] Emergency Department Provider Note   ____________________________________________   First MD Initiated Contact with Patient 05/06/17 [phone]     (approximate)  I have reviewed the triage vital signs and the nursing notes.   HISTORY  Chief Complaint Fatigue    HPI Kyle Wells is a 84 y.o. male Patient reports he is weak and feels nauseated and going on most of the day at least today. He usually uses 2 L of oxygen at home sometimes 3. He is having some shortness of breath and wheezing. He is not having any chest pain or tightness. No abdominal pain no diarrhea and no vomiting no fever.his symptoms it is making him feel uncomfortable.   Past Medical History:  Diagnosis Date  . Asthma   . BPH (benign prostatic hyperplasia)   . COPD (chronic obstructive pulmonary disease) ([facility])   . Hypercholesterolemia   . Impingement syndrome of right shoulder   . OSA (obstructive sleep apnea)   . Small bowel obstruction [facility](HCC)     Patient Active Problem List   Diagnosis Date Noted  . Small bowel obstruction ([facility]) 12/10/2016  . Healthcare maintenance 12/02/2016  . Nausea   . Obesity, unspecified 09/26/2016  . Obstructive sleep apnea on CPAP 11/02/2015  . Chronic obstructive pulmonary disease ([facility])   . Intestinal obstruction ([facility])   . Ileus ([facility]) 09/19/2014  . Impingement syndrome of shoulder, right 05/13/2014  . Benign localized hyperplasia of prostate with urinary obstruction and other lower urinary tract symptoms (LUTS)(600.21) 03/30/2014  . Hypercholesteremia 03/30/2014  . Sleep apnea 03/03/2012  . Benign localized prostatic hyperplasia with lower urinary tract symptoms (LUTS) 10/16/2011  . Elevated prostate specific antigen (PSA) 10/16/2011  . Enlarged prostate with lower urinary tract symptoms (LUTS) 10/16/2011  . Incomplete emptying of bladder 10/16/2011  . Frequency of micturition 10/16/2011  . Retention of urine 10/16/2011  . Asthma with  COPD ([facility]) 05/31/2011  . Chronic obstructive asthma ([facility]CC) 05/31/2011  . House dust mite allergy 10/20/2010  . Other allergy, other than to medicinal agents 10/20/2010    Past Surgical History:  Procedure Laterality Date  . PROSTATE BIOPSY      Prior to Admission medications   Medication Sig Start Date End Date Taking? Authorizing Provider  ADVAIR DISKUS 500-50 MCG/DOSE AEPB Inhale 2 puffs 2 (two) times daily into the lungs.  09/12/14   [provider]  albuterol (PROAIR HFA) 108 (90 Base) MCG/ACT inhaler Frequency:PHARMDIR   Dosage:90   MCG  Instructions:  Note:Dose: 90 MCG 10/16/11   [provider]  albuterol (PROVENTIL) (2.5 MG/3ML) 0.083% nebulizer solution Inhale 3 mLs into the lungs every 6 (six) hours. 02/21/14   [provider]  finasteride (PROSCAR) 5 MG tablet Take 1 tablet (5 mg total) by mouth daily. 09/21/14   Lattie Hawooper, Richard E, MD  LUTEIN PO Take by mouth. 11/11/08   [provider]  montelukast (SINGULAIR) 10 MG tablet Take 1 tablet by mouth daily. 09/12/14   [provider]  Multiple Vitamins-Minerals (MULTIVITAMIN & MINERAL PO) Take 1 tablet by mouth daily. 11/11/08   [provider]  Naproxen Sodium (ALEVE) 220 MG CAPS Take 1 capsule by mouth every 6 (six) hours as needed (headache, mild pain).    [provider]  ondansetron (ZOFRAN ODT) 4 MG disintegrating tablet Take 1 tablet (4 mg total) by mouth every 8 (eight) hours as needed for nausea or vomiting. 05/06/17   Arnaldo NatalMalinda, Nature Kueker F, MD  PNEUMOVAX 23 25 MCG/0.5ML injection  TO BE ADMINISTERED BY PHARMACIST FOR IMMUNIZATION 12/25/16   [provider]  PROAIR HFA 108 (90 BASE) MCG/ACT inhaler Inhale 2 puffs into the lungs 2 (two) times daily. 09/12/14   [provider]  ranitidine (ZANTAC) 150 MG tablet Take 150 mg by mouth daily.     [provider]  SPIRIVA HANDIHALER 18 MCG inhalation capsule Place 2 puffs into inhaler and inhale daily. 09/13/14    [provider]    Allergies Zileuton and Roflumilast  Family History  Problem Relation Age of Onset  . Asthma Father   . Heart attack Father     Social History Social History   Tobacco Use  . Smoking status: Current Some Day Smoker    Packs/day: 0.10    Types: Cigarettes  . Smokeless tobacco: Never Used  Substance Use Topics  . Alcohol use: No  . Drug use: No    Review of Systems  Constitutional: No fever/chills Eyes: No visual changes. ENT: No sore throat. Cardiovascular: Denies chest pain. Respiratory:thumb shortness of breath. Gastrointestinal: No abdominal pain.   nausea, no vomiting.  No diarrhea.  No constipation. Genitourinary: Negative for dysuria. Musculoskeletal: Negative for back pain. Skin: Negative for rash. Neurological: Negative for headaches, focal weakness  ____________________________________________   PHYSICAL EXAM:  VITAL SIGNS: ED Triage Vitals  Enc Vitals Group     BP 05/06/17 1632 109/61     Pulse Rate 05/06/17 1632 60     Resp 05/06/17 1632 18     Temp 05/06/17 1632 98.3 F (36.8 C)     Temp Source 05/06/17 1632 Oral     SpO2 05/06/17 1625 96 %     Weight 05/06/17 1633 212 lb (96.2 kg)     Height 05/06/17 1633 5\' 7"  (1.702 m)     Head Circumference --      Peak Flow --      Pain Score 05/06/17 1633 0     Pain Loc --      Pain Edu? --      Excl. in GC? --     Constitutional: Alert and oriented. Well appearing and in no acute distress. Eyes: Conjunctivae are normal.  Head: Atraumatic. Nose: No congestion/rhinnorhea. Mouth/Throat: Mucous membranes are moist.  Oropharynx non-erythematous. Neck: No stridor.   Cardiovascular: Normal rate, regular rhythm. Grossly normal heart sounds.  Good peripheral circulation. Respiratory: Normal respiratory effort.  No retractions. Lungs CTAB. Gastrointestinal: Soft and nontender. No distention. No abdominal bruits. No CVA tenderness. Musculoskeletal: No lower extremity tenderness  nor edema.  No joint effusions. Neurologic:  Normal speech and language. No gross focal neurologic deficits are appreciated. No gait instability. Skin:  Skin is warm, dry and intact. No rash noted.   ____________________________________________   LABS (all labs ordered are listed, but only abnormal results are displayed)  Labs Reviewed  COMPREHENSIVE METABOLIC PANEL - Abnormal; Notable for the following components:      Result Value   Glucose, Bld 142 (*)    BUN 24 (*)    All other components within normal limits  CBC WITH DIFFERENTIAL/PLATELET - Abnormal; Notable for the following components:   WBC 12.8 (*)    Neutro Abs 10.7 (*)    All other components within normal limits  URINALYSIS, COMPLETE (UACMP) WITH MICROSCOPIC - Abnormal; Notable for the following components:   Color, Urine YELLOW (*)    APPearance CLEAR (*)    Ketones, ur 5 (*)    Leukocytes, UA TRACE (*)    Bacteria,  UA RARE (*)    Squamous Epithelial / LPF 0-5 (*)    All other components within normal limits  TROPONIN I  BRAIN NATRIURETIC PEPTIDE  TROPONIN I  TSH   ____________________________________________  EKG  EKG read and interpreted by me shows normal sinus rhythm rate of 65 normal axis no acute ST-T wave changes ____________________________________________  RADIOLOGY  ED MD interpretation:chest x-ray reviewed by me shows no active disease as radiology noted  Official radiology report(s): Ct Chest Wo Contrast  Result Date: 05/06/2017 CLINICAL DATA:  Nausea, fatigue, shortness of breath. History of COPD. EXAM: CT CHEST WITHOUT CONTRAST TECHNIQUE: Multidetector CT imaging of the chest was performed following the standard protocol without IV contrast. COMPARISON:  Chest radiograph May 06, 2017 and CT chest August 02, 2013 FINDINGS: CARDIOVASCULAR: The heart is upper limits of normal in size. No pericardial effusions. Trace coronary artery calcifications. Thoracic aorta is normal course and caliber, trace  calcific atherosclerosis. MEDIASTINUM/NODES: No mediastinal mass. No lymphadenopathy by CT size criteria. LUNGS/PLEURA: Tracheobronchial tree is patent, no pneumothorax. Trace bronchial wall thickening. Trace secretions in the trachea and carina. RIGHT linear atelectasis/scarring. LEFT lower lobe atelectasis/scarred, improved aeration from prior CT. Similar lingular nodular scarring. No pleural effusion. UPPER ABDOMEN: Nonacute.  Small hiatal hernia. MUSCULOSKELETAL: Nonacute.  Mild degenerative change of the spine. IMPRESSION: 1. Trace secretions in the trachea. Trace bronchial wall thickening associated with bronchitis or reactive airway disease. 2. Borderline cardiomegaly. 3. Bibasilar atelectasis/scarring. Aortic Atherosclerosis (ICD10-I70.0). Electronically Signed   By: Awilda Metro M.D.   On: 05/06/2017 19:54   Dg Chest Portable 1 View  Result Date: 05/06/2017 CLINICAL DATA:  Weakness EXAM: PORTABLE CHEST 1 VIEW COMPARISON:  10/31/2016 FINDINGS: Borderline to mild cardiomegaly. No acute airspace disease or pleural effusion. No pneumothorax. IMPRESSION: No active disease.  Borderline to mild cardiomegaly. Electronically Signed   By: Jasmine Pang M.D.   On: 05/06/2017 16:36    ____________________________________________   PROCEDURES  Procedure(s) performed:   Procedures  Critical Care performed:  ____________________________________________   INITIAL IMPRESSION / ASSESSMENT AND PLAN / ED COURSE ----------------------------------------- 8:42 PM on 05/06/2017 -----------------------------------------  Patient is now began apparently vomiting up some small amounts of bloody material. His abdomen is still soft and nontender. Since he has the history of bowel obstruction in the past I will CT his abdomen with contrast and we will try to determine further what's going on with this gentleman. I will have to sign him out to Dr. Carolin Coy      Clinical Course as of May 06 2129  Tue  May 06, 2017  2014 Glucose: NEGATIVE [PM]    Clinical Course User Index [PM] Arnaldo Natal, MD     ____________________________________________   FINAL CLINICAL IMPRESSION(S) / ED DIAGNOSES further diagnosis per Dr Litzenberg [facility]   ED Discharge Orders        Ordered    ondansetron (ZOFRAN ODT) 4 MG disintegrating tablet  Every 8 hours PRN     05/06/17 2026       Note:  This document was prepared using Dragon voice recognition software and may include unintentional dictation errors.    Arnaldo Natal, MD 05/06/17 2131

## 2017-05-06 NOTE — ED Notes (Signed)
MD made aware of pt emesis. MD tested emesis for blood. Pt hemoccult test positive for blood.

## 2017-05-06 NOTE — ED Notes (Signed)
 4mg  zofran only given ONCE. Not sure why its showing up twice on the chart.

## 2017-05-06 NOTE — ED Notes (Signed)
Pt given 4mg  zofran IV at this time. CT in room at this time and going over contrasting agent. Zofran not scanned for administration.

## 2017-05-06 NOTE — ED Triage Notes (Signed)
Pt arrives via ems from home with reports of nausea, no vomiting, with generalized feeling of fatigue. Ems reports giving 4 mg zofran in route. Pt appears to be SOB, with expiratory wheezes bilateraly and diaphoretic temporarily during triage. o2 currenty 97 on 3L o2. Pt uses o2 at 3L as needed at home.

## 2017-05-07 DIAGNOSIS — K56600 Partial intestinal obstruction, unspecified as to cause: Secondary | ICD-10-CM | POA: Diagnosis present

## 2017-05-07 DIAGNOSIS — G4733 Obstructive sleep apnea (adult) (pediatric): Secondary | ICD-10-CM | POA: Diagnosis present

## 2017-05-07 DIAGNOSIS — J441 Chronic obstructive pulmonary disease with (acute) exacerbation: Secondary | ICD-10-CM | POA: Diagnosis present

## 2017-05-07 DIAGNOSIS — K56609 Unspecified intestinal obstruction, unspecified as to partial versus complete obstruction: Secondary | ICD-10-CM | POA: Diagnosis present

## 2017-05-07 DIAGNOSIS — Z9981 Dependence on supplemental oxygen: Secondary | ICD-10-CM | POA: Diagnosis not present

## 2017-05-07 DIAGNOSIS — F17210 Nicotine dependence, cigarettes, uncomplicated: Secondary | ICD-10-CM | POA: Diagnosis present

## 2017-05-07 DIAGNOSIS — K567 Ileus, unspecified: Secondary | ICD-10-CM | POA: Diagnosis present

## 2017-05-07 DIAGNOSIS — N400 Enlarged prostate without lower urinary tract symptoms: Secondary | ICD-10-CM | POA: Diagnosis present

## 2017-05-07 DIAGNOSIS — Z7951 Long term (current) use of inhaled steroids: Secondary | ICD-10-CM | POA: Diagnosis not present

## 2017-05-07 DIAGNOSIS — Z825 Family history of asthma and other chronic lower respiratory diseases: Secondary | ICD-10-CM | POA: Diagnosis not present

## 2017-05-07 DIAGNOSIS — Z8249 Family history of ischemic heart disease and other diseases of the circulatory system: Secondary | ICD-10-CM | POA: Diagnosis not present

## 2017-05-07 DIAGNOSIS — Z888 Allergy status to other drugs, medicaments and biological substances status: Secondary | ICD-10-CM | POA: Diagnosis not present

## 2017-05-07 DIAGNOSIS — J40 Bronchitis, not specified as acute or chronic: Secondary | ICD-10-CM | POA: Diagnosis present

## 2017-05-07 DIAGNOSIS — E7800 Pure hypercholesterolemia, unspecified: Secondary | ICD-10-CM | POA: Diagnosis present

## 2017-05-07 LAB — HEMOGLOBIN A1C
Hgb A1c MFr Bld: 5.6 % (ref 4.8–5.6)
Mean Plasma Glucose: 114.02 mg/dL

## 2017-05-07 MED ORDER — NAPROXEN SODIUM 275 MG PO TABS
275.0000 mg | ORAL_TABLET | Freq: Two times a day (BID) | ORAL | Status: DC | PRN
  Filled 2017-05-07: qty 1

## 2017-05-07 MED ORDER — TIOTROPIUM BROMIDE MONOHYDRATE 18 MCG IN CAPS
18.0000 ug | ORAL_CAPSULE | Freq: Every day | RESPIRATORY_TRACT | Status: DC
  Administered 2017-05-07 – 2017-05-08 (×2): 18 ug via RESPIRATORY_TRACT
  Filled 2017-05-07: qty 5

## 2017-05-07 MED ORDER — ONDANSETRON HCL 4 MG/2ML IJ SOLN
4.0000 mg | Freq: Four times a day (QID) | INTRAMUSCULAR | Status: DC | PRN
  Administered 2017-05-07 – 2017-05-08 (×2): 4 mg via INTRAVENOUS
  Filled 2017-05-07 (×2): qty 2

## 2017-05-07 MED ORDER — MOMETASONE FURO-FORMOTEROL FUM 200-5 MCG/ACT IN AERO
2.0000 | INHALATION_SPRAY | Freq: Two times a day (BID) | RESPIRATORY_TRACT | Status: DC
  Administered 2017-05-07 – 2017-05-08 (×3): 2 via RESPIRATORY_TRACT
  Filled 2017-05-07: qty 8.8

## 2017-05-07 MED ORDER — MONTELUKAST SODIUM 10 MG PO TABS
10.0000 mg | ORAL_TABLET | Freq: Every day | ORAL | Status: DC
  Administered 2017-05-07 – 2017-05-08 (×2): 10 mg via ORAL
  Filled 2017-05-07 (×2): qty 1

## 2017-05-07 MED ORDER — OCUVITE-LUTEIN PO CAPS
1.0000 | ORAL_CAPSULE | Freq: Every day | ORAL | Status: DC
  Administered 2017-05-08: 1 via ORAL
  Filled 2017-05-07 (×3): qty 1

## 2017-05-07 MED ORDER — ENOXAPARIN SODIUM 40 MG/0.4ML ~~LOC~~ SOLN
40.0000 mg | SUBCUTANEOUS | Status: DC
  Administered 2017-05-07 – 2017-05-09 (×3): 40 mg via SUBCUTANEOUS
  Filled 2017-05-07 (×3): qty 0.4

## 2017-05-07 MED ORDER — ALBUTEROL SULFATE (2.5 MG/3ML) 0.083% IN NEBU
3.0000 mL | INHALATION_SOLUTION | Freq: Three times a day (TID) | RESPIRATORY_TRACT | Status: DC
  Administered 2017-05-08 – 2017-05-09 (×4): 3 mL via RESPIRATORY_TRACT
  Filled 2017-05-07 (×4): qty 3

## 2017-05-07 MED ORDER — NAPROXEN SODIUM 220 MG PO TABS
220.0000 mg | ORAL_TABLET | Freq: Two times a day (BID) | ORAL | Status: DC | PRN
  Filled 2017-05-07: qty 1

## 2017-05-07 MED ORDER — ACETAMINOPHEN 650 MG RE SUPP: 650.0000 mg | Freq: Four times a day (QID) | RECTAL | Status: DC | PRN

## 2017-05-07 MED ORDER — SODIUM CHLORIDE 0.9 % IV SOLN
INTRAVENOUS | Status: DC
  Administered 2017-05-07 (×2): via INTRAVENOUS

## 2017-05-07 MED ORDER — ONDANSETRON HCL 4 MG PO TABS: 4.0000 mg | ORAL_TABLET | Freq: Four times a day (QID) | ORAL | Status: DC | PRN

## 2017-05-07 MED ORDER — FINASTERIDE 5 MG PO TABS
5.0000 mg | ORAL_TABLET | Freq: Every day | ORAL | Status: DC
  Administered 2017-05-07 – 2017-05-08 (×2): 5 mg via ORAL
  Filled 2017-05-07 (×2): qty 1

## 2017-05-07 MED ORDER — ACETAMINOPHEN 325 MG PO TABS: 650.0000 mg | ORAL_TABLET | Freq: Four times a day (QID) | ORAL | Status: DC | PRN

## 2017-05-07 MED ORDER — DOCUSATE SODIUM 100 MG PO CAPS
100.0000 mg | ORAL_CAPSULE | Freq: Two times a day (BID) | ORAL | Status: DC
  Administered 2017-05-07 – 2017-05-08 (×3): 100 mg via ORAL
  Filled 2017-05-07 (×3): qty 1

## 2017-05-07 MED ORDER — ALBUTEROL SULFATE (2.5 MG/3ML) 0.083% IN NEBU
3.0000 mL | INHALATION_SOLUTION | Freq: Four times a day (QID) | RESPIRATORY_TRACT | Status: DC
  Administered 2017-05-07 (×4): 3 mL via RESPIRATORY_TRACT
  Filled 2017-05-07 (×4): qty 3

## 2017-05-07 NOTE — H&P (Signed)
Kyle Wells is an 84 y.o. male.   Chief Complaint: Nausea HPI: The patient with past medical history of COPD, BPH and recurrent bowel obstruction presents emergency department complaining of nausea.  The patient has not had any vomiting but has been feeling generally unwell.  CT of the patient's abdomen revealed a transition point consistent with small bowel obstruction.  He began to have nonbilious emesis that was found to be guaiac positive which prompted the emergency department staff to call the hospitalist service for admission.  Past Medical History:  Diagnosis Date  . Asthma   . BPH (benign prostatic hyperplasia)   . COPD (chronic obstructive pulmonary disease) ([place])   . Hypercholesterolemia   . Impingement syndrome of right shoulder   . OSA (obstructive sleep apnea)   . Small bowel obstruction [facility])     Past Surgical History:  Procedure Laterality Date  . PROSTATE BIOPSY      Family History  Problem Relation Age of Onset  . Asthma Father   . Heart attack Father    Social History:  reports that he has been smoking cigarettes.  He has been smoking about 0.10 packs per day. He has never used smokeless tobacco. He reports that he does not drink alcohol or use drugs.  Allergies:  Allergies  Allergen Reactions  . Zileuton Hives    [Onset: 10/27/2009] [Onset: 10/27/2009] [Onset: 10/27/2009]  [Onset: 10/27/2009]  . Roflumilast Other (See Comments)    insomnia   [Onset: 11/29/2010] insomnia [Onset: 11/29/2010] Other reaction(s): Other (See Comments) insomnia [Onset: 11/29/2010]  insomnia [Onset: 11/29/2010]    Medications Prior to Admission  Medication Sig Dispense Refill  . ADVAIR DISKUS 500-50 MCG/DOSE AEPB Inhale 1 puff into the lungs 2 (two) times daily.     Marland Kitchen albuterol (PROVENTIL HFA;VENTOLIN HFA) 108 (90 Base) MCG/ACT inhaler Inhale 2 puffs into the lungs every 4 (four) hours as needed for wheezing or shortness of breath.    Marland Kitchen albuterol (PROVENTIL) (2.5  MG/3ML) 0.083% nebulizer solution Inhale 3 mLs into the lungs every 6 (six) hours.    . finasteride (PROSCAR) 5 MG tablet Take 1 tablet (5 mg total) by mouth daily. 30 tablet 1  . montelukast (SINGULAIR) 10 MG tablet Take 1 tablet by mouth at bedtime.     . multivitamin-lutein (OCUVITE-LUTEIN) CAPS capsule Take 1 capsule by mouth daily.    . naproxen sodium (ALEVE) 220 MG tablet Take 220 mg by mouth 2 (two) times daily as needed (pain).    . SPIRIVA HANDIHALER 18 MCG inhalation capsule Place 18 mcg into inhaler and inhale daily.       Results for orders placed or performed during the hospital encounter of 05/06/17 (from the past 48 hour(s))  Comprehensive metabolic panel     Status: Abnormal   Collection Time: 05/06/17  4:23 PM  Result Value Ref Range   Sodium 136 135 - 145 mmol/L   Potassium 3.7 3.5 - 5.1 mmol/L   Chloride 102 101 - 111 mmol/L   CO2 29 22 - 32 mmol/L   Glucose, Bld 142 (H) 65 - 99 mg/dL   BUN 24 (H) 6 - 20 mg/dL   Creatinine, Ser 0.91 0.61 - 1.24 mg/dL   Calcium 9.2 8.9 - 10.3 mg/dL   Total Protein 7.4 6.5 - 8.1 g/dL   Albumin 3.9 3.5 - 5.0 g/dL   AST 25 15 - 41 U/L   ALT 22 17 - 63 U/L   Alkaline Phosphatase 64 38 - 126 U/L  Total Bilirubin 1.0 0.3 - 1.2 mg/dL   GFR calc non Af Amer >60 >60 mL/min   GFR calc Af Amer >60 >60 mL/min    Comment: (NOTE) The eGFR has been calculated using the CKD EPI equation. This calculation has not been validated in all clinical situations. eGFR's persistently <60 mL/min signify possible Chronic Kidney Disease.    Anion gap 5 5 - 15    Comment: Performed at [facility], [address] [place], [place] [address]  CBC with Differential     Status: Abnormal   Collection Time: 05/06/17  4:23 PM  Result Value Ref Range   WBC 12.8 (H) 3.8 - 10.6 K/uL   RBC 5.11 4.40 - 5.90 MIL/uL   Hemoglobin 15.6 13.0 - 18.0 g/dL   HCT 47.5 40.0 - 52.0 %   MCV 93.0 80.0 - 100.0 fL   MCH 30.5 26.0 - 34.0 pg   MCHC 32.8 32.0 - 36.0  g/dL   RDW 13.7 11.5 - 14.5 %   Platelets 197 150 - 440 K/uL   Neutrophils Relative % 83 %   Neutro Abs 10.7 (H) 1.4 - 6.5 K/uL   Lymphocytes Relative 9 %   Lymphs Abs 1.1 1.0 - 3.6 K/uL   Monocytes Relative 8 %   Monocytes Absolute 1.0 0.2 - 1.0 K/uL   Eosinophils Relative 0 %   Eosinophils Absolute 0.1 0 - 0.7 K/uL   Basophils Relative 0 %   Basophils Absolute 0.0 0 - 0.1 K/uL    Comment: Performed at [facility], [address] [place], [place] [address]  Troponin I     Status: None   Collection Time: 05/06/17  4:23 PM  Result Value Ref Range   Troponin I <0.03 <0.03 ng/mL    Comment: Performed at [facility], [address] [place], [place] [address]  Urinalysis, Complete w Microscopic     Status: Abnormal   Collection Time: 05/06/17  4:24 PM  Result Value Ref Range   Color, Urine YELLOW (A) YELLOW   APPearance CLEAR (A) CLEAR   Specific Gravity, Urine 1.025 1.005 - 1.030   pH 6.0 5.0 - 8.0   Glucose, UA NEGATIVE NEGATIVE mg/dL   Hgb urine dipstick NEGATIVE NEGATIVE   Bilirubin Urine NEGATIVE NEGATIVE   Ketones, ur 5 (A) NEGATIVE mg/dL   Protein, ur NEGATIVE NEGATIVE mg/dL   Nitrite NEGATIVE NEGATIVE   Leukocytes, UA TRACE (A) NEGATIVE   RBC / HPF 0-5 0 - 5 RBC/hpf   WBC, UA 0-5 0 - 5 WBC/hpf   Bacteria, UA RARE (A) NONE SEEN   Squamous Epithelial / LPF 0-5 (A) NONE SEEN   Mucus PRESENT     Comment: Performed at [facility], [address] [place], [place] [address]  Brain natriuretic peptide     Status: None   Collection Time: 05/06/17  4:56 PM  Result Value Ref Range   B Natriuretic Peptide 22.0 0.0 - 100.0 pg/mL    Comment: Performed at [facility], [address] [place], [place] [address]  Troponin I     Status: None   Collection Time: 05/06/17  6:15 PM  Result Value Ref Range   Troponin I <0.03 <0.03 ng/mL    Comment: Performed at [facility], [address] [place], [place] [address]  TSH      Status: None   Collection Time: 05/06/17  7:21 PM  Result Value Ref Range   TSH 0.385 0.350 -  4.500 uIU/mL    Comment: Performed by a 3rd Generation assay with a functional sensitivity of <=0.01 uIU/mL. Performed at [facility], [address] [place],  [address]    Ct Chest Wo Contrast  Result Date: 05/06/2017 CLINICAL DATA:  Nausea, fatigue, shortness of breath. History of COPD. EXAM: CT CHEST WITHOUT CONTRAST TECHNIQUE: Multidetector CT imaging of the chest was performed following the standard protocol without IV contrast. COMPARISON:  Chest radiograph May 06, 2017 and CT chest August 02, 2013 FINDINGS: CARDIOVASCULAR: The heart is upper limits of normal in size. No pericardial effusions. Trace coronary artery calcifications. Thoracic aorta is normal course and caliber, trace calcific atherosclerosis. MEDIASTINUM/NODES: No mediastinal mass. No lymphadenopathy by CT size criteria. LUNGS/PLEURA: Tracheobronchial tree is patent, no pneumothorax. Trace bronchial wall thickening. Trace secretions in the trachea and carina. RIGHT linear atelectasis/scarring. LEFT lower lobe atelectasis/scarred, improved aeration from prior CT. Similar lingular nodular scarring. No pleural effusion. UPPER ABDOMEN: Nonacute.  Small hiatal hernia. MUSCULOSKELETAL: Nonacute.  Mild degenerative change of the spine. IMPRESSION: 1. Trace secretions in the trachea. Trace bronchial wall thickening associated with bronchitis or reactive airway disease. 2. Borderline cardiomegaly. 3. Bibasilar atelectasis/scarring. Aortic Atherosclerosis (ICD10-I70.0). Electronically Signed   By: Elon Alas M.D.   On: 05/06/2017 19:54   Ct Abdomen Pelvis W Contrast  Result Date: 05/06/2017 CLINICAL DATA:  84 year old male presents with nausea, fatigue and dyspnea. EXAM: CT ABDOMEN AND PELVIS WITH CONTRAST TECHNIQUE: Multidetector CT imaging of the abdomen and pelvis was performed using the standard protocol following bolus  administration of intravenous contrast. CONTRAST:  171m OMNIPAQUE IOHEXOL 300 MG/ML  SOLN COMPARISON:  12/10/2016 FINDINGS: Lower chest: Stable cardiomegaly without pericardial effusion. Subsegmental atelectasis in the lower lobes and lingula, slightly nodular in appearance on the very first image within the lingula measuring 9 mm. However on the scout view it appears to represent platelike atelectasis and unlikely a pulmonary nodule. No effusion. No pneumothorax. Small hiatal hernia noted. Hepatobiliary: Tiny dependent calculi within the gallbladder without secondary signs of acute cholecystitis. Homogeneous attenuation of the liver without mass. No biliary dilatation. Pancreas: Normal pancreas without ductal dilatation or mass. Spleen: Normal spleen without mass. Adrenals/Urinary Tract: Normal bilateral adrenal glands. Symmetric cortical enhancement of both kidneys without obstructive uropathy. Redemonstration of small interpolar and right upper pole renal cysts, the largest approximately 11 mm in the upper pole. No hydroureteronephrosis. Urinary bladder is unremarkable. Stomach/Bowel: Distended stomach with fluid and food. Normal small bowel rotation. Moderate air and fluid filled distention of mid jejunal loops to 3.8 cm in caliber with mild transmural thickening and slight mucosal enhancement of distal jejunal and ileal loops. No mechanical transition point. Normal appendix. Average amount of fecal residue within the colon. Descending and sigmoid diverticulosis without acute diverticulitis. Extensive circular muscle hypertrophy noted of the sigmoid colon secondary to diverticulosis. Vascular/Lymphatic: Mild aortoiliac atherosclerosis without aneurysm. No lymphadenopathy. Reproductive: Enlarged prostate measuring 6.6 x 5.9 x 4.9 cm impressing upon the base of the bladder. Other: Small fat containing umbilical hernia and fat containing left inguinal hernia. No free air nor free fluid. Musculoskeletal: Remote  moderate to severe L1 compression fracture of the superior endplate with Schmorl's node. Multilevel degenerative disc disease of the lumbar spine. Stable sclerotic focus consistent with a bone island of the acetabular roof on the left. IMPRESSION: 1. Dilated jejunal loops without mechanical obstruction. There is smooth tapering of small bowel to the terminal ileum suggesting small bowel dysmotility. 2. Stable cardiomegaly with atelectasis at each lung base.  3. Uncomplicated cholelithiasis. 4. Water attenuating cysts of the right kidney. No obstructive uropathy. 5. Prostatomegaly as before. 6. Chronic degenerative change of the lumbar spine with remote L1 superior endplate compression with Schmorl's node. Electronically Signed   By: Ashley Royalty M.D.   On: 05/06/2017 22:05   Dg Chest Portable 1 View  Result Date: 05/06/2017 CLINICAL DATA:  Weakness EXAM: PORTABLE CHEST 1 VIEW COMPARISON:  10/31/2016 FINDINGS: Borderline to mild cardiomegaly. No acute airspace disease or pleural effusion. No pneumothorax. IMPRESSION: No active disease.  Borderline to mild cardiomegaly. Electronically Signed   By: Donavan Foil M.D.   On: 05/06/2017 16:36    Review of Systems  Constitutional: Negative for chills and fever.  HENT: Negative for sore throat and tinnitus.   Eyes: Negative for blurred vision and redness.  Respiratory: Positive for shortness of breath. Negative for cough.   Cardiovascular: Negative for chest pain, palpitations, orthopnea and PND.  Gastrointestinal: Positive for abdominal pain, nausea and vomiting. Negative for diarrhea.  Genitourinary: Negative for dysuria, frequency and urgency.  Musculoskeletal: Negative for joint pain and myalgias.  Skin: Negative for rash.       No lesions  Neurological: Negative for speech change, focal weakness and weakness.  Endo/Heme/Allergies: Does not bruise/bleed easily.       No temperature intolerance  Psychiatric/Behavioral: Negative for depression and  suicidal ideas.    Blood pressure 130/71, pulse (!) 57, temperature 98.9 F (37.2 C), temperature source Oral, resp. rate 18, height '5\' 7"'  (1.702 m), weight 95.2 kg (209 lb 12.8 oz), SpO2 96 %. Physical Exam  Vitals reviewed. Constitutional: He is oriented to person, place, and time. He appears well-developed and well-nourished. No distress.  HENT:  Head: Normocephalic and atraumatic.  Mouth/Throat: Oropharynx is clear and moist.  Eyes: Pupils are equal, round, and reactive to light. Conjunctivae and EOM are normal. No scleral icterus.  Neck: Normal range of motion. Neck supple. No JVD present. No tracheal deviation present. No thyromegaly present.  Cardiovascular: Normal rate, regular rhythm and normal heart sounds. Exam reveals no gallop and no friction rub.  No murmur heard. Respiratory: Effort normal. No respiratory distress. He has wheezes.  GI: Soft. Bowel sounds are normal. He exhibits no distension. There is no tenderness.  Genitourinary:  Genitourinary Comments: Deferred  Musculoskeletal: Normal range of motion. He exhibits no edema.  Lymphadenopathy:    He has no cervical adenopathy.  Neurological: He is alert and oriented to person, place, and time. No cranial nerve deficit.  Skin: Skin is warm and dry. No rash noted. No erythema.  Psychiatric: He has a normal mood and affect. His behavior is normal. Judgment and thought content normal.     Assessment/Plan This is a 84 year old male admitted for small bowel obstruction. 1.  Small bowel obstruction: Bowel rest; gastric tube at the discretion of surgical service.  Patient will remain n.p.o. for now.  Hydrate with intravenous fluid. 2.  COPD: The patient does have some wheezing.  Breathing treatments as needed.  Continue inhaled corticosteroid as well as anticholinergic.   3.  BPH: Continue finasteride 4.  DVT prophylaxis: Lovenox 5.  GI prophylaxis: None The patient is a full code.  Time spent on admission orders and  patient care approximately 45 minutes  Harrie Foreman, MD 05/07/2017, 8:58 AM

## 2017-05-07 NOTE — Progress Notes (Signed)
Admitted for partial small bowel obstruction, feels much better, no abdominal pain or nausea.  Continue IV fluids,clear liquids,; patient feels better I do not think he needs any further workup.  Start clear liquids and see how he does 2.  COPD exacerbation, no wheezing now.  Continue bronchodilators, inhaled corticosteroids. 3.  BPH 4.  PT consult Discharge tomorrow.  Obtain abdominal x-ray again, continue stool softeners, patient said that he had a BM yesterday morning Time spent 20 minutes

## 2017-05-08 ENCOUNTER — Telehealth: Payer: Self-pay | Admitting: Surgery

## 2017-05-08 ENCOUNTER — Inpatient Hospital Stay: Payer: Medicare Other

## 2017-05-08 DIAGNOSIS — K56609 Unspecified intestinal obstruction, unspecified as to partial versus complete obstruction: Secondary | ICD-10-CM

## 2017-05-08 MED ORDER — POLYETHYLENE GLYCOL 3350 17 G PO PACK
17.0000 g | PACK | Freq: Every day | ORAL | Status: DC
  Administered 2017-05-08: 17 g via ORAL
  Filled 2017-05-08: qty 1

## 2017-05-08 NOTE — Telephone Encounter (Signed)
Called patient back and he stated that he was at the hospital again for a partial small bowel obstruction. He wanted Dr. Excell Seltzerooper to come down to see him. I told him that Dr. Excell Seltzerooper was covering the night shift tonight. However, I told him that he could ask for the general surgeon to come and see him if needed to. Patient stated that he would let the nurse know. I also told the patient that once he was discharged, that they would want to be followed by a surgeon. Patient did not have any further questions.

## 2017-05-08 NOTE — Consult Note (Signed)
Surgical Consultation  05/08/2017  Kyle Wells is an 84 y.o. male.   Referring Physician: Luberta MutterKonidena  CC:SBO  HPI: This patient well-known to me with multiple recurrent small bowel obstructions.  He is never had abdominal surgery before and the etiology of his bowel obstruction has never been delineated despite multiple endoscopies CAT scans and small bowel series.  We were asked to see the patient again for recurrent bowel obstruction.  He states that yesterday started having pain had a bowel movement yesterday was nauseated but did not vomit until last night came to the emergency room and did not require a nasogastric tube.  He is completely resolved at this point is passing gas and had a bowel movement has no nausea vomiting no abdominal pain and in fact he has been given a regular diet for dinner.  He thinks he is completely resolved again.  Also of note in the past I have suggested an option of laparoscopy with a low yield and offered to transfer [facility] and he has declined multiple times.  Past Medical History:  Diagnosis Date  . Asthma   . BPH (benign prostatic hyperplasia)   . COPD (chronic obstructive pulmonary disease) ([facility])   . Hypercholesterolemia   . Impingement syndrome of right shoulder   . OSA (obstructive sleep apnea)   . Small bowel obstruction [facility])     Past Surgical History:  Procedure Laterality Date  . PROSTATE BIOPSY      Family History  Problem Relation Age of Onset  . Asthma Father   . Heart attack Father     Social History:  reports that he has been smoking cigarettes.  He has been smoking about 0.10 packs per day. He has never used smokeless tobacco. He reports that he does not drink alcohol or use drugs.  Allergies:  Allergies  Allergen Reactions  . Zileuton Hives    [Onset: 10/27/2009] [Onset: 10/27/2009] [Onset: 10/27/2009]  [Onset: 10/27/2009]  . Roflumilast Other (See Comments)    insomnia   [Onset:  11/29/2010] insomnia [Onset: 11/29/2010] Other reaction(s): Other (See Comments) insomnia [Onset: 11/29/2010]  insomnia [Onset: 11/29/2010]    Medications reviewed.   Review of Systems:   Review of Systems  Constitutional: Negative.   HENT: Negative.   Eyes: Negative.   Respiratory: Negative.   Cardiovascular: Negative.   Gastrointestinal: Positive for abdominal pain and nausea. Negative for heartburn and vomiting.  Genitourinary: Negative.   Musculoskeletal: Negative.   Skin: Negative.   Neurological: Negative.   Endo/Heme/Allergies: Negative.   Psychiatric/Behavioral: Negative.      Physical Exam:  BP 106/86 (BP Location: Right Arm)   Pulse (!) 55   Temp 98 F (36.7 C) (Oral)   Resp 18   Ht 5\' 7"  (1.702 m)   Wt 216 lb 11.4 oz (98.3 kg)   SpO2 96%   BMI 33.94 kg/m   Physical Exam  HENT:  Head: Normocephalic and atraumatic.  Neck: Normal range of motion. Neck supple.  Pulmonary/Chest: Effort normal. No respiratory distress.  Abdominal: Soft. He exhibits distension. He exhibits no mass. There is no tenderness. There is no guarding.  Vitals reviewed.     Results for orders placed or performed during the hospital encounter of 05/06/17 (from the past 48 hour(s))  Hemoglobin A1c     Status: None   Collection Time: 05/07/17  5:17 AM  Result Value Ref Range   Hgb A1c MFr Bld 5.6 4.8 - 5.6 %  Comment: (NOTE) Pre diabetes:          5.7%-6.4% Diabetes:              >6.4% Glycemic control for   <7.0% adults with diabetes    Mean Plasma Glucose 114.02 mg/dL    Comment: Performed at [facility], [address] [place], [place] [address]   Dg Abd 1 View  Result Date: 05/08/2017 CLINICAL DATA:  Nausea, history COPD, recurrent bowel obstructions EXAM: ABDOMEN - 1 VIEW COMPARISON:  CT abdomen and pelvis 05/06/2017 FINDINGS: Persistent gaseous distension of proximal small bowel loops, slightly increased in number. Largest loop measures 5.6 cm  transverse. Small amount of gas within the ascending and proximal transverse colon with contrast from the CT exam in the distal transverse and descending colon. No gross bowel wall thickening. Subsegmental atelectasis LEFT base. Bones demineralized without acute osseous findings. IMPRESSION: Persistent dilatation of small bowel loops, slightly increased in number since the previous exam. Electronically Signed   By: Ulyses Southward M.D.   On: 05/08/2017 09:04   Ct Abdomen Pelvis W Contrast  Result Date: 05/06/2017 CLINICAL DATA:  84 year old male presents with nausea, fatigue and dyspnea. EXAM: CT ABDOMEN AND PELVIS WITH CONTRAST TECHNIQUE: Multidetector CT imaging of the abdomen and pelvis was performed using the standard protocol following bolus administration of intravenous contrast. CONTRAST:  OMNIPAQUE IOHEXOL 300 MG/ML  SOLN COMPARISON:  12/10/2016 FINDINGS: Lower chest: Stable cardiomegaly without pericardial effusion. Subsegmental atelectasis in the lower lobes and lingula, slightly nodular in appearance on the very first image within the lingula measuring 9 mm. However on the scout view it appears to represent platelike atelectasis and unlikely a pulmonary nodule. No effusion. No pneumothorax. Small hiatal hernia noted. Hepatobiliary: Tiny dependent calculi within the gallbladder without secondary signs of acute cholecystitis. Homogeneous attenuation of the liver without mass. No biliary dilatation. Pancreas: Normal pancreas without ductal dilatation or mass. Spleen: Normal spleen without mass. Adrenals/Urinary Tract: Normal bilateral adrenal glands. Symmetric cortical enhancement of both kidneys without obstructive uropathy. Redemonstration of small interpolar and right upper pole renal cysts, the largest approximately 11 mm in the upper pole. No hydroureteronephrosis. Urinary bladder is unremarkable. Stomach/Bowel: Distended stomach with fluid and food. Normal small bowel rotation. Moderate air and  fluid filled distention of mid jejunal loops to 3.8 cm in caliber with mild transmural thickening and slight mucosal enhancement of distal jejunal and ileal loops. No mechanical transition point. Normal appendix. Average amount of fecal residue within the colon. Descending and sigmoid diverticulosis without acute diverticulitis. Extensive circular muscle hypertrophy noted of the sigmoid colon secondary to diverticulosis. Vascular/Lymphatic: Mild aortoiliac atherosclerosis without aneurysm. No lymphadenopathy. Reproductive: Enlarged prostate measuring 6.6 x 5.9 x 4.9 cm impressing upon the base of the bladder. Other: Small fat containing umbilical hernia and fat containing left inguinal hernia. No free air nor free fluid. Musculoskeletal: Remote moderate to severe L1 compression fracture of the superior endplate with Schmorl's node. Multilevel degenerative disc disease of the lumbar spine. Stable sclerotic focus consistent with a bone island of the acetabular roof on the left. IMPRESSION: 1. Dilated jejunal loops without mechanical obstruction. There is smooth tapering of small bowel to the terminal ileum suggesting small bowel dysmotility. 2. Stable cardiomegaly with atelectasis at each lung base. 3. Uncomplicated cholelithiasis. 4. Water attenuating cysts of the right kidney. No obstructive uropathy. 5. Prostatomegaly as before. 6. Chronic degenerative change of the lumbar spine with remote L1 superior endplate compression with Schmorl's node. Electronically Signed  By: Tollie Eth M.D.   On: 05/06/2017 22:05    Assessment/Plan:  This a patient with multiple bowel obstructions.  He has had this happen multiple times in the past but is never had abdominal surgery and the etiology of it is unclear in spite of multiple studies in the past.  I offered laparoscopy to him in the past which she is declined.  These declined it on multiple occasions.  I also offered transfer or referral to a tertiary center which she  is declined as well.  Currently he has completely resolved.  He feels that he is back to his normal state and in fact is taking a regular diet at this point.  There are no surgical needs at this time will sign off he does not need to follow-up with me in the office as we have had multiple discussions and there are no further tests unless he would like to proceed with surgery at some point or be referred to a tertiary center.  Will sign off  Lattie Haw, MD, FACS

## 2017-05-08 NOTE — Telephone Encounter (Signed)
Patient is calling asking for one of the nurses to give him a call. Please call patient and advise.

## 2017-05-08 NOTE — Progress Notes (Signed)
[facility] - [place] at [facility]   PATIENT NAME: Kyle Wells    MR#:  [id number]  DATE OF BIRTH:  12-23-1933  SUBJECTIVE: Admitted for nausea, partial small bowel obstruction.  Patient admitted for similar problem 2 times last year.  Patient is on stool softeners, he came in because of nausea, x-ray showed partial small bowel obstruction, today x-ray showed dilated bowel loops not much change from yesterday.  Patient feels completely fine without any abdominal pain or nausea.  Patient had normal BM yesterday.  And he is passing gas.  He wants to eat regular food.  I spoke to patient's daughter.  We will consult surgery, patient has seen Dr. Excell Seltzer in the office.  Patient started back on his home dose stool softeners.  Patient lab data is otherwise largely normal.  CHIEF COMPLAINT:   Chief Complaint  Patient presents with  . Fatigue    REVIEW OF SYSTEMS:   ROS CONSTITUTIONAL: No fever, fatigue or weakness.  EYES: No blurred or double vision.  EARS, NOSE, AND THROAT: No tinnitus or ear pain.  RESPIRATORY: No cough, shortness of breath, wheezing or hemoptysis.  CARDIOVASCULAR: No chest pain, orthopnea, edema.  GASTROINTESTINAL: No nausea, vomiting, diarrhea or abdominal pain.  GENITOURINARY: No dysuria, hematuria.  ENDOCRINE: No polyuria, nocturia,  HEMATOLOGY: No anemia, easy bruising or bleeding SKIN: No rash or lesion. MUSCULOSKELETAL: No joint pain or arthritis.   NEUROLOGIC: No tingling, numbness, weakness.  PSYCHIATRY: No anxiety or depression.   DRUG ALLERGIES:   Allergies  Allergen Reactions  . Zileuton Hives    [Onset: 10/27/2009] [Onset: 10/27/2009] [Onset: 10/27/2009]  [Onset: 10/27/2009]  . Roflumilast Other (See Comments)    insomnia   [Onset: 11/29/2010] insomnia [Onset: 11/29/2010] Other reaction(s): Other (See Comments) insomnia [Onset: 11/29/2010]  insomnia [Onset: 11/29/2010]    VITALS:  Blood pressure 106/86, pulse  (!) 55, temperature 98 F (36.7 C), temperature source Oral, resp. rate 18, height 5\' 7"  (1.702 m), weight 98.3 kg (216 lb 11.4 oz), SpO2 96 %.  PHYSICAL EXAMINATION:  GENERAL:  84 y.o.-year-old patient lying in the bed with no acute distress.  EYES: Pupils equal, round, reactive to light and accommodation. No scleral icterus. Extraocular muscles intact.  HEENT: Head atraumatic, normocephalic. Oropharynx and nasopharynx clear.  NECK:  Supple, no jugular venous distention. No thyroid enlargement, no tenderness.  LUNGS: Normal breath sounds bilaterally, no wheezing, rales,rhonchi or crepitation. No use of accessory muscles of respiration.  CARDIOVASCULAR: S1, S2 normal. No murmurs, rubs, or gallops.  ABDOMEN: Soft, nontender, nondistended. Bowel sounds present. No organomegaly or mass.  EXTREMITIES: No pedal edema, cyanosis, or clubbing.  NEUROLOGIC: Cranial nerves II through XII are intact. Muscle strength 5/5 in all extremities. Sensation intact. Gait not checked.  PSYCHIATRIC: The patient is alert and oriented x 3.  SKIN: No obvious rash, lesion, or ulcer.    LABORATORY PANEL:   CBC Recent Labs  Lab 05/06/17 1623  WBC 12.8*  HGB 15.6  HCT 47.5  PLT 197   ------------------------------------------------------------------------------------------------------------------  Chemistries  Recent Labs  Lab 05/06/17 1623  NA 136  K 3.7  CL 102  CO2 29  GLUCOSE 142*  BUN 24*  CREATININE 0.91  CALCIUM 9.2  AST 25  ALT 22  ALKPHOS 64  BILITOT 1.0   ------------------------------------------------------------------------------------------------------------------  Cardiac Enzymes Recent Labs  Lab 05/06/17 1815  TROPONINI <0.03   ------------------------------------------------------------------------------------------------------------------  RADIOLOGY:  Dg Abd 1 View  Result Date: 05/08/2017 CLINICAL DATA:  Nausea, history COPD, recurrent  bowel obstructions EXAM:  ABDOMEN - 1 VIEW COMPARISON:  CT abdomen and pelvis 05/06/2017 FINDINGS: Persistent gaseous distension of proximal small bowel loops, slightly increased in number. Largest loop measures 5.6 cm transverse. Small amount of gas within the ascending and proximal transverse colon with contrast from the CT exam in the distal transverse and descending colon. No gross bowel wall thickening. Subsegmental atelectasis LEFT base. Bones demineralized without acute osseous findings. IMPRESSION: Persistent dilatation of small bowel loops, slightly increased in number since the previous exam. Electronically Signed   By: Ulyses Southward M.D.   On: 05/08/2017 09:04   Ct Chest Wo Contrast  Result Date: 05/06/2017 CLINICAL DATA:  Nausea, fatigue, shortness of breath. History of COPD. EXAM: CT CHEST WITHOUT CONTRAST TECHNIQUE: Multidetector CT imaging of the chest was performed following the standard protocol without IV contrast. COMPARISON:  Chest radiograph May 06, 2017 and CT chest August 02, 2013 FINDINGS: CARDIOVASCULAR: The heart is upper limits of normal in size. No pericardial effusions. Trace coronary artery calcifications. Thoracic aorta is normal course and caliber, trace calcific atherosclerosis. MEDIASTINUM/NODES: No mediastinal mass. No lymphadenopathy by CT size criteria. LUNGS/PLEURA: Tracheobronchial tree is patent, no pneumothorax. Trace bronchial wall thickening. Trace secretions in the trachea and carina. RIGHT linear atelectasis/scarring. LEFT lower lobe atelectasis/scarred, improved aeration from prior CT. Similar lingular nodular scarring. No pleural effusion. UPPER ABDOMEN: Nonacute.  Small hiatal hernia. MUSCULOSKELETAL: Nonacute.  Mild degenerative change of the spine. IMPRESSION: 1. Trace secretions in the trachea. Trace bronchial wall thickening associated with bronchitis or reactive airway disease. 2. Borderline cardiomegaly. 3. Bibasilar atelectasis/scarring. Aortic Atherosclerosis (ICD10-I70.0).  Electronically Signed   By: Awilda Metro M.D.   On: 05/06/2017 19:54   Ct Abdomen Pelvis W Contrast  Result Date: 05/06/2017 CLINICAL DATA:  84 year old male presents with nausea, fatigue and dyspnea. EXAM: CT ABDOMEN AND PELVIS WITH CONTRAST TECHNIQUE: Multidetector CT imaging of the abdomen and pelvis was performed using the standard protocol following bolus administration of intravenous contrast. CONTRAST:  OMNIPAQUE IOHEXOL 300 MG/ML  SOLN COMPARISON:  12/10/2016 FINDINGS: Lower chest: Stable cardiomegaly without pericardial effusion. Subsegmental atelectasis in the lower lobes and lingula, slightly nodular in appearance on the very first image within the lingula measuring 9 mm. However on the scout view it appears to represent platelike atelectasis and unlikely a pulmonary nodule. No effusion. No pneumothorax. Small hiatal hernia noted. Hepatobiliary: Tiny dependent calculi within the gallbladder without secondary signs of acute cholecystitis. Homogeneous attenuation of the liver without mass. No biliary dilatation. Pancreas: Normal pancreas without ductal dilatation or mass. Spleen: Normal spleen without mass. Adrenals/Urinary Tract: Normal bilateral adrenal glands. Symmetric cortical enhancement of both kidneys without obstructive uropathy. Redemonstration of small interpolar and right upper pole renal cysts, the largest approximately 11 mm in the upper pole. No hydroureteronephrosis. Urinary bladder is unremarkable. Stomach/Bowel: Distended stomach with fluid and food. Normal small bowel rotation. Moderate air and fluid filled distention of mid jejunal loops to 3.8 cm in caliber with mild transmural thickening and slight mucosal enhancement of distal jejunal and ileal loops. No mechanical transition point. Normal appendix. Average amount of fecal residue within the colon. Descending and sigmoid diverticulosis without acute diverticulitis. Extensive circular muscle hypertrophy noted of the  sigmoid colon secondary to diverticulosis. Vascular/Lymphatic: Mild aortoiliac atherosclerosis without aneurysm. No lymphadenopathy. Reproductive: Enlarged prostate measuring 6.6 x 5.9 x 4.9 cm impressing upon the base of the bladder. Other: Small fat containing umbilical hernia and fat containing left inguinal hernia. No free air nor free fluid.  Musculoskeletal: Remote moderate to severe L1 compression fracture of the superior endplate with Schmorl's node. Multilevel degenerative disc disease of the lumbar spine. Stable sclerotic focus consistent with a bone island of the acetabular roof on the left. IMPRESSION: 1. Dilated jejunal loops without mechanical obstruction. There is smooth tapering of small bowel to the terminal ileum suggesting small bowel dysmotility. 2. Stable cardiomegaly with atelectasis at each lung base. 3. Uncomplicated cholelithiasis. 4. Water attenuating cysts of the right kidney. No obstructive uropathy. 5. Prostatomegaly as before. 6. Chronic degenerative change of the lumbar spine with remote L1 superior endplate compression with Schmorl's node. Electronically Signed   By: Tollie Ethavid  Kwon M.D.   On: 05/06/2017 22:05    EKG:   Orders placed or performed during the hospital encounter of 05/06/17  . ED EKG  . ED EKG  . EKG 12-Lead  . EKG 12-Lead    ASSESSMENT AND PLAN:  Partial small bowel obstruction causing nausea: Patient has no abdominal pain.  Previously patient needed NG tube decompression but this time patient feels completely normal he does not feel bloated and also no abdominal pain.  Abdominal x-ray showing dilated bowel loops but clinically he has no abdominal pain and physical exam also is benign.  So I started on regular diet as per family request and see how he tolerates.  Surgery consult also was placed for follow-up of partial SBO. 2.  COPD exacerbation: Patient has no wheezing.  Patient is on bronchodilators, inhaled corticosteroids. 3.  Constipation, patient has  history of constipation continue stool softeners with MiraLAX, Colace. 4.  Likely discharge tomorrow home if stable. Discussed with patient's daughter. All the records are reviewed and case discussed with Care Management/Social Workerr. Management plans discussed with the patient, family and they are in agreement.  CODE STATUS: full  TOTAL TIME TAKING CARE OF THIS PATIENT: 35 minutes.   POSSIBLE D/C IN 1-2 DAYS, DEPENDING ON CLINICAL CONDITION.   Katha HammingSnehalatha Malita Ignasiak M.D on 05/08/2017 at 6:47 PM  Between 7am to 6pm - Pager - [phone]  After 6pm go to [email or web address] - password [email or web address] [facility]  [facility]  Office  [phone]  CC: Primary care physician; Lauro RegulusAnderson, Marshall W, MD   Note: This dictation was prepared with Dragon dictation along with smaller phrase technology. Any transcriptional errors that result from this process are unintentional.

## 2017-05-09 MED ORDER — DOCUSATE SODIUM 100 MG PO CAPS: 100.0000 mg | ORAL_CAPSULE | Freq: Two times a day (BID) | ORAL | 0 refills | Status: DC

## 2017-05-09 MED ORDER — POLYETHYLENE GLYCOL 3350 17 G PO PACK: 17.0000 g | PACK | Freq: Every day | ORAL | 0 refills | Status: DC

## 2017-05-09 NOTE — Progress Notes (Signed)
Patient is being discharged to home. Sister is here to pick up patient. DC & RX instructions given and family acknowledged understanding. Sister spoke with Dr. Luberta MutterKonidena on the phone prior to nurse discharging. NT took patient out to car for transportation.

## 2017-05-12 ENCOUNTER — Telehealth: Payer: Self-pay

## 2017-05-12 NOTE — Telephone Encounter (Signed)
Called patient to ask how he was feeling. He stated that he was feeling great after he left the hospital. Patient also stated that he had an appointment scheduled with his PCP and GI physicians. I asked him if he wanted to come in to see Dr. Excell Seltzerooper to discuss possible surgery. He stated that he did not need or wanted surgery, so per patient's request, I cancelled his appointment.

## 2017-05-14 ENCOUNTER — Inpatient Hospital Stay: Payer: Self-pay | Admitting: Surgery

## 2017-05-15 NOTE — Discharge Summary (Signed)
Kyle Wells, is a 84 y.o. male  DOB 10-16-1933  MRN [id number].  Admission date:  05/06/2017  Admitting Physician  Arnaldo Natal, MD  Discharge Date:  05/09/2017   Primary MD  Lauro Regulus, MD  Recommendations for primary care physician for things to follow:   Follow with PCP in 1 week   Admission Diagnosis  Ileus (HCC) [K56.7] Weakness [R53.1] Bronchitis [J40]   Discharge Diagnosis  Ileus (HCC) [K56.7] Weakness [R53.1] Bronchitis [J40]    Active Problems:   SBO (small bowel obstruction) (HCC)      Past Medical History:  Diagnosis Date  . Asthma   . BPH (benign prostatic hyperplasia)   . COPD (chronic obstructive pulmonary disease) (HCC)   . Hypercholesterolemia   . Impingement syndrome of right shoulder   . OSA (obstructive sleep apnea)   . Small bowel obstruction [facility])     Past Surgical History:  Procedure Laterality Date  . PROSTATE BIOPSY         History of present illness and  Hospital Course:     Kindly see H&P for history of present illness and admission details, please review complete Labs, Consult reports and Test reports for all details in brief  HPI  from the history and physical done on the day of admission 84 year old patient admitted for nausea, found to have partial small bowel obstruction, admitted to medical service.   Hospital Course  #1 recurrent partial small bowel obstruction causing nausea, patient did not have abdominal pain patient has been admitted for same problem before, follows up with Dr. Excell Seltzer as an outpatient.  Previously he required NG tube decompression but this admission patient received IV fluids, conservative management with nausea medicine.  He never required NG tube for decompression or pain medicine.  Initially he was given fluids x-ray started on  liquid diet and then advance to regular diet.  Seen by Dr. Excell Seltzer from surgery.  Advised patient and family to continue his MiraLAX, Colace.  Patient to avoid constipation.  Dr. Excell Seltzer can follow this patient as an outpatient .  Patient sister, daughter had a lot of questions in the hospital and answered all the questions, explained all the lab reports.  Patient did not have acute pancreatitis. 2.  COPD exacerbation, no wheezing.  Received inhaled steroids, bronchodilators.      Discharge Condition; stable   Follow UP  Follow-up Information    Whitaker, Barbara Cower Hestle, PA-C. Schedule an appointment as soon as possible for a visit on 05/15/2017.   Specialty:  Family Medicine Why:  AT 1030 Contact information: [address] [place] [address] [phone]        Lattie Haw, MD. Schedule an appointment as soon as possible for a visit on 05/14/2017.   Specialty:  Surgery Why:  As neede @4PM  Contact information: [address] [place] [address] [phone]             Discharge Instructions  and  Discharge Medications     Allergies as of 05/09/2017      Reactions   Zileuton Hives   [Onset: 10/27/2009] [Onset: 10/27/2009] [Onset: 10/27/2009] [Onset: 10/27/2009]   Roflumilast Other (See Comments)   insomnia   [Onset: 11/29/2010] insomnia [Onset: 11/29/2010] Other reaction(s): Other (See Comments) insomnia [Onset: 11/29/2010] insomnia [Onset: 11/29/2010]      Medication List    STOP taking these medications   naproxen sodium 220 MG tablet Commonly known as:  ALEVE  TAKE these medications   ADVAIR DISKUS 500-50 MCG/DOSE Aepb Generic drug:  Fluticasone-Salmeterol Inhale 1 puff into the lungs 2 (two) times daily.   albuterol 108 (90 Base) MCG/ACT inhaler Commonly known as:  PROVENTIL HFA;VENTOLIN HFA Inhale 2 puffs into the lungs every 4 (four) hours as needed for wheezing or shortness of breath.   albuterol (2.5 MG/3ML)  0.083% nebulizer solution Commonly known as:  PROVENTIL Inhale 3 mLs into the lungs every 6 (six) hours.   docusate sodium 100 MG capsule Commonly known as:  COLACE Take 1 capsule (100 mg total) by mouth 2 (two) times daily.   finasteride 5 MG tablet Commonly known as:  PROSCAR Take 1 tablet (5 mg total) by mouth daily.   montelukast 10 MG tablet Commonly known as:  SINGULAIR Take 1 tablet by mouth at bedtime.   multivitamin-lutein Caps capsule Take 1 capsule by mouth daily.   ondansetron 4 MG disintegrating tablet Commonly known as:  ZOFRAN ODT Take 1 tablet (4 mg total) by mouth every 8 (eight) hours as needed for nausea or vomiting.   polyethylene glycol packet Commonly known as:  MIRALAX / GLYCOLAX Take 17 g by mouth daily.   SPIRIVA HANDIHALER 18 MCG inhalation capsule Generic drug:  tiotropium Place 18 mcg into inhaler and inhale daily.         Diet and Activity recommendation: See Discharge Instructions above   Consults obtained - surgery   Major procedures and Radiology Reports - PLEASE review detailed and final reports for all details, in brief -      Dg Abd 1 View  Result Date: 05/08/2017 CLINICAL DATA:  Nausea, history COPD, recurrent bowel obstructions EXAM: ABDOMEN - 1 VIEW COMPARISON:  CT abdomen and pelvis 05/06/2017 FINDINGS: Persistent gaseous distension of proximal small bowel loops, slightly increased in number. Largest loop measures 5.6 cm transverse. Small amount of gas within the ascending and proximal transverse colon with contrast from the CT exam in the distal transverse and descending colon. No gross bowel wall thickening. Subsegmental atelectasis LEFT base. Bones demineralized without acute osseous findings. IMPRESSION: Persistent dilatation of small bowel loops, slightly increased in number since the previous exam. Electronically Signed   By: Ulyses Southward M.D.   On: 05/08/2017 09:04   Ct Chest Wo Contrast  Result Date: 05/06/2017 CLINICAL  DATA:  Nausea, fatigue, shortness of breath. History of COPD. EXAM: CT CHEST WITHOUT CONTRAST TECHNIQUE: Multidetector CT imaging of the chest was performed following the standard protocol without IV contrast. COMPARISON:  Chest radiograph May 06, 2017 and CT chest August 02, 2013 FINDINGS: CARDIOVASCULAR: The heart is upper limits of normal in size. No pericardial effusions. Trace coronary artery calcifications. Thoracic aorta is normal course and caliber, trace calcific atherosclerosis. MEDIASTINUM/NODES: No mediastinal mass. No lymphadenopathy by CT size criteria. LUNGS/PLEURA: Tracheobronchial tree is patent, no pneumothorax. Trace bronchial wall thickening. Trace secretions in the trachea and carina. RIGHT linear atelectasis/scarring. LEFT lower lobe atelectasis/scarred, improved aeration from prior CT. Similar lingular nodular scarring. No pleural effusion. UPPER ABDOMEN: Nonacute.  Small hiatal hernia. MUSCULOSKELETAL: Nonacute.  Mild degenerative change of the spine. IMPRESSION: 1. Trace secretions in the trachea. Trace bronchial wall thickening associated with bronchitis or reactive airway disease. 2. Borderline cardiomegaly. 3. Bibasilar atelectasis/scarring. Aortic Atherosclerosis (ICD10-I70.0). Electronically Signed   By: Awilda Metro M.D.   On: 05/06/2017 19:54   Ct Abdomen Pelvis W Contrast  Result Date: 05/06/2017 CLINICAL DATA:  84 year old male presents with nausea, fatigue and dyspnea. EXAM: CT ABDOMEN AND  PELVIS WITH CONTRAST TECHNIQUE: Multidetector CT imaging of the abdomen and pelvis was performed using the standard protocol following bolus administration of intravenous contrast. CONTRAST:  OMNIPAQUE IOHEXOL 300 MG/ML  SOLN COMPARISON:  12/10/2016 FINDINGS: Lower chest: Stable cardiomegaly without pericardial effusion. Subsegmental atelectasis in the lower lobes and lingula, slightly nodular in appearance on the very first image within the lingula measuring 9 mm. However on the  scout view it appears to represent platelike atelectasis and unlikely a pulmonary nodule. No effusion. No pneumothorax. Small hiatal hernia noted. Hepatobiliary: Tiny dependent calculi within the gallbladder without secondary signs of acute cholecystitis. Homogeneous attenuation of the liver without mass. No biliary dilatation. Pancreas: Normal pancreas without ductal dilatation or mass. Spleen: Normal spleen without mass. Adrenals/Urinary Tract: Normal bilateral adrenal glands. Symmetric cortical enhancement of both kidneys without obstructive uropathy. Redemonstration of small interpolar and right upper pole renal cysts, the largest approximately 11 mm in the upper pole. No hydroureteronephrosis. Urinary bladder is unremarkable. Stomach/Bowel: Distended stomach with fluid and food. Normal small bowel rotation. Moderate air and fluid filled distention of mid jejunal loops to 3.8 cm in caliber with mild transmural thickening and slight mucosal enhancement of distal jejunal and ileal loops. No mechanical transition point. Normal appendix. Average amount of fecal residue within the colon. Descending and sigmoid diverticulosis without acute diverticulitis. Extensive circular muscle hypertrophy noted of the sigmoid colon secondary to diverticulosis. Vascular/Lymphatic: Mild aortoiliac atherosclerosis without aneurysm. No lymphadenopathy. Reproductive: Enlarged prostate measuring 6.6 x 5.9 x 4.9 cm impressing upon the base of the bladder. Other: Small fat containing umbilical hernia and fat containing left inguinal hernia. No free air nor free fluid. Musculoskeletal: Remote moderate to severe L1 compression fracture of the superior endplate with Schmorl's node. Multilevel degenerative disc disease of the lumbar spine. Stable sclerotic focus consistent with a bone island of the acetabular roof on the left. IMPRESSION: 1. Dilated jejunal loops without mechanical obstruction. There is smooth tapering of small bowel to the  terminal ileum suggesting small bowel dysmotility. 2. Stable cardiomegaly with atelectasis at each lung base. 3. Uncomplicated cholelithiasis. 4. Water attenuating cysts of the right kidney. No obstructive uropathy. 5. Prostatomegaly as before. 6. Chronic degenerative change of the lumbar spine with remote L1 superior endplate compression with Schmorl's node. Electronically Signed   By: Tollie Eth M.D.   On: 05/06/2017 22:05   Dg Chest Portable 1 View  Result Date: 05/06/2017 CLINICAL DATA:  Weakness EXAM: PORTABLE CHEST 1 VIEW COMPARISON:  10/31/2016 FINDINGS: Borderline to mild cardiomegaly. No acute airspace disease or pleural effusion. No pneumothorax. IMPRESSION: No active disease.  Borderline to mild cardiomegaly. Electronically Signed   By: Jasmine Pang M.D.   On: 05/06/2017 16:36    Micro Results    No results found for this or any previous visit (from the past 240 hour(s)).     Today   Subjective:   Kyle Wells today has no headache,no chest abdominal pain,no new weakness tingling or numbness, feels much better wants to go home today.   Objective:   Blood pressure 115/81, pulse 71, temperature 98.3 F (36.8 C), temperature source Axillary, resp. rate 19, height 5\' 7"  (1.702 m), weight 95 kg (209 lb 6.4 oz), SpO2 98 %.  No intake or output data in the 24 hours ending 05/15/17 1248  Exam Awake Alert, Oriented x 3, No new F.N deficits, Normal affect [place].AT,PERRAL Supple Neck,No JVD, No cervical lymphadenopathy appriciated.  Symmetrical Chest wall movement, Good air movement bilaterally, CTAB RRR,No Gallops,Rubs  or new Murmurs, No Parasternal Heave +ve B.Sounds, Abd Soft, Non tender, No organomegaly appriciated, No rebound -guarding or rigidity. No Cyanosis, Clubbing or edema, No new Rash or bruise  Data Review   CBC w Diff:  Lab Results  Component Value Date   WBC 12.8 (H) 05/06/2017   HGB 15.6 05/06/2017   HGB 15.3 08/02/2013   HCT 47.5 05/06/2017   HCT 47.8  08/02/2013   PLT 197 05/06/2017   PLT 213 08/02/2013   LYMPHOPCT 9 05/06/2017   LYMPHOPCT 4.6 07/31/2013   MONOPCT 8 05/06/2017   MONOPCT 4.2 07/31/2013   EOSPCT 0 05/06/2017   EOSPCT 0.0 07/31/2013   BASOPCT 0 05/06/2017   BASOPCT 0.0 07/31/2013    CMP:  Lab Results  Component Value Date   NA 136 05/06/2017   NA 135 (L) 08/02/2013   K 3.7 05/06/2017   K 4.0 08/02/2013   CL 102 05/06/2017   CL 100 08/02/2013   CO2 29 05/06/2017   CO2 27 08/02/2013   BUN 24 (H) 05/06/2017   BUN 19 (H) 08/02/2013   CREATININE 0.91 05/06/2017   CREATININE 1.08 08/02/2013   PROT 7.4 05/06/2017   PROT 7.1 08/02/2013   ALBUMIN 3.9 05/06/2017   ALBUMIN 3.7 08/02/2013   BILITOT 1.0 05/06/2017   BILITOT 0.6 08/02/2013   ALKPHOS 64 05/06/2017   ALKPHOS 58 08/02/2013   AST 25 05/06/2017   AST 32 08/02/2013   ALT 22 05/06/2017   ALT 39 08/02/2013  .   Total Time in preparing paper work, data evaluation and todays exam - 35 minutes  Katha HammingSnehalatha Damont Balles M.D on 05/09/2017 at 12:48 PM    Note: This dictation was prepared with Dragon dictation along with smaller phrase technology. Any transcriptional errors that result from this process are unintentional.

## 2017-05-16 IMAGING — CR DG ABDOMEN 1V
1 series · 1 of 1 positions shown · non-contrast
Comparison: 01/26/2016

CLINICAL DATA: NG tube placement

EXAM:
ABDOMEN - 1 VIEW

[dg abd 1 view]
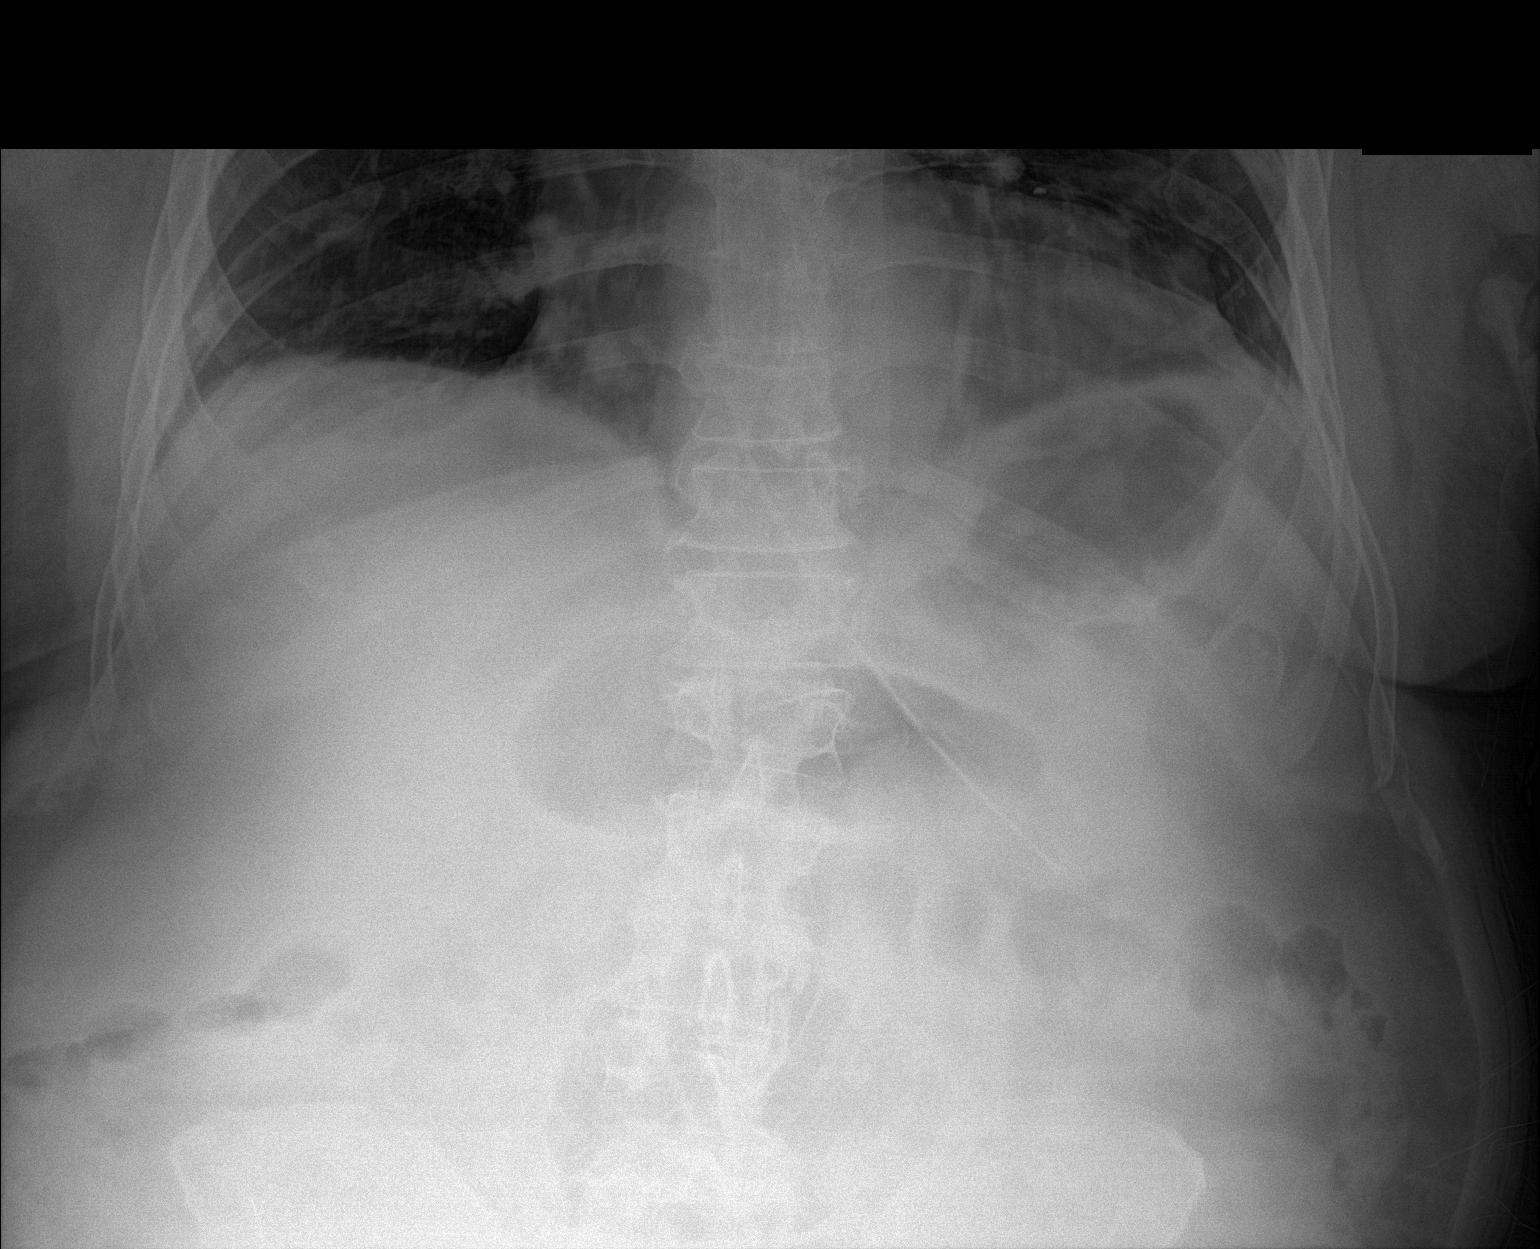

[1 of 1 positions shown; findings below may reference images not displayed]

FINDINGS: NG tube is in place with the tip in the proximal to mid stomach.
IMPRESSION: NG tube tip in the proximal to mid stomach.

## 2017-05-16 IMAGING — DX DG CHEST 1V PORT
1 series · 1 of 1 positions shown · non-contrast
Comparison: 08/29/2015

CLINICAL DATA: Nausea, abdominal distention and dyspnea

EXAM:
PORTABLE CHEST 1 VIEW

[chest ap]
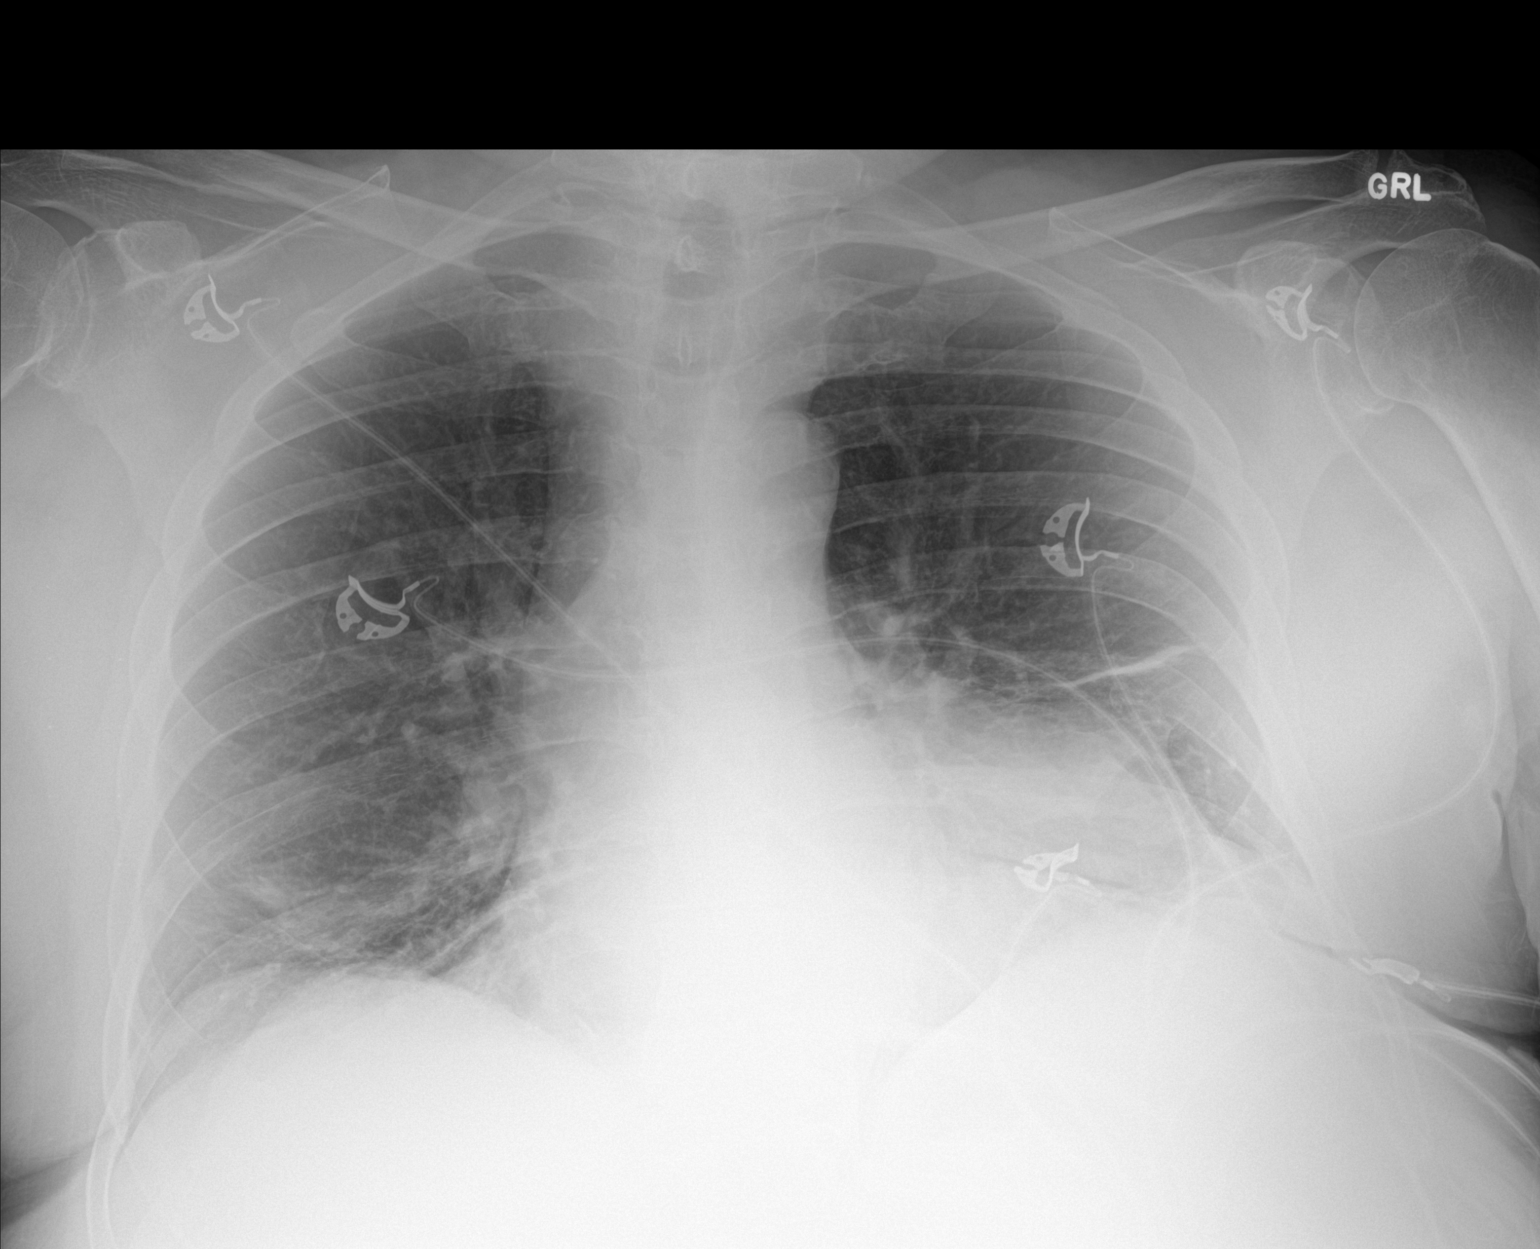

[1 of 1 positions shown; findings below may reference images not displayed]

FINDINGS: The cardiac silhouette is borderline enlarged. The aorta is not
aneurysmal. Platelike atelectasis in the left mid lung with
subsegmental atelectasis in the right lung base. No acute osseous
abnormality. No effusion or pneumothorax. No overt pulmonary edema.
IMPRESSION: Atelectasis within both lungs without evidence active
cardiopulmonary disease.

## 2017-11-03 DIAGNOSIS — M170 Bilateral primary osteoarthritis of knee: Secondary | ICD-10-CM | POA: Insufficient documentation

## 2017-12-27 NOTE — Discharge Instructions (Signed)
°  Instructions after Total Knee Replacement ° ° Kyle Wells, Jr., M.D.    ° Dept. of Orthopaedics & Sports Medicine ° [facility] ° [address] ° [place], [place]  [address] ° Phone: [phone]   Fax: [phone] ° °  °DIET: °• Drink plenty of non-alcoholic fluids. °• Resume your normal diet. Include foods high in fiber. ° °ACTIVITY:  °• You may use crutches or a walker with weight-bearing as tolerated, unless instructed otherwise. °• You may be weaned off of the walker or crutches by your Physical Therapist.  °• Do NOT place pillows under the knee. Anything placed under the knee could limit your ability to straighten the knee.   °• Continue doing gentle exercises. Exercising will reduce the pain and swelling, increase motion, and prevent muscle weakness.   °• Please continue to use the TED compression stockings for 6 weeks. You may remove the stockings at night, but should reapply them in the morning. °• Do not drive or operate any equipment until instructed. ° °WOUND CARE:  °• Continue to use the PolarCare or ice packs periodically to reduce pain and swelling. °• You may bathe or shower after the staples are removed at the first office visit following surgery. ° °MEDICATIONS: °• You may resume your regular medications. °• Please take the pain medication as prescribed on the medication. °• Do not take pain medication on an empty stomach. °• You have been given a prescription for a blood thinner (Lovenox or Coumadin). Please take the medication as instructed. (NOTE: After completing a 2 week course of Lovenox, take one Enteric-coated aspirin once a day. This along with elevation will help reduce the possibility of phlebitis in your operated leg.) °• Do not drive or drink alcoholic beverages when taking pain medications. ° °CALL THE OFFICE FOR: °• Temperature above 101 degrees °• Excessive bleeding or drainage on the dressing. °• Excessive swelling, coldness, or paleness of the toes. °• Persistent  nausea and vomiting. ° °FOLLOW-UP:  °• You should have an appointment to return to the office in 10-14 days after surgery. °• Arrangements have been made for continuation of Physical Therapy (either home therapy or outpatient therapy). °  °

## 2018-01-14 ENCOUNTER — Inpatient Hospital Stay: Admission: RE | Admit: 2018-01-14 | Payer: Medicare Other | Source: Ambulatory Visit

## 2018-01-23 ENCOUNTER — Encounter
Admission: RE | Admit: 2018-01-23 | Discharge: 2018-01-23 | Disposition: A | Discharge status: Home / Self Care | Payer: Medicare Other | Source: Ambulatory Visit | Attending: Orthopedic Surgery | Admitting: Orthopedic Surgery

## 2018-01-23 ENCOUNTER — Other Ambulatory Visit: Payer: Self-pay

## 2018-01-23 DIAGNOSIS — Z01818 Encounter for other preprocedural examination: Secondary | ICD-10-CM | POA: Diagnosis present

## 2018-01-23 LAB — SEDIMENTATION RATE: Sed Rate: 6 mm/hr (ref 0–20)

## 2018-01-23 LAB — APTT: aPTT: 34 seconds (ref 24–36)

## 2018-01-23 LAB — TYPE AND SCREEN
ABO/RH(D): O POS
Antibody Screen: NEGATIVE

## 2018-01-23 LAB — URINALYSIS, ROUTINE W REFLEX MICROSCOPIC
Bilirubin Urine: NEGATIVE
Glucose, UA: NEGATIVE mg/dL
Hgb urine dipstick: NEGATIVE
Ketones, ur: NEGATIVE mg/dL
Leukocytes, UA: NEGATIVE
Nitrite: NEGATIVE
Protein, ur: NEGATIVE mg/dL
Specific Gravity, Urine: 1.018 (ref 1.005–1.030)
pH: 5 (ref 5.0–8.0)

## 2018-01-23 LAB — COMPREHENSIVE METABOLIC PANEL
ALBUMIN: 4.2 g/dL (ref 3.5–5.0)
ALT: 31 U/L (ref 0–44)
AST: 37 U/L (ref 15–41)
Alkaline Phosphatase: 69 U/L (ref 38–126)
Anion gap: 10 (ref 5–15)
BUN: 20 mg/dL (ref 8–23)
CHLORIDE: 106 mmol/L (ref 98–111)
CO2: 23 mmol/L (ref 22–32)
Calcium: 9.3 mg/dL (ref 8.9–10.3)
Creatinine, Ser: 0.85 mg/dL (ref 0.61–1.24)
GFR calc Af Amer: >60 mL/min (ref >60)
GFR calc non Af Amer: >60 mL/min (ref >60)
GLUCOSE: 117 mg/dL — AB (ref 70–99)
Potassium: 3.7 mmol/L (ref 3.5–5.1)
Sodium: 139 mmol/L (ref 135–145)
Total Bilirubin: 0.7 mg/dL (ref 0.3–1.2)
Total Protein: 7.3 g/dL (ref 6.5–8.1)

## 2018-01-23 LAB — CBC
HCT: 47 % (ref 39.0–52.0)
Hemoglobin: 15.7 g/dL (ref 13.0–17.0)
MCH: 31.5 pg (ref 26.0–34.0)
MCHC: 33.4 g/dL (ref 30.0–36.0)
MCV: 94.2 fL (ref 80.0–100.0)
Platelets: 218 10*3/uL (ref 150–400)
RBC: 4.99 MIL/uL (ref 4.22–5.81)
RDW: 12.8 % (ref 11.5–15.5)
WBC: 6.5 10*3/uL (ref 4.0–10.5)
nRBC: 0 % (ref 0.0–0.2)

## 2018-01-23 LAB — PROTIME-INR
INR: 0.97
Prothrombin Time: 12.8 seconds (ref 11.4–15.2)

## 2018-01-23 LAB — SURGICAL PCR SCREEN
MRSA, PCR: NEGATIVE
Staphylococcus aureus: NEGATIVE

## 2018-01-23 LAB — C-REACTIVE PROTEIN: CRP: 1.1 mg/dL — ABNORMAL HIGH (ref <1.0)

## 2018-01-23 NOTE — Patient Instructions (Signed)
Your procedure is scheduled on: Wednesday 02/04/18 Report to DAY SURGERY DEPARTMENT LOCATED ON 2ND FLOOR MEDICAL MALL ENTRANCE. To find out your arrival time please call [phone] between 1PM - 3PM on Tuesday 02/03/18.  Remember: Instructions that are not followed completely may result in serious medical risk, up to and including death, or upon the discretion of your surgeon and anesthesiologist your surgery may need to be rescheduled.     _X__ 1. Do not eat food after midnight the night before your procedure.                 No gum chewing or hard candies. You may drink clear liquids up to 2 hours                 before you are scheduled to arrive for your surgery- DO not drink clear                 liquids within 2 hours of the start of your surgery.                 Clear Liquids include:  water, apple juice without pulp, clear carbohydrate                 drink such as Clearfast or Gatorade, Black Coffee or Tea (Do not add                 anything to coffee or tea).  __X__2.  On the morning of surgery brush your teeth with toothpaste and water, you  may rinse your mouth with mouthwash if you wish.  Do not swallow any   toothpaste of mouthwash.     _X__ 3.  No Alcohol for 24 hours before or after surgery.   _X__ 4.  Do Not Smoke or use e-cigarettes For 24 Hours Prior to Your Surgery.                 Do not use any chewable tobacco products for at least 6 hours prior to                 surgery.  ____  5.  Bring all medications with you on the day of surgery if instructed.   __X__  6.  Notify your doctor if there is any change in your medical condition      (cold, fever, infections).     Do not wear jewelry, make-up, hairpins, clips or nail polish. Do not wear lotions, powders, or perfumes.  Do not shave 48 hours prior to surgery. Men may shave face and neck. Do not bring valuables to the hospital.    [facility] for any belongings or valuables.  Contacts,  dentures/partials or body piercings may not be worn into surgery. Bring a case for your contacts, glasses or hearing aids, a denture cup will be supplied. Leave your suitcase in the car. After surgery it may be brought to your room. For patients admitted to the hospital, discharge time is determined by your treatment team.   Patients discharged the day of surgery will not be allowed to drive home.   Please read over the following fact sheets that you were given:   MRSA Information  __X__ Take these medicines the morning of surgery with A SIP OF WATER:    1. finasteride (PROSCAR)  2.   3.   4.  5.  6.  ____ Fleet Enema (as directed)   __X__ Use CHG  Soap NIGHT BEFORE AND MORNING OF SURGERY  __X__ Use inhalers on the day of surgery AND NEBULIZER  ____ Stop metformin/Janumet/Farxiga 2 days prior to surgery    ____ Take 1/2 of usual insulin dose the night before surgery. No insulin the morning          of surgery.   ____ Stop Blood Thinners Coumadin/Plavix/Xarelto/Pleta/Pradaxa/Eliquis/Effient/Aspirin  on   Or contact your Surgeon, Cardiologist or Medical Doctor regarding  ability to stop your blood thinners  __X__ Stop Anti-inflammatories 7 days before surgery such as Advil, Ibuprofen, Motrin,  BC or Goodies Powder, Naprosyn, Naproxen, Aleve, Aspirin OK TO TAKE TYLENOL OR ACETAMINOPHEN   __X__ Stop all herbal supplements, fish oil or vitamin E until after surgery.    __X__ Bring C-Pap to the hospital.   PRACTICE WITH THE INCENTIVE SPIROMETER  Bring Health care power of Attorney and Living Will

## 2018-01-24 LAB — URINE CULTURE: CULTURE: NO GROWTH

## 2018-02-04 ENCOUNTER — Inpatient Hospital Stay: Admission: RE | Admit: 2018-02-04 | Payer: Medicare Other | Source: Home / Self Care | Admitting: Orthopedic Surgery

## 2018-02-04 ENCOUNTER — Encounter: Admission: RE | Payer: Self-pay | Source: Home / Self Care

## 2018-02-04 SURGERY — ARTHROPLASTY, KNEE, TOTAL, USING IMAGELESS COMPUTER-ASSISTED NAVIGATION
Anesthesia: Choice | Laterality: Left

## 2018-04-12 NOTE — Discharge Instructions (Signed)
°  Instructions after Total Knee Replacement ° ° Zaccary P. Quaid Yeakle, Jr., M.D.    ° Dept. of Orthopaedics & Sports Medicine ° [facility] ° [address] ° [place], [place]  [address] ° Phone: [phone]   Fax: [phone] ° °  °DIET: °• Drink plenty of non-alcoholic fluids. °• Resume your normal diet. Include foods high in fiber. ° °ACTIVITY:  °• You may use crutches or a walker with weight-bearing as tolerated, unless instructed otherwise. °• You may be weaned off of the walker or crutches by your Physical Therapist.  °• Do NOT place pillows under the knee. Anything placed under the knee could limit your ability to straighten the knee.   °• Continue doing gentle exercises. Exercising will reduce the pain and swelling, increase motion, and prevent muscle weakness.   °• Please continue to use the TED compression stockings for 6 weeks. You may remove the stockings at night, but should reapply them in the morning. °• Do not drive or operate any equipment until instructed. ° °WOUND CARE:  °• Continue to use the PolarCare or ice packs periodically to reduce pain and swelling. °• You may bathe or shower after the staples are removed at the first office visit following surgery. ° °MEDICATIONS: °• You may resume your regular medications. °• Please take the pain medication as prescribed on the medication. °• Do not take pain medication on an empty stomach. °• You have been given a prescription for a blood thinner (Lovenox or Coumadin). Please take the medication as instructed. (NOTE: After completing a 2 week course of Lovenox, take one Enteric-coated aspirin once a day. This along with elevation will help reduce the possibility of phlebitis in your operated leg.) °• Do not drive or drink alcoholic beverages when taking pain medications. ° °CALL THE OFFICE FOR: °• Temperature above 101 degrees °• Excessive bleeding or drainage on the dressing. °• Excessive swelling, coldness, or paleness of the toes. °• Persistent  nausea and vomiting. ° °FOLLOW-UP:  °• You should have an appointment to return to the office in 10-14 days after surgery. °• Arrangements have been made for continuation of Physical Therapy (either home therapy or outpatient therapy). °  °

## 2018-04-13 ENCOUNTER — Other Ambulatory Visit: Payer: Self-pay

## 2018-04-13 ENCOUNTER — Encounter
Admission: RE | Admit: 2018-04-13 | Discharge: 2018-04-13 | Disposition: A | Discharge status: Home / Self Care | Payer: Medicare Other | Source: Ambulatory Visit | Attending: Orthopedic Surgery | Admitting: Orthopedic Surgery

## 2018-04-13 DIAGNOSIS — Z01812 Encounter for preprocedural laboratory examination: Secondary | ICD-10-CM | POA: Insufficient documentation

## 2018-04-13 LAB — URINALYSIS, ROUTINE W REFLEX MICROSCOPIC
BILIRUBIN URINE: NEGATIVE
Glucose, UA: NEGATIVE mg/dL
Hgb urine dipstick: NEGATIVE
Ketones, ur: NEGATIVE mg/dL
Leukocytes,Ua: NEGATIVE
Nitrite: NEGATIVE
Protein, ur: NEGATIVE mg/dL
Specific Gravity, Urine: 1.024 (ref 1.005–1.030)
pH: 6 (ref 5.0–8.0)

## 2018-04-13 LAB — COMPREHENSIVE METABOLIC PANEL
ALT: 28 U/L (ref 0–44)
AST: 28 U/L (ref 15–41)
Albumin: 4.2 g/dL (ref 3.5–5.0)
Alkaline Phosphatase: 55 U/L (ref 38–126)
Anion gap: 8 (ref 5–15)
BUN: 23 mg/dL (ref 8–23)
CO2: 26 mmol/L (ref 22–32)
Calcium: 9.3 mg/dL (ref 8.9–10.3)
Chloride: 106 mmol/L (ref 98–111)
Creatinine, Ser: 0.87 mg/dL (ref 0.61–1.24)
GFR calc Af Amer: >60 mL/min (ref >60)
GFR calc non Af Amer: >60 mL/min (ref >60)
Glucose, Bld: 106 mg/dL — ABNORMAL HIGH (ref 70–99)
Potassium: 3.9 mmol/L (ref 3.5–5.1)
Sodium: 140 mmol/L (ref 135–145)
Total Bilirubin: 0.7 mg/dL (ref 0.3–1.2)
Total Protein: 7.2 g/dL (ref 6.5–8.1)

## 2018-04-13 LAB — SURGICAL PCR SCREEN
MRSA, PCR: NEGATIVE
STAPHYLOCOCCUS AUREUS: NEGATIVE

## 2018-04-13 LAB — CBC
HCT: 46.8 % (ref 39.0–52.0)
Hemoglobin: 15.7 g/dL (ref 13.0–17.0)
MCH: 31.2 pg (ref 26.0–34.0)
MCHC: 33.5 g/dL (ref 30.0–36.0)
MCV: 92.9 fL (ref 80.0–100.0)
Platelets: 214 10*3/uL (ref 150–400)
RBC: 5.04 MIL/uL (ref 4.22–5.81)
RDW: 12.5 % (ref 11.5–15.5)
WBC: 6.3 10*3/uL (ref 4.0–10.5)
nRBC: 0 % (ref 0.0–0.2)

## 2018-04-13 LAB — TYPE AND SCREEN
ABO/RH(D): O POS
Antibody Screen: NEGATIVE

## 2018-04-13 LAB — PROTIME-INR
INR: 0.9 (ref 0.8–1.2)
Prothrombin Time: 12 seconds (ref 11.4–15.2)

## 2018-04-13 LAB — APTT: aPTT: 38 seconds — ABNORMAL HIGH (ref 24–36)

## 2018-04-13 LAB — SEDIMENTATION RATE: Sed Rate: 4 mm/hr (ref 0–20)

## 2018-04-13 LAB — C-REACTIVE PROTEIN: CRP: <0.8 mg/dL (ref <1.0)

## 2018-04-13 NOTE — Patient Instructions (Addendum)
Your procedure is scheduled on: Wednesday 04/22/18  Report to DAY SURGERY DEPARTMENT LOCATED ON 2ND FLOOR MEDICAL MALL ENTRANCE. To find out your arrival time please call [phone] between 1PM - 3PM on Tuesday 04/21/18.     Remember: Instructions that are not followed completely may result in serious medical risk, up to and including death, or upon the discretion of your surgeon and anesthesiologist your surgery may need to be rescheduled.      _X__ 1. Do not eat food after midnight the night before your procedure.                 No gum chewing or hard candies. You may drink clear liquids up to 2 hours                 before you are scheduled to arrive for your surgery- DO NOT drink clear                 liquids within 2 hours of the start of your surgery.                 Clear Liquids include:  water, apple juice without pulp, clear carbohydrate                 drink such as Clearfast or Gatorade, Black Coffee or Tea (Do not add                 milk or creamer to coffee or tea).   __X__2.  On the morning of surgery brush your teeth with toothpaste and water, you may rinse your mouth with mouthwash if you wish.  Do not swallow any toothpaste or mouthwash.      _X__ 3.  No Alcohol for 24 hours before or after surgery.    _X__ 4.  Do Not Smoke or use e-cigarettes For 24 Hours Prior to Your Surgery.                 Do not use any chewable tobacco products for at least 6 hours prior to                 Surgery.   __X__5.  Notify your doctor if there is any change in your medical condition      (cold, fever, infections).      Do not wear jewelry, make-up, hairpins, clips or nail polish. Do not wear lotions, powders, or perfumes.  Do not shave 48 hours prior to surgery. Men may shave face and neck. Do not bring valuables to the hospital.    [facility] is not responsible for any belongings or valuables.   Contacts, dentures/partials or body piercings may not be worn into  surgery. Bring a case for your contacts, glasses or hearing aids, a denture cup will be supplied.   Leave your suitcase in the car. After surgery it may be brought to your room.   For patients admitted to the hospital, discharge time is determined by your treatment team.    Please read over the following fact sheets that you were given:   MRSA Information   __X__ Take these medicines the morning of surgery with A SIP OF WATER:     1. ADVAIR DISKUS 500-50 MCG/DOSE AEPB  2. albuterol (PROVENTIL) (2.5 MG/3ML) 0.083% nebulizer solution  3. SPIRIVA HANDIHALER 18 MCG inhalation capsule  4. finasteride (PROSCAR) 5 MG tablet  5. acetaminophen (TYLENOL) 500 MG tablet if needed     __X__  Use CHG Soap as directed    _ X___ Use inhalers on the day of surgery. Also bring the inhaler with you to the hospital on the morning of surgery.    __X__ Stop Anti-inflammatories 7 days before surgery such as Advil, Ibuprofen, Motrin, BC or Goodies Powder, Naprosyn, Naproxen, Aleve, Aspirin, Meloxicam. May take Tylenol if needed for pain or discomfort.     __X__ Please do not start taking any new herbal supplements before your surgery.    __X__ Bring C-Pap to the hospital.

## 2018-04-14 LAB — URINE CULTURE
Culture: NO GROWTH
Special Requests: NORMAL

## 2018-04-22 ENCOUNTER — Encounter: Admission: RE | Payer: Self-pay | Source: Ambulatory Visit

## 2018-04-22 ENCOUNTER — Inpatient Hospital Stay: Admission: RE | Admit: 2018-04-22 | Payer: Medicare Other | Source: Ambulatory Visit | Admitting: Orthopedic Surgery

## 2018-04-22 SURGERY — ARTHROPLASTY, KNEE, TOTAL, USING IMAGELESS COMPUTER-ASSISTED NAVIGATION
Anesthesia: Choice | Laterality: Left

## 2018-06-24 ENCOUNTER — Inpatient Hospital Stay: Admission: RE | Admit: 2018-06-24 | Payer: Medicare Other | Source: Ambulatory Visit

## 2018-06-30 ENCOUNTER — Inpatient Hospital Stay: Admission: RE | Admit: 2018-06-30 | Payer: Medicare Other | Source: Ambulatory Visit

## 2018-07-02 ENCOUNTER — Other Ambulatory Visit: Payer: Medicare Other

## 2018-10-14 ENCOUNTER — Other Ambulatory Visit: Payer: Self-pay

## 2018-10-14 DIAGNOSIS — Z20822 Contact with and (suspected) exposure to covid-19: Secondary | ICD-10-CM

## 2018-10-15 LAB — NOVEL CORONAVIRUS, NAA: SARS-CoV-2, NAA: NOT DETECTED

## 2018-11-28 ENCOUNTER — Inpatient Hospital Stay: Admit: 2018-11-28 | Payer: Medicare Other | Admitting: Orthopedic Surgery

## 2018-11-28 SURGERY — ARTHROPLASTY, KNEE, TOTAL
Anesthesia: Choice | Laterality: Left

## 2019-05-17 ENCOUNTER — Ambulatory Visit
Admission: RE | Admit: 2019-05-17 | Discharge: 2019-05-17 | Disposition: A | Discharge status: Home / Self Care | Payer: Medicare PPO | Source: Ambulatory Visit | Attending: Internal Medicine | Admitting: Internal Medicine

## 2019-05-17 ENCOUNTER — Other Ambulatory Visit: Payer: Self-pay

## 2019-05-17 ENCOUNTER — Other Ambulatory Visit: Payer: Self-pay | Admitting: Internal Medicine

## 2019-05-17 DIAGNOSIS — R600 Localized edema: Secondary | ICD-10-CM

## 2019-05-28 ENCOUNTER — Emergency Department: Payer: Medicare PPO

## 2019-05-28 ENCOUNTER — Observation Stay
Admission: EM | Admit: 2019-05-28 | Discharge: 2019-05-29 | Disposition: A | Discharge status: Home / Self Care | Payer: Medicare PPO | Attending: Family Medicine | Admitting: Family Medicine

## 2019-05-28 ENCOUNTER — Other Ambulatory Visit: Payer: Self-pay

## 2019-05-28 DIAGNOSIS — K529 Noninfective gastroenteritis and colitis, unspecified: Principal | ICD-10-CM | POA: Insufficient documentation

## 2019-05-28 DIAGNOSIS — F17210 Nicotine dependence, cigarettes, uncomplicated: Secondary | ICD-10-CM | POA: Insufficient documentation

## 2019-05-28 DIAGNOSIS — Z888 Allergy status to other drugs, medicaments and biological substances status: Secondary | ICD-10-CM | POA: Insufficient documentation

## 2019-05-28 DIAGNOSIS — N401 Enlarged prostate with lower urinary tract symptoms: Secondary | ICD-10-CM | POA: Diagnosis not present

## 2019-05-28 DIAGNOSIS — Z79899 Other long term (current) drug therapy: Secondary | ICD-10-CM | POA: Diagnosis not present

## 2019-05-28 DIAGNOSIS — Z20822 Contact with and (suspected) exposure to covid-19: Secondary | ICD-10-CM | POA: Diagnosis not present

## 2019-05-28 DIAGNOSIS — M1990 Unspecified osteoarthritis, unspecified site: Secondary | ICD-10-CM | POA: Diagnosis not present

## 2019-05-28 DIAGNOSIS — Z66 Do not resuscitate: Secondary | ICD-10-CM | POA: Diagnosis not present

## 2019-05-28 DIAGNOSIS — G4733 Obstructive sleep apnea (adult) (pediatric): Secondary | ICD-10-CM | POA: Diagnosis not present

## 2019-05-28 DIAGNOSIS — R112 Nausea with vomiting, unspecified: Secondary | ICD-10-CM

## 2019-05-28 DIAGNOSIS — E785 Hyperlipidemia, unspecified: Secondary | ICD-10-CM | POA: Insufficient documentation

## 2019-05-28 DIAGNOSIS — J449 Chronic obstructive pulmonary disease, unspecified: Secondary | ICD-10-CM | POA: Insufficient documentation

## 2019-05-28 DIAGNOSIS — N400 Enlarged prostate without lower urinary tract symptoms: Secondary | ICD-10-CM | POA: Diagnosis not present

## 2019-05-28 DIAGNOSIS — Z7951 Long term (current) use of inhaled steroids: Secondary | ICD-10-CM | POA: Diagnosis not present

## 2019-05-28 DIAGNOSIS — E7800 Pure hypercholesterolemia, unspecified: Secondary | ICD-10-CM | POA: Insufficient documentation

## 2019-05-28 LAB — COMPREHENSIVE METABOLIC PANEL
ALT: 24 U/L (ref 0–44)
AST: 23 U/L (ref 15–41)
Albumin: 3.9 g/dL (ref 3.5–5.0)
Alkaline Phosphatase: 53 U/L (ref 38–126)
Anion gap: 10 (ref 5–15)
BUN: 20 mg/dL (ref 8–23)
CO2: 26 mmol/L (ref 22–32)
Calcium: 9.1 mg/dL (ref 8.9–10.3)
Chloride: 102 mmol/L (ref 98–111)
Creatinine, Ser: 0.94 mg/dL (ref 0.61–1.24)
GFR calc Af Amer: >60 mL/min (ref >60)
GFR calc non Af Amer: >60 mL/min (ref >60)
Glucose, Bld: 219 mg/dL — ABNORMAL HIGH (ref 70–99)
Potassium: 4 mmol/L (ref 3.5–5.1)
Sodium: 138 mmol/L (ref 135–145)
Total Bilirubin: 1.4 mg/dL — ABNORMAL HIGH (ref 0.3–1.2)
Total Protein: 7.3 g/dL (ref 6.5–8.1)

## 2019-05-28 LAB — CBC
HCT: 46.5 % (ref 39.0–52.0)
Hemoglobin: 15.6 g/dL (ref 13.0–17.0)
MCH: 31.4 pg (ref 26.0–34.0)
MCHC: 33.5 g/dL (ref 30.0–36.0)
MCV: 93.6 fL (ref 80.0–100.0)
Platelets: 220 10*3/uL (ref 150–400)
RBC: 4.97 MIL/uL (ref 4.22–5.81)
RDW: 12.2 % (ref 11.5–15.5)
WBC: 14.5 10*3/uL — ABNORMAL HIGH (ref 4.0–10.5)
nRBC: 0 % (ref 0.0–0.2)

## 2019-05-28 LAB — LIPASE, BLOOD: Lipase: 17 U/L (ref 11–51)

## 2019-05-28 LAB — RESPIRATORY PANEL BY RT PCR (FLU A&B, COVID)
Influenza A by PCR: NEGATIVE
Influenza B by PCR: NEGATIVE
SARS Coronavirus 2 by RT PCR: NEGATIVE

## 2019-05-28 MED ORDER — ALBUTEROL SULFATE (2.5 MG/3ML) 0.083% IN NEBU: 3.0000 mL | INHALATION_SOLUTION | Freq: Four times a day (QID) | RESPIRATORY_TRACT | Status: DC | PRN

## 2019-05-28 MED ORDER — ONDANSETRON HCL 4 MG/2ML IJ SOLN
4.0000 mg | Freq: Once | INTRAMUSCULAR | Status: AC
  Administered 2019-05-28: 4 mg via INTRAVENOUS
  Filled 2019-05-28: qty 2

## 2019-05-28 MED ORDER — IOHEXOL 300 MG/ML  SOLN
100.0000 mL | Freq: Once | INTRAMUSCULAR | Status: AC | PRN
  Administered 2019-05-28: 100 mL via INTRAVENOUS

## 2019-05-28 MED ORDER — AZELASTINE HCL 0.1 % NA SOLN
2.0000 | Freq: Two times a day (BID) | NASAL | Status: DC
  Administered 2019-05-28 – 2019-05-29 (×2): 2 via NASAL
  Filled 2019-05-28 (×2): qty 30

## 2019-05-28 MED ORDER — SODIUM CHLORIDE 0.9 % IV SOLN: INTRAVENOUS | Status: DC

## 2019-05-28 MED ORDER — FLUTICASONE PROPIONATE 50 MCG/ACT NA SUSP
1.0000 | Freq: Two times a day (BID) | NASAL | Status: DC | PRN
  Filled 2019-05-28: qty 16

## 2019-05-28 MED ORDER — MORPHINE SULFATE (PF) 4 MG/ML IV SOLN
4.0000 mg | Freq: Once | INTRAVENOUS | Status: AC
  Administered 2019-05-28: 4 mg via INTRAVENOUS
  Filled 2019-05-28: qty 1

## 2019-05-28 MED ORDER — PANTOPRAZOLE SODIUM 40 MG IV SOLR
40.0000 mg | INTRAVENOUS | Status: DC
  Administered 2019-05-28: 40 mg via INTRAVENOUS
  Filled 2019-05-28: qty 40

## 2019-05-28 MED ORDER — MOMETASONE FURO-FORMOTEROL FUM 200-5 MCG/ACT IN AERO
2.0000 | INHALATION_SPRAY | Freq: Two times a day (BID) | RESPIRATORY_TRACT | Status: DC
  Administered 2019-05-28 – 2019-05-29 (×3): 2 via RESPIRATORY_TRACT
  Filled 2019-05-28 (×2): qty 8.8

## 2019-05-28 MED ORDER — ACETAMINOPHEN 325 MG PO TABS
650.0000 mg | ORAL_TABLET | Freq: Four times a day (QID) | ORAL | Status: DC | PRN
  Administered 2019-05-28 – 2019-05-29 (×2): 650 mg via ORAL
  Filled 2019-05-28 (×2): qty 2

## 2019-05-28 MED ORDER — TIOTROPIUM BROMIDE MONOHYDRATE 18 MCG IN CAPS
18.0000 ug | ORAL_CAPSULE | Freq: Every day | RESPIRATORY_TRACT | Status: DC
  Filled 2019-05-28: qty 5

## 2019-05-28 MED ORDER — ONDANSETRON 4 MG PO TBDP
4.0000 mg | ORAL_TABLET | Freq: Once | ORAL | Status: AC | PRN
  Administered 2019-05-28: 4 mg via ORAL
  Filled 2019-05-28: qty 1

## 2019-05-28 MED ORDER — ENOXAPARIN SODIUM 40 MG/0.4ML ~~LOC~~ SOLN
40.0000 mg | SUBCUTANEOUS | Status: DC
  Administered 2019-05-28: 21:00:00 40 mg via SUBCUTANEOUS
  Filled 2019-05-28: qty 0.4

## 2019-05-28 MED ORDER — METOCLOPRAMIDE HCL 5 MG/ML IJ SOLN
10.0000 mg | Freq: Once | INTRAMUSCULAR | Status: AC
  Administered 2019-05-28: 10 mg via INTRAVENOUS
  Filled 2019-05-28: qty 2

## 2019-05-28 MED ORDER — METOCLOPRAMIDE HCL 5 MG/ML IJ SOLN
10.0000 mg | Freq: Four times a day (QID) | INTRAMUSCULAR | Status: DC
  Administered 2019-05-28 – 2019-05-29 (×3): 10 mg via INTRAVENOUS
  Filled 2019-05-28 (×3): qty 2

## 2019-05-28 NOTE — ED Notes (Signed)
ED Provider at bedside. 

## 2019-05-28 NOTE — H&P (Signed)
History and Physical    Kyle Wells ZOX:[id number] DOB: 1933-11-08 DOA: 05/28/2019  PCP: Lauro Regulus, MD   Patient coming from: Home  I have personally briefly reviewed patient's old medical records in [facility]  Chief Complaint: Vomiting  HPI: Kyle Wells is a 86 y.o. male with medical history significant for COPD, BPH, arthritis, history of prior SBO that was medically managed and asthma who presents to the emergency room for evaluation of vomiting for over 12 hours.  Patient states he has had about 20 episodes of vomiting since last night and the last time he had an episode like this he had a small bowel obstruction. He had a small bowel movement the night prior to his admission. He denies any specific abdominal pain at this time. He denies having chest pain, shortness of breath, cough, fever, chills, urinary symptoms, dizziness, headache or lightheadedness Labs revealed leukocytosis. CT scan of the abdomen and pelvis showed mild prominence of mid abdominal small bowel loops. Slight edema noted within the adjacent mesentery. This could reflect enteritis, infectious or ischemic. The distended fluid-filled stomach and fluid-filled distal esophagus.  ED Course: Patient seen in the emergency room for evaluation of intractable emesis.  Imaging shows fluid-filled esophagus and stomach and dilated loops of small bowel.  Will refer to the hospitalist for admission  Review of Systems: As per HPI otherwise 10 point review of systems negative.   Past Medical History:  Diagnosis Date  . Arthritis   . Asthma   . BPH (benign prostatic hyperplasia)   . COPD (chronic obstructive pulmonary disease) (HCC)   . Hypercholesterolemia   . Impingement syndrome of right shoulder   . OSA (obstructive sleep apnea)   . Small bowel obstruction [facility])     Past Surgical History:  Procedure Laterality Date  . COLONOSCOPY    . PROSTATE BIOPSY       reports that he has been  smoking cigarettes. He has been smoking about 0.10 packs per day. He has never used smokeless tobacco. He reports that he does not drink alcohol or use drugs.  Allergies  Allergen Reactions  . Zileuton Hives    [Onset: 10/27/2009]    . Roflumilast Other (See Comments)    insomnia   [Onset: 11/29/2010]     Family History  Problem Relation Age of Onset  . Asthma Father   . Heart attack Father      Prior to Admission medications   Medication Sig Start Date End Date Taking? Authorizing Provider  acetaminophen (TYLENOL) 500 MG tablet Take 500 mg by mouth every 6 (six) hours as needed (pain).    Yes [provider]  ADVAIR DISKUS 500-50 MCG/DOSE AEPB Inhale 1 puff into the lungs 2 (two) times daily.  09/12/14  Yes [provider]  albuterol (PROVENTIL HFA;VENTOLIN HFA) 108 (90 Base) MCG/ACT inhaler Inhale 2 puffs into the lungs every 4 (four) hours as needed for wheezing or shortness of breath.   Yes [provider]  albuterol (PROVENTIL) (2.5 MG/3ML) 0.083% nebulizer solution Inhale 3 mLs into the lungs every 6 (six) hours as needed for wheezing or shortness of breath.  02/21/14  Yes [provider]  azelastine (ASTELIN) 0.1 % nasal spray Place 2 sprays into both nostrils 2 (two) times daily. 05/21/19  Yes [provider]  chlorhexidine (PERIDEX) 0.12 % solution 15 mLs 2 (two) times daily. 05/26/19  Yes [provider]  finasteride (PROSCAR) 5 MG tablet Take 1 tablet (5 mg  total) by mouth daily. 09/21/14  Yes Florene Glen, MD  fluticasone [facility]) 50 MCG/ACT nasal spray Place 1 spray into both nostrils 2 (two) times daily as needed for allergies or rhinitis.    Yes [provider]  ibuprofen (ADVIL) 400 MG tablet Take 400 mg by mouth 3 (three) times daily. 05/26/19  Yes [provider]  montelukast (SINGULAIR) 10 MG tablet Take 1 tablet by mouth at bedtime.  09/12/14  Yes [provider]  Multiple Vitamin  (MULTIVITAMIN) capsule Take 1 capsule by mouth daily.   Yes [provider]  polyethylene glycol (MIRALAX / GLYCOLAX) packet Take 17 g by mouth daily. 05/09/17  Yes Epifanio Lesches, MD  sennosides-docusate sodium (SENOKOT-S) 8.6-50 MG tablet Take 1 tablet by mouth at bedtime.   Yes [provider]  SPIRIVA HANDIHALER 18 MCG inhalation capsule Place 18 mcg into inhaler and inhale daily.  09/13/14  Yes [provider]    Physical Exam: Vitals:   05/28/19 1000 05/28/19 1030 05/28/19 1100 05/28/19 1200  BP: 140/64 (!) 142/80 123/61 (!) 135/92  Pulse: 91 81 87 92  Resp: 17 17 18 18   Temp:      TempSrc:      SpO2: 95% 100% 94% 95%  Weight:      Height:         Vitals:   05/28/19 1000 05/28/19 1030 05/28/19 1100 05/28/19 1200  BP: 140/64 (!) 142/80 123/61 (!) 135/92  Pulse: 91 81 87 92  Resp: 17 17 18 18   Temp:      TempSrc:      SpO2: 95% 100% 94% 95%  Weight:      Height:        Constitutional: NAD, alert and oriented x 3. Patient appears comfortable Eyes: PERRL, lids and conjunctivae normal ENMT: Mucous membranes are moist.  Neck: normal, supple, no masses, no thyromegaly Respiratory: clear to auscultation bilaterally, no wheezing, no crackles. Normal respiratory effort. No accessory muscle use.  Cardiovascular: Regular rate and rhythm, no murmurs / rubs / gallops. No extremity edema. 2+ pedal pulses. No carotid bruits.  Abdomen: no tenderness, no masses palpated. No hepatosplenomegaly. Bowel sounds positive. Central adiposity Musculoskeletal: no clubbing / cyanosis. No joint deformity upper and lower extremities.  Skin: no rashes, lesions, ulcers.  Neurologic: No gross focal neurologic deficit. Psychiatric: Normal mood and affect.   Labs on Admission: I have personally reviewed following labs and imaging studies  CBC: Recent Labs  Lab 05/28/19 0603  WBC 14.5*  HGB 15.6  HCT 46.5  MCV 93.6  PLT 810   Basic Metabolic Panel: Recent  Labs  Lab 05/28/19 0603  NA 138  K 4.0  CL 102  CO2 26  GLUCOSE 219*  BUN 20  CREATININE 0.94  CALCIUM 9.1   GFR: Estimated Creatinine Clearance: 65.5 mL/min (by C-G formula based on SCr of 0.94 mg/dL). Liver Function Tests: Recent Labs  Lab 05/28/19 0603  AST 23  ALT 24  ALKPHOS 53  BILITOT 1.4*  PROT 7.3  ALBUMIN 3.9   Recent Labs  Lab 05/28/19 0603  LIPASE 17   No results for input(s): AMMONIA in the last 168 hours. Coagulation Profile: No results for input(s): INR, PROTIME in the last 168 hours. Cardiac Enzymes: No results for input(s): CKTOTAL, CKMB, CKMBINDEX, TROPONINI in the last 168 hours. BNP (last 3 results) No results for input(s): PROBNP in the last 8760 hours. HbA1C: No results for input(s): HGBA1C in the last 72 hours. CBG: No  results for input(s): GLUCAP in the last 168 hours. Lipid Profile: No results for input(s): CHOL, HDL, LDLCALC, TRIG, CHOLHDL, LDLDIRECT in the last 72 hours. Thyroid Function Tests: No results for input(s): TSH, T4TOTAL, FREET4, T3FREE, THYROIDAB in the last 72 hours. Anemia Panel: No results for input(s): VITAMINB12, FOLATE, FERRITIN, TIBC, IRON, RETICCTPCT in the last 72 hours. Urine analysis:    Component Value Date/Time   COLORURINE YELLOW (A) 04/13/2018 1351   APPEARANCEUR CLEAR (A) 04/13/2018 1351   LABSPEC 1.024 04/13/2018 1351   PHURINE 6.0 04/13/2018 1351   GLUCOSEU NEGATIVE 04/13/2018 1351   HGBUR NEGATIVE 04/13/2018 1351   BILIRUBINUR NEGATIVE 04/13/2018 1351   KETONESUR NEGATIVE 04/13/2018 1351   PROTEINUR NEGATIVE 04/13/2018 1351   NITRITE NEGATIVE 04/13/2018 1351   LEUKOCYTESUR NEGATIVE 04/13/2018 1351    Radiological Exams on Admission: CT ABDOMEN PELVIS W CONTRAST  Result Date: 05/28/2019 CLINICAL DATA:  Bowel obstruction suspected.  Vomiting. EXAM: CT ABDOMEN AND PELVIS WITH CONTRAST TECHNIQUE: Multidetector CT imaging of the abdomen and pelvis was performed using the standard protocol following  bolus administration of intravenous contrast. CONTRAST:  OMNIPAQUE IOHEXOL 300 MG/ML  SOLN COMPARISON:  05/06/2017 FINDINGS: Lower chest: Mild cardiomegaly. Areas of bibasilar scarring or atelectasis. Small hiatal hernia. No effusions. Hepatobiliary: No focal hepatic abnormality. Gallbladder unremarkable. Pancreas: No focal abnormality or ductal dilatation. Spleen: No focal abnormality.  Normal size. Adrenals/Urinary Tract: No adrenal abnormality. No focal renal abnormality. No stones or hydronephrosis. Urinary bladder is unremarkable. Stomach/Bowel: Colonic diverticulosis, most pronounced in the sigmoid colon. No active diverticulitis.Stomach and distal esophagus are fluid-filled. Proximal small bowel is decompressed. There are mid small bowel loops which are mildly dilated with associated mild mesenteric edema. Distal small bowel loops are also decompressed. Vascular/Lymphatic: Aortic atherosclerosis. No evidence of aneurysm or adenopathy. Reproductive: Prostate enlargement. Other: No free fluid or free air. Musculoskeletal: No acute bony abnormality. Chronic compression fracture at L1, stable. IMPRESSION: Mild prominence of mid abdominal small bowel loops. Slight edema noted within the adjacent mesentery. This could reflect enteritis, infectious or ischemic. The distended fluid-filled stomach and fluid-filled distal esophagus. Colonic diverticulosis.  No active diverticulitis. Aortic atherosclerosis. Prostate enlargement. Electronically Signed   By: Charlett Nose M.D.   On: 05/28/2019 08:48    EKG: Independently reviewed.   Assessment/Plan Principal Problem:   Intractable nausea and vomiting Active Problems:   Chronic obstructive pulmonary disease (HCC)   BPH (benign prostatic hyperplasia)    Intractable nausea and vomiting Etiology unclear Imaging shows distended fluid-filled stomach and fluid-filled distal esophagus. Mild prominence of mid abdominal small bowel loops. Slight edema noted  within the adjacent mesentery. This could reflect enteritis, infectious or ischemic. Keep patient n.p.o. Supportive care with IV fluid hydration and antiemetics Will consult surgery   COPD Not acutely exacerbated Continue as needed bronchodilator therapy Continue Spiriva   DVT prophylaxis: Lovenox Code Status: Full code Family Communication: Greater than 50% of time was spent discussing plan of care with patient and his daughter at the bedside.  All questions and concerns have been addressed.  CODE STATUS was discussed and patient is a DO NOT RESUSCITATE. Disposition Plan: Back to previous home environment Consults called: Surgery     Drayce Tawil MD [facility]     05/28/2019, 1:08 PM

## 2019-05-28 NOTE — ED Notes (Signed)
This RN at bedside. Pt able to tolerate some of the graham crackers, peanut butter and ginger ale provided. Pt stating he would like to be admitted due to still not feeling well. Pt family stating upon discharge they feel strongly he would be back tomorrow. MD and pt's RN, Marthann Schiller, made aware.

## 2019-05-28 NOTE — Consult Note (Addendum)
[facility] SURGICAL CONSULTATION NOTE (initial) - cpt: 67209   HISTORY OF PRESENT ILLNESS (HPI):  86 y.o. male presented to [facility] ED today for evaluation of nausea and emesis. Patient reports that he noticed that "his stomach was trying to be upset" starting about 12 hours ago. Following this he reports a numerous number of episodes of nausea and emesis. Emesis was non-bloody and non-bilious. No associated abdominal pain. No fever, chills, cough, SOB, CP, or urinary changes. He is still having bowel function. He reports he has had similar episodes in the past and managed like a small bowel obstruction but typically resolved on his own. He has never required any intervention and has never had any intra-abdominal surgeries. Work up in the ED revealed a mild leukocytosis to 14.5K which may be explained by multiple episodes of emesis and CT Abdomen/Pelvis concerning for gastric dilation and a few loops of dilated bowel.   Surgery is consulted by hospitalist physician Dr. Joylene Igo in this context for evaluation and management of possible bowel obstruction.  PAST MEDICAL HISTORY (PMH):  Past Medical History:  Diagnosis Date   Arthritis    Asthma    BPH (benign prostatic hyperplasia)    COPD (chronic obstructive pulmonary disease) (HCC)    Hypercholesterolemia    Impingement syndrome of right shoulder    OSA (obstructive sleep apnea)    Small bowel obstruction (HCC)      PAST SURGICAL HISTORY (PSH):  Past Surgical History:  Procedure Laterality Date   COLONOSCOPY     PROSTATE BIOPSY       MEDICATIONS:  Prior to Admission medications   Medication Sig Start Date End Date Taking? Authorizing Provider  acetaminophen (TYLENOL) 500 MG tablet Take 500 mg by mouth every 6 (six) hours as needed (pain).    Yes [provider]  ADVAIR DISKUS 500-50 MCG/DOSE AEPB Inhale 1 puff into the lungs 2 (two) times daily.  09/12/14  Yes [provider]  albuterol (PROVENTIL  HFA;VENTOLIN HFA) 108 (90 Base) MCG/ACT inhaler Inhale 2 puffs into the lungs every 4 (four) hours as needed for wheezing or shortness of breath.   Yes [provider]  albuterol (PROVENTIL) (2.5 MG/3ML) 0.083% nebulizer solution Inhale 3 mLs into the lungs every 6 (six) hours as needed for wheezing or shortness of breath.  02/21/14  Yes [provider]  azelastine (ASTELIN) 0.1 % nasal spray Place 2 sprays into both nostrils 2 (two) times daily. 05/21/19  Yes [provider]  chlorhexidine (PERIDEX) 0.12 % solution 15 mLs 2 (two) times daily. 05/26/19  Yes [provider]  finasteride (PROSCAR) 5 MG tablet Take 1 tablet (5 mg total) by mouth daily. 09/21/14  Yes Lattie Haw, MD  fluticasone [facility]) 50 MCG/ACT nasal spray Place 1 spray into both nostrils 2 (two) times daily as needed for allergies or rhinitis.    Yes [provider]  ibuprofen (ADVIL) 400 MG tablet Take 400 mg by mouth 3 (three) times daily. 05/26/19  Yes [provider]  montelukast (SINGULAIR) 10 MG tablet Take 1 tablet by mouth at bedtime.  09/12/14  Yes [provider]  Multiple Vitamin (MULTIVITAMIN) capsule Take 1 capsule by mouth daily.   Yes [provider]  polyethylene glycol (MIRALAX / GLYCOLAX) packet Take 17 g by mouth daily. 05/09/17  Yes Katha Hamming, MD  sennosides-docusate sodium (SENOKOT-S) 8.6-50 MG tablet Take 1 tablet by mouth at bedtime.   Yes [provider]  SPIRIVA HANDIHALER 18 MCG inhalation  capsule Place 18 mcg into inhaler and inhale daily.  09/13/14  Yes [provider]     ALLERGIES:  Allergies  Allergen Reactions   Zileuton Hives    [Onset: 10/27/2009]     Roflumilast Other (See Comments)    insomnia   [Onset: 11/29/2010]      SOCIAL HISTORY:  Social History   Socioeconomic History   Marital status: Married    Spouse name: Not on file   Number of children: Not on file   Years of education:  Not on file   Highest education level: Not on file  Occupational History   Not on file  Tobacco Use   Smoking status: Current Some Day Smoker    Packs/day: 0.10    Types: Cigarettes   Smokeless tobacco: Never Used  Substance and Sexual Activity   Alcohol use: No   Drug use: No   Sexual activity: Not Currently  Other Topics Concern   Not on file  Social History Narrative   Not on file   Social Determinants of Health   Financial Resource Strain:    Difficulty of Paying Living Expenses:   Food Insecurity:    Worried About Programme researcher, broadcasting/film/video in the Last Year:    Barista in the Last Year:   Transportation Needs:    Freight forwarder (Medical):    Lack of Transportation (Non-Medical):   Physical Activity:    Days of Exercise per Week:    Minutes of Exercise per Session:   Stress:    Feeling of Stress :   Social Connections:    Frequency of Communication with Friends and Family:    Frequency of Social Gatherings with Friends and Family:    Attends Religious Services:    Active Member of Clubs or Organizations:    Attends Engineer, structural:    Marital Status:   Intimate Partner Violence:    Fear of Current or Ex-Partner:    Emotionally Abused:    Physically Abused:    Sexually Abused:      FAMILY HISTORY:  Family History  Problem Relation Age of Onset   Asthma Father    Heart attack Father       REVIEW OF SYSTEMS:  Review of Systems  Constitutional: Negative for chills and fever.  HENT: Negative for congestion and sore throat.   Respiratory: Negative for cough and shortness of breath.   Cardiovascular: Negative for chest pain and palpitations.  Gastrointestinal: Positive for nausea and vomiting. Negative for abdominal pain, blood in stool, constipation and diarrhea.  Genitourinary: Negative for dysuria and urgency.  All other systems reviewed and are negative.   VITAL SIGNS:  Temp:  [98 F (36.7 C)] 98 F (36.7 C) (04/30  0558) Pulse Rate:  [79-93] 80 (04/30 1300) Resp:  [17-18] 18 (04/30 1200) BP: (105-147)/(59-97) 141/89 (04/30 1300) SpO2:  [94 %-100 %] 96 % (04/30 1300) Weight:  [95.3 kg] 95.3 kg (04/30 0553)     Height: 5\' 7"  (170.2 cm) Weight: 95.3 kg BMI (Calculated): 32.88   INTAKE/OUTPUT:  No intake/output data recorded.  PHYSICAL EXAM:  Physical Exam Vitals reviewed. Exam conducted with a chaperone present.  Constitutional:      General: He is not in acute distress.    Appearance: Normal appearance. He is obese. He is not ill-appearing.  HENT:     Head: Normocephalic and atraumatic.     Nose:     Comments: Nasal Canula in place Eyes:  General: No scleral icterus.    Conjunctiva/sclera: Conjunctivae normal.     Pupils: Pupils are equal, round, and reactive to light.  Cardiovascular:     Rate and Rhythm: Normal rate and regular rhythm.     Pulses: Normal pulses.     Heart sounds: No murmur.  Pulmonary:     Effort: Pulmonary effort is normal. No respiratory distress.     Breath sounds: Normal breath sounds.     Comments: On [place], at baseline Abdominal:     General: There is no distension.     Palpations: Abdomen is soft.     Tenderness: There is no abdominal tenderness. There is no guarding or rebound.     Comments: Abdomen is obese, soft, non-tender, non-distended, no rebound/guarding  Genitourinary:    Comments: Deferred Musculoskeletal:     Right lower leg: No edema.     Left lower leg: No edema.  Skin:    General: Skin is warm and dry.     Coloration: Skin is not pale.     Findings: No erythema.  Neurological:     General: No focal deficit present.     Mental Status: He is alert and oriented to person, place, and time.      Labs:  CBC Latest Ref Rng & Units 05/28/2019 04/13/2018 01/23/2018  WBC 4.0 - 10.5 K/uL 14.5(H) 6.3 6.5  Hemoglobin 13.0 - 17.0 g/dL 04.5 40.9 81.1  Hematocrit 39.0 - 52.0 % 46.5 46.8 47.0  Platelets 150 - 400 K/uL 220 214 218   CMP Latest Ref  Rng & Units 05/28/2019 04/13/2018 01/23/2018  Glucose 70 - 99 mg/dL 914(N) 829(F) 621(H)  BUN 8 - 23 mg/dL 20 23 20   Creatinine 0.61 - 1.24 mg/dL 0.86 5.78  Sodium 135 - 145 mmol/L 138 140 139  Potassium 3.5 - 5.1 mmol/L 4.0 3.9 3.7  Chloride 98 - 111 mmol/L 102 106 106  CO2 22 - 32 mmol/L 26 26 23   Calcium 8.9 - 10.3 mg/dL 9.1 9.3 9.3  Total Protein 6.5 - 8.1 g/dL 7.3 7.2 7.3  Total Bilirubin 0.3 - 1.2 mg/dL 4.69) 0.7 0.7  Alkaline Phos 38 - 126 U/L 53 55 69  AST 15 - 41 U/L 23 28 37  ALT 0 - 44 U/L 24 28 31      Imaging studies:   CT Abdomen/Pelvis (05/28/2019) personally reviewed showing gastric dilation with fluid in the distal esophagus and there are dilated loops of bowel in the right abdomen however the remaining small bowel more proximally and distally appears decompressed, and radiologist report reviewed below: IMPRESSION: Mild prominence of mid abdominal small bowel loops. Slight edema noted within the adjacent mesentery. This could reflect enteritis, infectious or ischemic.   The distended fluid-filled stomach and fluid-filled distal esophagus.   Colonic diverticulosis.  No active diverticulitis.   Aortic atherosclerosis.   Prostate enlargement.   Assessment/Plan: (ICD-10's: R11.2) 86 y.o. male with multiple episodes of nausea and emesis with leukocytosis likely explained by frequent emesis and distended stomach and a few loops of dilated small bowel with proximal and distal decompression likely representing gastroenteritis. Small bowel obstruction is unlikely given lack of previous abdominal surgeries and no other intra-abdominal etiology on CT. He has multiple similar presentations in the past and typically resolved without intervention in 48 hours.    - No emergent surgical intervention  - NPO for now; if symptoms improved/resolved would resume CLD and advance as tolerated  - Can hold off NGT placement for now,  could place for symptom control    - Antiemetics  prn  - monitor abdominal examination  - IVF resuscitation   - Further management per primary service   All of the above findings and recommendations were discussed with the patient, and all of patient's questions were answered to his expressed satisfaction. No surgical indications; we will sign off.  Thank you for the opportunity to participate in this patient's care.   -- Edison Simon, PA-C [facility] 05/28/2019, 3:20 PM [phone] M-F: 7am - 4pm'  I saw and evaluated the patient.  I agree with the above documentation, exam, and plan, which I have edited where appropriate. Fredirick Maudlin  4:43 PM

## 2019-05-28 NOTE — ED Provider Notes (Signed)
[facility] Emergency Department Provider Note       Time seen: ----------------------------------------- 7:25 AM on 05/28/2019 -----------------------------------------   I have reviewed the triage vital signs and the nursing notes.  HISTORY   Chief Complaint Emesis    HPI Kyle Wells is a 86 y.o. male with a history of arthritis, asthma, BPH, COPD, hyperlipidemia, small bowel obstruction who presents to the ED for vomiting since 9 PM last night.  Patient denies any specific abdominal pain.  He reports 20 episodes of vomiting since last night, last episode like this was related to a small bowel obstruction.  Past Medical History:  Diagnosis Date  . Arthritis   . Asthma   . BPH (benign prostatic hyperplasia)   . COPD (chronic obstructive pulmonary disease) ([place])   . Hypercholesterolemia   . Impingement syndrome of right shoulder   . OSA (obstructive sleep apnea)   . Small bowel obstruction [facility])     Patient Active Problem List   Diagnosis Date Noted  . SBO (small bowel obstruction) ([place]) 05/07/2017  . Small bowel obstruction ([place]) 12/10/2016  . Healthcare maintenance 12/02/2016  . Nausea   . Obesity, unspecified 09/26/2016  . Obstructive sleep apnea on CPAP 11/02/2015  . Chronic obstructive pulmonary disease ([place])   . Intestinal obstruction ([place])   . Ileus ([place]) 09/19/2014  . Impingement syndrome of shoulder, right 05/13/2014  . Benign localized hyperplasia of prostate with urinary obstruction and other lower urinary tract symptoms (LUTS)(600.21) 03/30/2014  . Hypercholesteremia 03/30/2014  . Sleep apnea 03/03/2012  . Benign localized prostatic hyperplasia with lower urinary tract symptoms (LUTS) 10/16/2011  . Elevated prostate specific antigen (PSA) 10/16/2011  . Enlarged prostate with lower urinary tract symptoms (LUTS) 10/16/2011  . Incomplete emptying of bladder 10/16/2011  . Frequency of micturition 10/16/2011  . Retention of  urine 10/16/2011  . Asthma with COPD ([place]) 05/31/2011  . Chronic obstructive asthma ([place]) 05/31/2011  . House dust mite allergy 10/20/2010  . Other allergy, other than to medicinal agents 10/20/2010    Past Surgical History:  Procedure Laterality Date  . COLONOSCOPY    . PROSTATE BIOPSY      Allergies Zileuton and Roflumilast  Social History Social History   Tobacco Use  . Smoking status: Current Some Day Smoker    Packs/day: 0.10    Types: Cigarettes  . Smokeless tobacco: Never Used  Substance Use Topics  . Alcohol use: No  . Drug use: No    Review of Systems Constitutional: Negative for fever. Cardiovascular: Negative for chest pain. Respiratory: Negative for shortness of breath. Gastrointestinal: Negative for abdominal pain, positive for vomiting Musculoskeletal: Negative for back pain. Skin: Negative for rash. Neurological: Negative for headaches, focal weakness or numbness.  All systems negative/normal/unremarkable except as stated in the HPI  ____________________________________________   PHYSICAL EXAM:  VITAL SIGNS: ED Triage Vitals  Enc Vitals Group     BP 05/28/19 0558 105/70     Pulse Rate 05/28/19 0558 79     Resp 05/28/19 0558 18     Temp 05/28/19 0558 98 F (36.7 C)     Temp Source 05/28/19 0558 Oral     SpO2 05/28/19 0558 94 %     Weight 05/28/19 0553 210 lb (95.3 kg)     Height 05/28/19 0553 5\' 7"  (1.702 m)     Head Circumference --      Peak Flow --      Pain Score 05/28/19 0553 0  Pain Loc --      Pain Edu? --      Excl. in GC? --     Constitutional: Alert and oriented.  Mild distress Eyes: Conjunctivae are normal. Normal extraocular movements. Cardiovascular: Normal rate, regular rhythm. No murmurs, rubs, or gallops. Respiratory: Normal respiratory effort without tachypnea nor retractions. Breath sounds are clear and equal bilaterally. No wheezes/rales/rhonchi. Gastrointestinal: Distended, high-pitched bowel  sounds Musculoskeletal: Nontender with normal range of motion in extremities. No lower extremity tenderness nor edema. Neurologic:  Normal speech and language. No gross focal neurologic deficits are appreciated.  Skin:  Skin is warm, dry and intact. No rash noted. Psychiatric: Mood and affect are normal. Speech and behavior are normal.  ___________________________________________  ED COURSE:  As part of my medical decision making, I reviewed the following data within the electronic MEDICAL RECORD NUMBER History obtained from family if available, nursing notes, old chart and ekg, as well as notes from prior ED visits. Patient presented for abdominal pain and vomiting, we will assess with labs and imaging as indicated at this time.   Procedures  Kyle Wells was evaluated in Emergency Department on 05/28/2019 for the symptoms described in the history of present illness. He was evaluated in the context of the global COVID-19 pandemic, which necessitated consideration that the patient might be at risk for infection with the SARS-CoV-2 virus that causes COVID-19. Institutional protocols and algorithms that pertain to the evaluation of patients at risk for COVID-19 are in a state of rapid change based on information released by regulatory bodies including the CDC and federal and state organizations. These policies and algorithms were followed during the patient's care in the ED.  ____________________________________________   LABS (pertinent positives/negatives)  Labs Reviewed  COMPREHENSIVE METABOLIC PANEL - Abnormal; Notable for the following components:      Result Value   Glucose, Bld 219 (*)    Total Bilirubin 1.4 (*)    All other components within normal limits  CBC - Abnormal; Notable for the following components:   WBC 14.5 (*)    All other components within normal limits  LIPASE, BLOOD    RADIOLOGY Images were viewed by me  CT the abdomen pelvis with contrast IMPRESSION: Mild  prominence of mid abdominal small bowel loops. Slight edema noted within the adjacent mesentery. This could reflect enteritis, infectious or ischemic.  The distended fluid-filled stomach and fluid-filled distal esophagus.  Colonic diverticulosis.  No active diverticulitis.  Aortic atherosclerosis.  Prostate enlargement. ____________________________________________   DIFFERENTIAL DIAGNOSIS   Small bowel obstruction, constipation, dehydration, electrolyte abnormality, cholecystitis  FINAL ASSESSMENT AND PLAN  Abdominal pain, vomiting   Plan: The patient had presented for abdominal pain and vomiting. Patient's labs did indicate leukocytosis. Patient's imaging revealed prominence of the small bowel loops and gastric distention.  We attempted fluids and antiemetics including Reglan without significant improvement in his symptoms.  I will discuss with the hospitalist for observation.   Ulice Dash, MD    Note: This note was generated in part or whole with voice recognition software. Voice recognition is usually quite accurate but there are transcription errors that can and very often do occur. I apologize for any typographical errors that were not detected and corrected.     Emily Filbert, MD 05/28/19 [phone]

## 2019-05-28 NOTE — ED Triage Notes (Signed)
Pt arrives to ED via POV from home with c/o emesis since 9pm. Pt denies abdominal pain; no fever, CP or SHOB. Pt reports "20" episodes of emesis since 9pm last night. Pt reports h/x pf same in 2019 with d/x of upper bowel obstruction. Pt is A&O, in NAD; RR even, regular, and unlabored.

## 2019-05-28 NOTE — ED Notes (Signed)
Provided pt w/ water for PO challenge 

## 2019-05-28 NOTE — ED Notes (Signed)
This Rn at bedside. Pt oxygen sats 89%. Pt sleeping. Pt woken up with improvement with no improvement to sats. Pt placed on 2L [place] at this time. Pt RN, Marthann Schiller made aware.

## 2019-05-28 NOTE — ED Notes (Signed)
Provided pt w/ graham crackers and peanut butter w/ ginger ale to assess pt's tolerance w/ eating.

## 2019-05-28 NOTE — ED Notes (Signed)
Called pt's daughter and told her pt was roomed in ED17.

## 2019-05-29 DIAGNOSIS — A084 Viral intestinal infection, unspecified: Secondary | ICD-10-CM | POA: Diagnosis not present

## 2019-05-29 DIAGNOSIS — R112 Nausea with vomiting, unspecified: Secondary | ICD-10-CM | POA: Diagnosis not present

## 2019-05-29 LAB — BASIC METABOLIC PANEL
Anion gap: 5 (ref 5–15)
BUN: 17 mg/dL (ref 8–23)
CO2: 28 mmol/L (ref 22–32)
Calcium: 8.2 mg/dL — ABNORMAL LOW (ref 8.9–10.3)
Chloride: 106 mmol/L (ref 98–111)
Creatinine, Ser: 0.89 mg/dL (ref 0.61–1.24)
GFR calc Af Amer: >60 mL/min (ref >60)
GFR calc non Af Amer: >60 mL/min (ref >60)
Glucose, Bld: 114 mg/dL — ABNORMAL HIGH (ref 70–99)
Potassium: 3.8 mmol/L (ref 3.5–5.1)
Sodium: 139 mmol/L (ref 135–145)

## 2019-05-29 LAB — CBC
HCT: 43.7 % (ref 39.0–52.0)
Hemoglobin: 14.8 g/dL (ref 13.0–17.0)
MCH: 31.8 pg (ref 26.0–34.0)
MCHC: 33.9 g/dL (ref 30.0–36.0)
MCV: 94 fL (ref 80.0–100.0)
Platelets: 187 10*3/uL (ref 150–400)
RBC: 4.65 MIL/uL (ref 4.22–5.81)
RDW: 12.6 % (ref 11.5–15.5)
WBC: 8.7 10*3/uL (ref 4.0–10.5)
nRBC: 0 % (ref 0.0–0.2)

## 2019-05-29 NOTE — Care Management Obs Status (Signed)
MEDICARE OBSERVATION STATUS NOTIFICATION   Patient Details  Name: Kyle Wells MRN: [id number] Date of Birth: 1933-02-11   Medicare Observation Status Notification Given:  Yes    Maud Deed, LCSW 05/29/2019, 10:40 AM

## 2019-05-29 NOTE — Progress Notes (Signed)
Patient adequate for discharge with daughter Annice Pih. No complaints or questions at this time.

## 2019-05-29 NOTE — Discharge Summary (Signed)
Physician Discharge Summary  Kyle Wells OVF:[id number] DOB: 1933/05/16 DOA: 05/28/2019  PCP: Kirk Ruths, MD  Admit date: 05/28/2019 Discharge date: 05/29/2019  Recommendations for Outpatient Follow-up:  1. Routine care  Follow-up Information    Kirk Ruths, MD Follow up.   Specialty: Internal Medicine Why: as needed Contact information: [place] [address] [phone]            Discharge Diagnoses: Principal diagnosis is #1 1. Intractable nausea and vomiting secondary to gastroenteritis, presumably bilaterally  Discharge Condition: improved Disposition: home  Diet recommendation: regular  Filed Weights   05/28/19 0553  Weight: 95.3 kg    History of present illness:  86 year old man PMH medically managed SBO, presented with intractable nausea and vomiting.  CT abdomen and pelvis showed prominent small bowel loops which could reflect enteritis, infection or ischemia.  Admitted for further evaluation.  Hospital Course:  Observed overnight, seen by general surgery, treated conservatively and diet advanced.  Patient tolerating diet, symptoms resolved.  Hospitalization was uncomplicated.    Intractable nausea and vomiting secondary to gastroenteritis.  CT abdomen pelvis with prominent small bowel loops, differential includes enteritis, infection or ischemia.  Esophagus and stomach were distended and fluid-filled.   --Seen by general surgery, impression was gastroenteritis.  Small bowel obstruction thought unlikely.  Recommendation for n.p.o., advance diet as tolerated.  Surgery signed off same day.  Discussed with daughter by telephone with patient's permission  Today's assessment: S: Feels a lot better today. Vomiting and nausea resolved.  Tolerating liquids.  No abdominal pain.  Had a bowel movement. O: Vitals:  Vitals:   05/28/19 1948 05/29/19 0839  BP:  100/69  Pulse:  83  Resp:  20    Temp:  98.1 F (36.7 C)  SpO2: 97% 95%    Constitutional:  . Appears calm and comfortable Respiratory:  . CTA bilaterally, no w/r/r.  . Respiratory effort normal.  Cardiovascular:  . RRR, no m/r/g Abdomen:  . Soft, nontender, mildly distended Psychiatric:  . judgement and insight appear normal . Mental status o Mood, affect appropriate  Basic metabolic panel unremarkable CBC unremarkable LFTs unremarkable with trivial elevation of total bilirubin  Discharge Instructions  Discharge Instructions    Activity as tolerated - No restrictions   Complete by: As directed    Diet general   Complete by: As directed    Discharge instructions   Complete by: As directed    Call your physician or seek immediate medical attention for nausea, vomiting, abdominal pain.   Nursing communication   Complete by: As directed    Can d/c home after lunch, if can tolerate lunch     Allergies as of 05/29/2019      Reactions   Zileuton Hives   [Onset: 10/27/2009]   Roflumilast Other (See Comments)   insomnia   [Onset: 11/29/2010]      Medication List    TAKE these medications   acetaminophen 500 MG tablet Commonly known as: TYLENOL Take 500 mg by mouth every 6 (six) hours as needed (pain).   Advair Diskus 500-50 MCG/DOSE Aepb Generic drug: Fluticasone-Salmeterol Inhale 1 puff into the lungs 2 (two) times daily.   albuterol 108 (90 Base) MCG/ACT inhaler Commonly known as: VENTOLIN HFA Inhale 2 puffs into the lungs every 4 (four) hours as needed for wheezing or shortness of breath.   albuterol (2.5 MG/3ML) 0.083% nebulizer solution Commonly known as: PROVENTIL Inhale 3 mLs into the  lungs every 6 (six) hours as needed for wheezing or shortness of breath.   azelastine 0.1 % nasal spray Commonly known as: ASTELIN Place 2 sprays into both nostrils 2 (two) times daily.   chlorhexidine 0.12 % solution Commonly known as: PERIDEX 15 mLs 2 (two) times daily.   finasteride 5 MG  tablet Commonly known as: PROSCAR Take 1 tablet (5 mg total) by mouth daily.   fluticasone 50 MCG/ACT nasal spray Commonly known as: FLONASE Place 1 spray into both nostrils 2 (two) times daily as needed for allergies or rhinitis.   ibuprofen 400 MG tablet Commonly known as: ADVIL Take 400 mg by mouth 3 (three) times daily.   montelukast 10 MG tablet Commonly known as: SINGULAIR Take 1 tablet by mouth at bedtime.   multivitamin capsule Take 1 capsule by mouth daily.   polyethylene glycol 17 g packet Commonly known as: MIRALAX / GLYCOLAX Take 17 g by mouth daily.   sennosides-docusate sodium 8.6-50 MG tablet Commonly known as: SENOKOT-S Take 1 tablet by mouth at bedtime.   Spiriva HandiHaler 18 MCG inhalation capsule Generic drug: tiotropium Place 18 mcg into inhaler and inhale daily.      Allergies  Allergen Reactions  . Zileuton Hives    [Onset: 10/27/2009]    . Roflumilast Other (See Comments)    insomnia   [Onset: 11/29/2010]     The results of significant diagnostics from this hospitalization (including imaging, microbiology, ancillary and laboratory) are listed below for reference.    Significant Diagnostic Studies: CT ABDOMEN PELVIS W CONTRAST  Result Date: 05/28/2019 CLINICAL DATA:  Bowel obstruction suspected.  Vomiting. EXAM: CT ABDOMEN AND PELVIS WITH CONTRAST TECHNIQUE: Multidetector CT imaging of the abdomen and pelvis was performed using the standard protocol following bolus administration of intravenous contrast. CONTRAST:  OMNIPAQUE IOHEXOL 300 MG/ML  SOLN COMPARISON:  05/06/2017 FINDINGS: Lower chest: Mild cardiomegaly. Areas of bibasilar scarring or atelectasis. Small hiatal hernia. No effusions. Hepatobiliary: No focal hepatic abnormality. Gallbladder unremarkable. Pancreas: No focal abnormality or ductal dilatation. Spleen: No focal abnormality.  Normal size. Adrenals/Urinary Tract: No adrenal abnormality. No focal renal abnormality. No stones  or hydronephrosis. Urinary bladder is unremarkable. Stomach/Bowel: Colonic diverticulosis, most pronounced in the sigmoid colon. No active diverticulitis.Stomach and distal esophagus are fluid-filled. Proximal small bowel is decompressed. There are mid small bowel loops which are mildly dilated with associated mild mesenteric edema. Distal small bowel loops are also decompressed. Vascular/Lymphatic: Aortic atherosclerosis. No evidence of aneurysm or adenopathy. Reproductive: Prostate enlargement. Other: No free fluid or free air. Musculoskeletal: No acute bony abnormality. Chronic compression fracture at L1, stable. IMPRESSION: Mild prominence of mid abdominal small bowel loops. Slight edema noted within the adjacent mesentery. This could reflect enteritis, infectious or ischemic. The distended fluid-filled stomach and fluid-filled distal esophagus. Colonic diverticulosis.  No active diverticulitis. Aortic atherosclerosis. Prostate enlargement. Electronically Signed   By: Charlett Nose M.D.   On: 05/28/2019 08:48   US Venous Img Lower Unilateral Right (DVT)  Result Date: 05/17/2019 CLINICAL DATA:  Right leg swelling for the past 2 days. EXAM: RIGHT LOWER EXTREMITY VENOUS DOPPLER ULTRASOUND TECHNIQUE: Gray-scale sonography with compression, as well as color and duplex ultrasound, were performed to evaluate the deep venous system(s) from the level of the common femoral vein through the popliteal and proximal calf veins. COMPARISON:  None. FINDINGS: VENOUS Normal compressibility of the common femoral, superficial femoral, and popliteal veins, as well as the visualized calf veins. Visualized portions of profunda femoral vein and great  saphenous vein unremarkable. No filling defects to suggest DVT on grayscale or color Doppler imaging. Doppler waveforms show normal direction of venous flow, normal respiratory phasicity and response to augmentation. Limited views of the contralateral common femoral vein are  unremarkable. OTHER None. Limitations: none IMPRESSION: No femoropopliteal DVT nor evidence of DVT within the visualized calf veins. If clinical symptoms are inconsistent or if there are persistent or worsening symptoms, further imaging (possibly involving the iliac veins) may be warranted. Electronically Signed   By: Beckie Salts M.D.   On: 05/17/2019 17:29    Microbiology: Recent Results (from the past 240 hour(s))  Respiratory Panel by RT PCR (Flu A&B, Covid) - Nasopharyngeal Swab     Status: None   Collection Time: 05/28/19 12:34 PM   Specimen: Nasopharyngeal Swab  Result Value Ref Range Status   SARS Coronavirus 2 by RT PCR NEGATIVE NEGATIVE Final    Comment: (NOTE) SARS-CoV-2 target nucleic acids are NOT DETECTED. The SARS-CoV-2 RNA is generally detectable in upper respiratoy specimens during the acute phase of infection. The lowest concentration of SARS-CoV-2 viral copies this assay can detect is 131 copies/mL. A negative result does not preclude SARS-Cov-2 infection and should not be used as the sole basis for treatment or other patient management decisions. A negative result may occur with  improper specimen collection/handling, submission of specimen other than nasopharyngeal swab, presence of viral mutation(s) within the areas targeted by this assay, and inadequate number of viral copies (<131 copies/mL). A negative result must be combined with clinical observations, patient history, and epidemiological information. The expected result is Negative. Fact Sheet for Patients:  https://www.moore.com/ Fact Sheet for Healthcare Providers:  https://www.young.biz/ This test is not yet ap proved or cleared by the [place] FDA and  has been authorized for detection and/or diagnosis of SARS-CoV-2 by FDA under an Emergency Use Authorization (EUA). This EUA will remain  in effect (meaning this test can be used) for the duration of the COVID-19  declaration under Section 564(b)(1) of the Act, 21 U.S.C. section 360bbb-3(b)(1), unless the authorization is terminated or revoked sooner.    Influenza A by PCR NEGATIVE NEGATIVE Final   Influenza B by PCR NEGATIVE NEGATIVE Final    Comment: (NOTE) The Xpert Xpress SARS-CoV-2/FLU/RSV assay is intended as an aid in  the diagnosis of influenza from Nasopharyngeal swab specimens and  should not be used as a sole basis for treatment. Nasal washings and  aspirates are unacceptable for Xpert Xpress SARS-CoV-2/FLU/RSV  testing. Fact Sheet for Patients: https://www.moore.com/ Fact Sheet for Healthcare Providers: https://www.young.biz/ This test is not yet approved or cleared by the [place] FDA and  has been authorized for detection and/or diagnosis of SARS-CoV-2 by  FDA under an Emergency Use Authorization (EUA). This EUA will remain  in effect (meaning this test can be used) for the duration of the  Covid-19 declaration under Section 564(b)(1) of the Act, 21  U.S.C. section 360bbb-3(b)(1), unless the authorization is  terminated or revoked. Performed at [facility], [address] [place], [place] [address]      Labs: Basic Metabolic Panel: Recent Labs  Lab 05/28/19 0603 05/29/19 0456  NA 138 139  K 4.0 3.8  CL 102 106  CO2 26 28  GLUCOSE 219* 114*  BUN 20 17  CREATININE 0.94 0.89  CALCIUM 9.1 8.2*   Liver Function Tests: Recent Labs  Lab 05/28/19 0603  AST 23  ALT 24  ALKPHOS 53  BILITOT 1.4*  PROT 7.3  ALBUMIN 3.9   Recent Labs  Lab 05/28/19 0603  LIPASE 17   CBC: Recent Labs  Lab 05/28/19 0603 05/29/19 0456  WBC 14.5* 8.7  HGB 15.6 14.8  HCT 46.5 43.7  MCV 93.6 94.0  PLT 220 187    Principal Problem:   Intractable nausea and vomiting Active Problems:   Chronic obstructive pulmonary disease (HCC)   BPH (benign prostatic hyperplasia)   Time coordinating discharge: 35  minutes  Signed:  Brendia Sacks, MD  [facility]  05/29/2019, 12:46 PM

## 2019-05-29 NOTE — Care Management CC44 (Signed)
Condition Code 44 Documentation Completed  Patient Details  Name: Kyle Wells MRN: [id number] Date of Birth: 20-Mar-1933   Condition Code 44 given:  Yes Patient signature on Condition Code 44 notice:  Yes Documentation of 2 MD's agreement:  Yes Code 44 added to claim:  Yes    Maud Deed, LCSW 05/29/2019, 10:40 AM

## 2019-05-31 MED ORDER — LACTATED RINGERS IV SOLN
100.00 | INTRAVENOUS | Status: DC
Start: ? — End: 2019-05-31

## 2019-05-31 MED ORDER — ENOXAPARIN SODIUM 40 MG/0.4ML ~~LOC~~ SOLN
40.00 | SUBCUTANEOUS | Status: DC
Start: 2019-05-31 — End: 2019-05-31

## 2019-05-31 MED ORDER — FLUTICASONE FUROATE-VILANTEROL 200-25 MCG/INH IN AEPB
1.00 | INHALATION_SPRAY | RESPIRATORY_TRACT | Status: DC
Start: 2019-06-01 — End: 2019-05-31

## 2019-05-31 MED ORDER — BISACODYL 10 MG RE SUPP
10.00 | RECTAL | Status: DC
Start: 2019-05-31 — End: 2019-05-31

## 2019-05-31 MED ORDER — IPRATROPIUM-ALBUTEROL 0.5-2.5 (3) MG/3ML IN SOLN
3.00 | RESPIRATORY_TRACT | Status: DC
Start: 2019-05-31 — End: 2019-05-31

## 2019-05-31 MED ORDER — UMECLIDINIUM BROMIDE 62.5 MCG/INH IN AEPB
1.00 | INHALATION_SPRAY | RESPIRATORY_TRACT | Status: DC
Start: 2019-06-01 — End: 2019-05-31

## 2019-05-31 MED ORDER — GENERIC EXTERNAL MEDICATION
Status: DC
Start: ? — End: 2019-05-31

## 2019-06-09 ENCOUNTER — Other Ambulatory Visit
Admission: RE | Admit: 2019-06-09 | Discharge: 2019-06-09 | Disposition: A | Discharge status: Home / Self Care | Payer: Medicare PPO | Source: Ambulatory Visit | Attending: Internal Medicine | Admitting: Internal Medicine

## 2019-06-09 DIAGNOSIS — R0602 Shortness of breath: Secondary | ICD-10-CM | POA: Insufficient documentation

## 2019-06-09 DIAGNOSIS — Z8719 Personal history of other diseases of the digestive system: Secondary | ICD-10-CM | POA: Diagnosis present

## 2019-06-09 LAB — FIBRIN DERIVATIVES D-DIMER (ARMC ONLY): Fibrin derivatives D-dimer (ARMC): 2257.16 ng/mL (FEU) — ABNORMAL HIGH (ref 0.00–499.00)

## 2019-06-10 ENCOUNTER — Other Ambulatory Visit: Payer: Self-pay | Admitting: Internal Medicine

## 2019-06-10 ENCOUNTER — Ambulatory Visit
Admission: RE | Admit: 2019-06-10 | Discharge: 2019-06-10 | Disposition: A | Discharge status: Home / Self Care | Payer: Medicare PPO | Source: Ambulatory Visit | Attending: Internal Medicine | Admitting: Internal Medicine

## 2019-06-10 ENCOUNTER — Other Ambulatory Visit: Payer: Self-pay

## 2019-06-10 DIAGNOSIS — R7989 Other specified abnormal findings of blood chemistry: Secondary | ICD-10-CM

## 2019-06-10 MED ORDER — IOHEXOL 350 MG/ML SOLN
75.0000 mL | Freq: Once | INTRAVENOUS | Status: AC | PRN
  Administered 2019-06-10: 75 mL via INTRAVENOUS

## 2019-06-14 DIAGNOSIS — I2694 Multiple subsegmental pulmonary emboli without acute cor pulmonale: Secondary | ICD-10-CM | POA: Insufficient documentation

## 2019-06-14 DIAGNOSIS — Z86711 Personal history of pulmonary embolism: Secondary | ICD-10-CM | POA: Insufficient documentation

## 2019-06-18 ENCOUNTER — Other Ambulatory Visit: Payer: Self-pay | Admitting: Student

## 2019-06-18 DIAGNOSIS — Z8719 Personal history of other diseases of the digestive system: Secondary | ICD-10-CM

## 2019-06-24 ENCOUNTER — Other Ambulatory Visit: Payer: Medicare PPO

## 2019-07-01 ENCOUNTER — Other Ambulatory Visit: Payer: Medicare PPO

## 2019-07-05 ENCOUNTER — Inpatient Hospital Stay: Admit: 2019-07-05 | Payer: Medicare PPO | Admitting: Orthopedic Surgery

## 2019-07-05 SURGERY — ARTHROPLASTY, KNEE, TOTAL, USING IMAGELESS COMPUTER-ASSISTED NAVIGATION
Anesthesia: Choice | Site: Knee | Laterality: Right

## 2019-10-21 ENCOUNTER — Other Ambulatory Visit: Payer: Self-pay

## 2019-10-21 ENCOUNTER — Ambulatory Visit (INDEPENDENT_AMBULATORY_CARE_PROVIDER_SITE_OTHER): Payer: Medicare PPO | Admitting: Vascular Surgery

## 2019-10-21 ENCOUNTER — Encounter (INDEPENDENT_AMBULATORY_CARE_PROVIDER_SITE_OTHER): Payer: Self-pay | Admitting: Vascular Surgery

## 2019-10-21 VITALS — BP 145/80 | HR 76 | Resp 16 | Ht 66.0 in | Wt 207.8 lb

## 2019-10-21 DIAGNOSIS — I2694 Multiple subsegmental pulmonary emboli without acute cor pulmonale: Secondary | ICD-10-CM | POA: Diagnosis not present

## 2019-10-21 DIAGNOSIS — M170 Bilateral primary osteoarthritis of knee: Secondary | ICD-10-CM | POA: Diagnosis not present

## 2019-10-21 DIAGNOSIS — J449 Chronic obstructive pulmonary disease, unspecified: Secondary | ICD-10-CM

## 2019-10-21 DIAGNOSIS — E7800 Pure hypercholesterolemia, unspecified: Secondary | ICD-10-CM

## 2019-10-29 ENCOUNTER — Encounter (INDEPENDENT_AMBULATORY_CARE_PROVIDER_SITE_OTHER): Payer: Self-pay | Admitting: Vascular Surgery

## 2019-10-29 NOTE — Progress Notes (Signed)
MRN : [id number]  Kyle Wells is a 86 y.o. (05-09-1933) male who presents with chief complaint of  Chief Complaint  Patient presents with   New Patient (Initial Visit)    ref [facility] discuss ivc filter placement  .  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  PE was identified in May of 2021 and is being treated with anticoagulation.  The presenting symptoms were pain and shortness of breath.  The patient notes the leg continues to be mildly painful with dependency and still swells quite a bite.  Symptoms are much better with elevation.  The patient notes minimal edema in the morning which steadily worsens throughout the day.    The patient has not been using compression therapy at this point.  No SOB or pleuritic chest pains.  No cough or hemoptysis.  No blood per rectum or blood in any sputum.  No excessive bruising per the patient.     Current Meds  Medication Sig   acetaminophen (TYLENOL) 500 MG tablet Take 500 mg by mouth every 6 (six) hours as needed (pain).    ADVAIR DISKUS 500-50 MCG/DOSE AEPB Inhale 1 puff into the lungs 2 (two) times daily.    albuterol (PROVENTIL HFA;VENTOLIN HFA) 108 (90 Base) MCG/ACT inhaler Inhale 2 puffs into the lungs every 4 (four) hours as needed for wheezing or shortness of breath.   albuterol (PROVENTIL) (2.5 MG/3ML) 0.083% nebulizer solution Inhale 3 mLs into the lungs every 6 (six) hours as needed for wheezing or shortness of breath.    azelastine (ASTELIN) 0.1 % nasal spray Place 2 sprays into both nostrils 2 (two) times daily.   chlorhexidine (PERIDEX) 0.12 % solution 15 mLs 2 (two) times daily.   finasteride (PROSCAR) 5 MG tablet Take 1 tablet (5 mg total) by mouth daily.   fluticasone (FLONASE) 50 MCG/ACT nasal spray Place 1 spray into both nostrils 2 (two) times daily as needed for allergies or rhinitis.     montelukast (SINGULAIR) 10 MG tablet Take 1 tablet by mouth at bedtime.    Multiple Vitamin (MULTIVITAMIN) capsule Take 1 capsule by mouth daily.   polyethylene glycol (MIRALAX / GLYCOLAX) packet Take 17 g by mouth daily.   rivaroxaban (XARELTO) 20 MG TABS tablet Take 1 tablet by mouth daily with breakfast.   sennosides-docusate sodium (SENOKOT-S) 8.6-50 MG tablet Take 1 tablet by mouth at bedtime.   SPIRIVA HANDIHALER 18 MCG inhalation capsule Place 18 mcg into inhaler and inhale daily.     Past Medical History:  Diagnosis Date   Arthritis    Asthma    BPH (benign prostatic hyperplasia)    COPD (chronic obstructive pulmonary disease) (HCC)    Hypercholesterolemia    Impingement syndrome of right shoulder    OSA (obstructive sleep apnea)    Small bowel obstruction (HCC)     Past Surgical History:  Procedure Laterality Date   COLONOSCOPY     PROSTATE BIOPSY      Social History Social History   Tobacco Use   Smoking status: Current Some Day Smoker    Packs/day: 0.10    Types: Cigarettes   Smokeless tobacco: Never Used  Vaping Use   Vaping Use: Never used  Substance Use Topics   Alcohol use: No   Drug use: No    Family History Family History  Problem Relation Age of Onset   Asthma Father  Heart attack Father   No family history of bleeding/clotting disorders, porphyria or autoimmune disease   Allergies  Allergen Reactions   Zileuton Hives    [Onset: 10/27/2009]     Roflumilast Other (See Comments)    insomnia   [Onset: 11/29/2010]  Other reaction(s): Other (See Comments) Insomnia     REVIEW OF SYSTEMS (Negative unless checked)  Constitutional: [] Weight loss  [] Fever  [] Chills Cardiac: [] Chest pain   [] Chest pressure   [] Palpitations   [] Shortness of breath when laying flat   [] Shortness of breath with exertion. Vascular:  [] Pain in legs with walking   [] Pain in legs at rest  [x] History of DVT   [] Phlebitis   [x] Swelling in legs    [] Varicose veins   [] Non-healing ulcers Pulmonary:   [] Uses home oxygen   [] Productive cough   [] Hemoptysis   [] Wheeze  [] COPD   [] Asthma Neurologic:  [] Dizziness   [] Seizures   [] History of stroke   [] History of TIA  [] Aphasia   [] Vissual changes   [] Weakness or numbness in arm   [] Weakness or numbness in leg Musculoskeletal:   [] Joint swelling   [x] Joint pain   [] Low back pain Hematologic:  [] Easy bruising  [] Easy bleeding   [] Hypercoagulable state   [] Anemic Gastrointestinal:  [] Diarrhea   [] Vomiting  [] Gastroesophageal reflux/heartburn   [] Difficulty swallowing. Genitourinary:  [] Chronic kidney disease   [] Difficult urination  [] Frequent urination   [] Blood in urine Skin:  [] Rashes   [] Ulcers  Psychological:  [] History of anxiety   []  History of major depression.  Physical Examination  Vitals:   10/21/19 0943  BP: (!) 145/80  Pulse: 76  Resp: 16  Weight: 207 lb 12.8 oz (94.3 kg)  Height: 5\' 6"  (1.676 m)   Body mass index is 33.54 kg/m. Gen: WD/WN, NAD Head: [place]/AT, No temporalis wasting.  Ear/Nose/Throat: Hearing grossly intact, nares w/o erythema or drainage, poor dentition Eyes: PER, EOMI, sclera nonicteric.  Neck: Supple, no masses.  No bruit or JVD.  Pulmonary:  Good air movement, clear to auscultation bilaterally, no use of accessory muscles.  Cardiac: RRR, normal S1, S2, no Murmurs. Vascular: scattered varicosities present bilaterally.  Mild venous stasis changes to the legs bilaterally.  2+ soft pitting edema Vessel Right Left  Radial Palpable Palpable  PT Palpable Palpable  DP Palpable Palpable  Gastrointestinal: soft, non-distended. No guarding/no peritoneal signs.  Musculoskeletal: M/S 5/5 throughout. Arthritic deformity both knees no atrophy.  Neurologic: CN 2-12 intact. Pain and light touch intact in extremities.  Symmetrical.  Speech is fluent. Motor exam as listed above. Psychiatric: Judgment intact, Mood & affect appropriate for pt's clinical  situation. Dermatologic: No rashes or ulcers noted.  No changes consistent with cellulitis. Lymph : No Cervical lymphadenopathy, no lichenification or skin changes of chronic lymphedema.  CBC Lab Results  Component Value Date   WBC 8.7 05/29/2019   HGB 14.8 05/29/2019   HCT 43.7 05/29/2019   MCV 94.0 05/29/2019   PLT 187 05/29/2019    BMET    Component Value Date/Time   NA 139 05/29/2019 0456   NA 135 (L) 08/02/2013 0315   K 3.8 05/29/2019 0456   K 4.0 08/02/2013 0315   CL 106 05/29/2019 0456   CL 100 08/02/2013 0315   CO2 28 05/29/2019 0456   CO2 27 08/02/2013 0315   GLUCOSE 114 (H) 05/29/2019 0456   GLUCOSE 123 (H) 08/02/2013 0315   BUN 17 05/29/2019 0456   BUN 19 (H) 08/02/2013 0315   CREATININE 0.89  05/29/2019 0456   CREATININE 1.08 08/02/2013 0315   CALCIUM 8.2 (L) 05/29/2019 0456   CALCIUM 8.7 08/02/2013 0315   GFRNONAA >60 05/29/2019 0456   GFRNONAA >60 08/02/2013 0315   GFRAA >60 05/29/2019 0456   GFRAA >60 08/02/2013 0315   CrCl cannot be calculated (Patient's most recent lab result is older than the maximum 21 days allowed.).  COAG Lab Results  Component Value Date   INR 0.9 04/13/2018   INR 0.97 01/23/2018    Radiology No results found.    Assessment/Plan 1. Multiple subsegmental pulmonary emboli without acute cor pulmonale (HCC) The patient will continue anticoagulation for now as there have not been any problems or complications at this point.  IVC filter is strongly indicated prior to high risk orthopedic surgery.  Especially given the history of PE with the past DVT.  IVC filter placement will be done the week for surgery. Risk and benefits were reviewed the patient.  Indications for the procedure were reviewed.  All questions were answered, the patient agrees to proceed.   I have had a long discussion with the patient regarding DVT and post phlebitic changes such as swelling and why it  causes symptoms such as pain.  The patient will wear  graduated compression stockings class 1 (20-30 mmHg) on a daily basis a prescription was given. The patient will  beginning wearing the stockings first thing in the morning and removing them in the evening. The patient is instructed specifically not to sleep in the stockings.  In addition, behavioral modification including elevation during the day and avoidance of prolonged dependency will be initiated.    The patient will follow-up with me in 3 months after the joint replacement surgery to discuss removal (this was also discussed today and the patient agrees with the plan to have the filter removed).    2. Chronic obstructive pulmonary disease, unspecified COPD type (HCC) Continue pulmonary medications and aerosols as already ordered, these medications have been reviewed and there are no changes at this time.    3. Primary osteoarthritis of both knees Continue NSAID medications as already ordered, these medications have been reviewed and there are no changes at this time.  Continued activity and therapy was stressed.   4. Hypercholesteremia Continue statin as ordered and reviewed, no changes at this time    [name], MD  10/29/2019 5:11 PM

## 2019-11-10 ENCOUNTER — Telehealth (INDEPENDENT_AMBULATORY_CARE_PROVIDER_SITE_OTHER): Payer: Self-pay

## 2019-11-10 NOTE — Telephone Encounter (Signed)
Spoke with the patient and he is scheduled with Dr. Gilda Crease for a IVC filter placement on 11/26/19 with a 11:00 am arrival time to the [facility]. Covid testing on 11/24/19 between 8-1 pm at the [facility]. Pre-procedure instructions were discussed and will be mailed.

## 2019-11-21 NOTE — Discharge Instructions (Signed)
Instructions after Total Knee Replacement   Kyle Wells, Jr., M.D.     Dept. of Orthopaedics & Sports Medicine  [facility]  [address]  [place], [place]  [address]  Phone: [phone]   Fax: [phone]    DIET: Drink plenty of non-alcoholic fluids. Resume your normal diet. Include foods high in fiber.  ACTIVITY:  You may use crutches or a walker with weight-bearing as tolerated, unless instructed otherwise. You may be weaned off of the walker or crutches by your Physical Therapist.  Do NOT place pillows under the knee. Anything placed under the knee could limit your ability to straighten the knee.   Continue doing gentle exercises. Exercising will reduce the pain and swelling, increase motion, and prevent muscle weakness.   Please continue to use the TED compression stockings for 6 weeks. You may remove the stockings at night, but should reapply them in the morning. Do not drive or operate any equipment until instructed.  WOUND CARE:  Continue to use the PolarCare or ice packs periodically to reduce pain and swelling. You may bathe or shower after the staples are removed at the first office visit following surgery.  MEDICATIONS: You may resume your regular medications. Please take the pain medication as prescribed on the medication. Do not take pain medication on an empty stomach. You have been given a prescription for a blood thinner (Lovenox or Coumadin). Please take the medication as instructed. (NOTE: After completing a 2 week course of Lovenox, take one Enteric-coated aspirin once a day. This along with elevation will help reduce the possibility of phlebitis in your operated leg.) Do not drive or drink alcoholic beverages when taking pain medications.  CALL THE OFFICE FOR: Temperature above 101 degrees Excessive bleeding or drainage on the dressing. Excessive swelling, coldness, or paleness of the toes. Persistent nausea and vomiting.  FOLLOW-UP:  You  should have an appointment to return to the office in 10-14 days after surgery. Arrangements have been made for continuation of Physical Therapy (either home therapy or outpatient therapy).   [facility] Department Directory         [email or web address]       [email or web address]          Cardiology  Appointments: [place] - [phone] [place] - [phone]  Endocrinology  Appointments: [place] - [phone] [place] - [phone]  Gastroenterology  Appointments: [place] - [phone] [place] - [phone]        General Surgery   Appointments: [place] - [phone]  Internal Medicine/Family Medicine  Appointments: [place] - [phone] [place] - [phone] [place] - [phone]  Metabolic and Weigh Loss Surgery  Appointments: [place] - [phone]        Neurology  Appointments: [place] - [phone] [place] - [phone]  Neurosurgery  Appointments: [place] - [phone]  Obstetrics & Gynecology  Appointments: [place] - [phone] [place] - [phone]        Pediatrics  Appointments: [place] - [phone] [place] - [phone]  Physiatry  Appointments: [place] -[phone]  Physical Therapy  Appointments: [place] - [phone] [place] - [phone]        Podiatry  Appointments: [place] - [phone] [place] - [phone]  Pulmonology  Appointments: [place] - [phone]  Rheumatology  Appointments: [place] - [phone]        [place] Location: [facility]  [address] , [place]  [address]  [place] Location: [facility] [address] [place], [place]  [address]  [place] Location: [facility] [address] [place], [place]  [address]    

## 2019-11-24 ENCOUNTER — Other Ambulatory Visit
Admission: RE | Admit: 2019-11-24 | Discharge: 2019-11-24 | Disposition: A | Discharge status: Home / Self Care | Payer: Medicare PPO | Source: Ambulatory Visit | Attending: Vascular Surgery | Admitting: Vascular Surgery

## 2019-11-24 ENCOUNTER — Other Ambulatory Visit: Payer: Self-pay

## 2019-11-24 DIAGNOSIS — Z20822 Contact with and (suspected) exposure to covid-19: Secondary | ICD-10-CM | POA: Diagnosis not present

## 2019-11-24 DIAGNOSIS — Z01818 Encounter for other preprocedural examination: Secondary | ICD-10-CM | POA: Insufficient documentation

## 2019-11-24 LAB — SARS CORONAVIRUS 2 (TAT 6-24 HRS): SARS Coronavirus 2: NEGATIVE

## 2019-11-26 ENCOUNTER — Other Ambulatory Visit (INDEPENDENT_AMBULATORY_CARE_PROVIDER_SITE_OTHER): Payer: Self-pay | Admitting: Nurse Practitioner

## 2019-11-26 ENCOUNTER — Encounter: Payer: Self-pay | Admitting: Vascular Surgery

## 2019-11-26 ENCOUNTER — Other Ambulatory Visit: Payer: Self-pay

## 2019-11-26 ENCOUNTER — Encounter
Admission: RE | Disposition: A | Discharge status: Home / Self Care | Payer: Self-pay | Source: Ambulatory Visit | Attending: Vascular Surgery

## 2019-11-26 ENCOUNTER — Ambulatory Visit
Admission: RE | Admit: 2019-11-26 | Discharge: 2019-11-26 | Disposition: A | Discharge status: Home / Self Care | Payer: Medicare PPO | Source: Ambulatory Visit | Attending: Vascular Surgery | Admitting: Vascular Surgery

## 2019-11-26 DIAGNOSIS — J449 Chronic obstructive pulmonary disease, unspecified: Secondary | ICD-10-CM | POA: Insufficient documentation

## 2019-11-26 DIAGNOSIS — M170 Bilateral primary osteoarthritis of knee: Secondary | ICD-10-CM | POA: Insufficient documentation

## 2019-11-26 DIAGNOSIS — Z86718 Personal history of other venous thrombosis and embolism: Secondary | ICD-10-CM | POA: Insufficient documentation

## 2019-11-26 DIAGNOSIS — F17210 Nicotine dependence, cigarettes, uncomplicated: Secondary | ICD-10-CM | POA: Diagnosis not present

## 2019-11-26 DIAGNOSIS — E7800 Pure hypercholesterolemia, unspecified: Secondary | ICD-10-CM | POA: Insufficient documentation

## 2019-11-26 DIAGNOSIS — I2694 Multiple subsegmental pulmonary emboli without acute cor pulmonale: Secondary | ICD-10-CM | POA: Insufficient documentation

## 2019-11-26 DIAGNOSIS — Z86711 Personal history of pulmonary embolism: Secondary | ICD-10-CM | POA: Diagnosis not present

## 2019-11-26 HISTORY — PX: IVC FILTER INSERTION: CATH118245

## 2019-11-26 SURGERY — IVC FILTER INSERTION
Anesthesia: Moderate Sedation

## 2019-11-26 MED ORDER — METHYLPREDNISOLONE SODIUM SUCC 125 MG IJ SOLR: 125.0000 mg | Freq: Once | INTRAMUSCULAR | Status: DC | PRN

## 2019-11-26 MED ORDER — CEFAZOLIN SODIUM-DEXTROSE 2-4 GM/100ML-% IV SOLN
INTRAVENOUS | Status: AC
  Administered 2019-11-26: 2 g via INTRAVENOUS
  Filled 2019-11-26: qty 100

## 2019-11-26 MED ORDER — FENTANYL CITRATE (PF) 100 MCG/2ML IJ SOLN
INTRAMUSCULAR | Status: AC
  Filled 2019-11-26: qty 2

## 2019-11-26 MED ORDER — DIPHENHYDRAMINE HCL 50 MG/ML IJ SOLN: 50.0000 mg | Freq: Once | INTRAMUSCULAR | Status: DC | PRN

## 2019-11-26 MED ORDER — HYDROMORPHONE HCL 1 MG/ML IJ SOLN: 1.0000 mg | Freq: Once | INTRAMUSCULAR | Status: DC | PRN

## 2019-11-26 MED ORDER — ONDANSETRON HCL 4 MG/2ML IJ SOLN: 4.0000 mg | Freq: Four times a day (QID) | INTRAMUSCULAR | Status: DC | PRN

## 2019-11-26 MED ORDER — FAMOTIDINE 20 MG PO TABS: 40.0000 mg | ORAL_TABLET | Freq: Once | ORAL | Status: DC | PRN

## 2019-11-26 MED ORDER — MIDAZOLAM HCL 2 MG/ML PO SYRP: 8.0000 mg | ORAL_SOLUTION | Freq: Once | ORAL | Status: DC | PRN

## 2019-11-26 MED ORDER — FENTANYL CITRATE (PF) 100 MCG/2ML IJ SOLN
INTRAMUSCULAR | Status: DC | PRN
  Administered 2019-11-26: 50 ug via INTRAVENOUS

## 2019-11-26 MED ORDER — MIDAZOLAM HCL 2 MG/2ML IJ SOLN
INTRAMUSCULAR | Status: DC | PRN
  Administered 2019-11-26: 2 mg via INTRAVENOUS

## 2019-11-26 MED ORDER — CEFAZOLIN SODIUM-DEXTROSE 2-4 GM/100ML-% IV SOLN: 2.0000 g | Freq: Once | INTRAVENOUS | Status: AC

## 2019-11-26 MED ORDER — MIDAZOLAM HCL 5 MG/5ML IJ SOLN
INTRAMUSCULAR | Status: AC
  Filled 2019-11-26: qty 5

## 2019-11-26 MED ORDER — IODIXANOL 320 MG/ML IV SOLN
INTRAVENOUS | Status: DC | PRN
  Administered 2019-11-26 (×2): 10 mL

## 2019-11-26 MED ORDER — SODIUM CHLORIDE 0.9 % IV SOLN: INTRAVENOUS | Status: DC

## 2019-11-26 SURGICAL SUPPLY — 8 items
CATH ANGIO 5F PIGTAIL 65CM (CATHETERS) IMPLANT
KIT FEMORAL DEL DENALI (Miscellaneous) ×2 IMPLANT
NEEDLE ENTRY 21GA 7CM ECHOTIP (NEEDLE) ×2 IMPLANT
PACK ANGIOGRAPHY (CUSTOM PROCEDURE TRAY) ×2 IMPLANT
SET INTRO CAPELLA COAXIAL (SET/KITS/TRAYS/PACK) ×2 IMPLANT
SHEATH BRITE TIP 5FRX11 (SHEATH) ×2 IMPLANT
TUBING CONTRAST HIGH PRESS 72 (TUBING) IMPLANT
WIRE J 3MM .035X145CM (WIRE) ×2 IMPLANT

## 2019-11-26 NOTE — Discharge Instructions (Signed)

## 2019-11-26 NOTE — Progress Notes (Signed)
Patient awake/alert. Daughter at bedside.  Patient receive IVC filter without adverse reaction. To have left knee surgery 11/10.

## 2019-11-26 NOTE — Progress Notes (Signed)
Dr. Gilda Crease spoke to patient and patient's daughter regarding procedure. Verbalize understanding. Per orthro Dr. Ernest Pine patient to restart xarelto, but hold three days prior scheduled knee surgery 11/10.  Patient and patient's daughter aware and verbalize understanding.

## 2019-11-26 NOTE — Interval H&P Note (Deleted)
History and Physical Interval Note:  11/26/2019 12:57 PM  Kyle Wells  has presented today for surgery, with the diagnosis of IVC Filter Placement   DVT   Pt to have Covid test on 11-24-19.  The various methods of treatment have been discussed with the patient and family. After consideration of risks, benefits and other options for treatment, the patient has consented to  Procedure(s): IVC FILTER INSERTION (N/A) as a surgical intervention.  The patient's history has been reviewed, patient examined, no change in status, stable for surgery.  I have reviewed the patient's chart and labs.  Questions were answered to the patient's satisfaction.     [name]

## 2019-11-26 NOTE — Interval H&P Note (Signed)
History and Physical Interval Note:  11/26/2019 1:34 PM  Kyle Wells  has presented today for surgery, with the diagnosis of IVC Filter Placement   DVT   Pt to have Covid test on 11-24-19.  The various methods of treatment have been discussed with the patient and family. After consideration of risks, benefits and other options for treatment, the patient has consented to  Procedure(s): IVC FILTER INSERTION (N/A) as a surgical intervention.  The patient's history has been reviewed, patient examined, no change in status, stable for surgery.  I have reviewed the patient's chart and labs.  Questions were answered to the patient's satisfaction.     [name]

## 2019-11-26 NOTE — Op Note (Signed)
[facility]   OPERATIVE NOTE    PRE-OPERATIVE DIAGNOSIS: DVT with PE; degenerative joint disease requiring left total knee replacement  POST-OPERATIVE DIAGNOSIS: Same  PROCEDURE: 1.   Ultrasound guidance for vascular access to the right common femoral vein 2.   Catheter placement into the inferior vena cava 3.   Inferior venacavogram 4.   Placement of a Denali IVC filter  SURGEON: [name]  ASSISTANT(S): None  ANESTHESIA: Conscious sedation was administered by the interventional radiology RN under my direct supervision. IV Versed plus fentanyl were utilized. Continuous ECG, pulse oximetry and blood pressure was monitored throughout the entire procedure. Conscious sedation was for a total of 11 minutes 42 seconds.  ESTIMATED BLOOD LOSS: minimal  FINDING(S): 1.  Patent IVC  SPECIMEN(S):  none  INDICATIONS:   Kyle Wells is a 86 y.o. y.o. male who presents with history of PE and DVT.  He is scheduled to undergo left total knee replacement.  Inferior vena cava filter is indicated for this reason.  Risks and benefits including filter thrombosis, migration, fracture, bleeding, and infection were all discussed.  We discussed that all IVC filters that we place can be removed if desired from the patient once the need for the filter has passed.    DESCRIPTION: After obtaining full informed written consent, the patient was brought back to the vascular suite. The skin was sterilely prepped and draped in a sterile surgical field was created. Ultrasound was placed in a sterile sleeve. The right groin was echolucent and compressible indicating patency. Image was recorded for the permanent record. The puncture was made under continuous real-time ultrasound guidance.  The right common femoral vein was accessed under direct ultrasound guidance without difficulty with a micropuncture needle. Microwire was then advanced under fluoroscopic guidance without difficulty.  Micro-sheath was then inserted and a J-wire was then placed. The dilator is passed over the wire and the delivery sheath was placed into the inferior vena cava.  Inferior venacavogram was performed. This demonstrated a patent IVC with the level of the renal veins at L2.  The filter was then deployed into the inferior vena cava at the level of L3 just below the renal veins. The delivery sheath was then removed. Pressure was held. Sterile dressings were placed. The patient tolerated the procedure well and was taken to the recovery room in stable condition.  Interpretation: Inferior vena cava was opacified with a bolus ejection contrast.  Is widely patent.  It measures approximately 20 mm in diameter.  Denali filter is deployed in an upright position with good orientation.  COMPLICATIONS: None  CONDITION: Stable  [name]  11/26/2019, 1:26 PM

## 2019-11-26 NOTE — H&P (Deleted)
@LOGO @   MRN :  Kyle Wells is a 86 y.o. (Nov 20, 1933) male who presents with chief complaint of Dr. 08/22/1933 is going to fix my right knee.  History of Present Illness:   The patient presents to [facility] for placement of an IVC filter.  There is a past history of PE in association with DJD requiring joint replacement surgery.  PE was identified in May of 2021 and is being treated with anticoagulation.  The presenting symptoms were pain and shortness of breath.  The patient notes his right knee has significantly worsened and he is now scheduled for a right total knee replacement.  The patient notes the leg continues to be mildly painful with dependency and still swells quite a bite.  Symptoms are much better with elevation.  The patient notes minimal edema in the morning which steadily worsens throughout the day.    The patient has not been using compression therapy at this point.  No SOB or pleuritic chest pains.  No cough or hemoptysis.  No blood per rectum or blood in any sputum.  No excessive bruising per the patient.   Current Meds  Medication Sig  . ADVAIR DISKUS 500-50 MCG/DOSE AEPB Inhale 1 puff into the lungs 2 (two) times daily.   03-27-2002 azelastine (ASTELIN) 0.1 % nasal spray Place 2 sprays into both nostrils daily as needed for allergies.   Marland Kitchen docusate sodium (COLACE) 100 MG capsule Take 100 mg by mouth at bedtime.  Marland Kitchen SPIRIVA HANDIHALER 18 MCG inhalation capsule Place 18 mcg into inhaler and inhale daily as needed (Wheezing).     Past Medical History:  Diagnosis Date  . Arthritis   . Asthma   . BPH (benign prostatic hyperplasia)   . COPD (chronic obstructive pulmonary disease) (HCC)   . Hypercholesterolemia   . Impingement syndrome of right shoulder   . OSA (obstructive sleep apnea)   . Small bowel obstruction [facility])     Past Surgical History:  Procedure Laterality Date  . COLONOSCOPY    . PROSTATE BIOPSY      Social History Social History   Tobacco  Use  . Smoking status: Current Some Day Smoker    Packs/day: 0.10    Types: Cigarettes  . Smokeless tobacco: Never Used  Vaping Use  . Vaping Use: Never used  Substance Use Topics  . Alcohol use: No  . Drug use: No    Family History Family History  Problem Relation Age of Onset  . Asthma Father   . Heart attack Father     Allergies  Allergen Reactions  . Zileuton Hives    [Onset: 10/27/2009]    . Roflumilast Other (See Comments)    insomnia   [Onset: 11/29/2010]  Other reaction(s): Other (See Comments) Insomnia     REVIEW OF SYSTEMS (Negative unless checked)  Constitutional: [] Weight loss  [] Fever  [] Chills Cardiac: [] Chest pain   [] Chest pressure   [] Palpitations   [] Shortness of breath when laying flat   [] Shortness of breath with exertion. Vascular:  [] Pain in legs with walking   [] Pain in legs at rest  [] History of DVT   [] Phlebitis   [x] Swelling in legs   [] Varicose veins   [] Non-healing ulcers Pulmonary:   [] Uses home oxygen   [] Productive cough   [] Hemoptysis   [] Wheeze  [x] COPD   [] Asthma Neurologic:  [] Dizziness   [] Seizures   [] History of stroke   [] History of TIA  [] Aphasia   [] Vissual changes   [] Weakness or  numbness in arm   [] Weakness or numbness in leg Musculoskeletal:   [] Joint swelling   [x] Joint pain   [] Low back pain Hematologic:  [] Easy bruising  [] Easy bleeding   [] Hypercoagulable state   [] Anemic Gastrointestinal:  [] Diarrhea   [] Vomiting  [] Gastroesophageal reflux/heartburn   [] Difficulty swallowing. Genitourinary:  [] Chronic kidney disease   [] Difficult urination  [] Frequent urination   [] Blood in urine Skin:  [] Rashes   [] Ulcers  Psychological:  [] History of anxiety   []  History of major depression.  Physical Examination  Vitals:   11/26/19 1105  BP: 126/81  Pulse: 70  Resp: 20  Temp: 98.2 F (36.8 C)  TempSrc: Oral  SpO2: 94%  Weight: 90.7 kg  Height: 5\' 7"  (1.702 m)   Body mass index is 31.32 kg/m. Gen: WD/WN, NAD Head: [place]/AT,  No temporalis wasting.  Ear/Nose/Throat: Hearing grossly intact, nares w/o erythema or drainage Eyes: PER, EOMI, sclera nonicteric.  Neck: Supple, no large masses.   Pulmonary:  Good air movement, no audible wheezing bilaterally, no use of accessory muscles.  Cardiac: RRR, no JVD Vascular: scattered varicosities present bilaterally.  Mild venous stasis changes to the legs bilaterally.  2-3+ soft pitting edema Vessel Right Left  Radial Palpable Palpable  PT Palpable Palpable  DP Palpable Palpable  Gastrointestinal: Non-distended. No guarding/no peritoneal signs.  Musculoskeletal: M/S 5/5 throughout.  No deformity or atrophy.  Neurologic: CN 2-12 intact. Symmetrical.  Speech is fluent. Motor exam as listed above. Psychiatric: Judgment intact, Mood & affect appropriate for pt's clinical situation. Dermatologic: Mild venous rashes no ulcers noted.  No changes consistent with cellulitis. Lymph : No lichenification or skin changes of chronic lymphedema.  CBC Lab Results  Component Value Date   WBC 8.7 05/29/2019   HGB 14.8 05/29/2019   HCT 43.7 05/29/2019   MCV 94.0 05/29/2019   PLT 187 05/29/2019    BMET    Component Value Date/Time   NA 139 05/29/2019 0456   NA 135 (L) 08/02/2013 0315   K 3.8 05/29/2019 0456   K 4.0 08/02/2013 0315   CL 106 05/29/2019 0456   CL 100 08/02/2013 0315   CO2 28 05/29/2019 0456   CO2 27 08/02/2013 0315   GLUCOSE 114 (H) 05/29/2019 0456   GLUCOSE 123 (H) 08/02/2013 0315   BUN 17 05/29/2019 0456   BUN 19 (H) 08/02/2013 0315   CREATININE 0.89 05/29/2019 0456   CREATININE 1.08 08/02/2013 0315   CALCIUM 8.2 (L) 05/29/2019 0456   CALCIUM 8.7 08/02/2013 0315   GFRNONAA >60 05/29/2019 0456   GFRNONAA >60 08/02/2013 0315   GFRAA >60 05/29/2019 0456   GFRAA >60 08/02/2013 0315   CrCl cannot be calculated (Patient's most recent lab result is older than the maximum 21 days allowed.).  COAG Lab Results  Component Value Date   INR 0.9 04/13/2018    INR 0.97 01/23/2018    Radiology No results found.   Assessment/Plan 1. Multiple subsegmental pulmonary emboli without acute cor pulmonale (HCC) The patient will continue anticoagulation for now as there have not been any problems or complications at this point.  IVC filter is strongly indicated prior to high risk orthopedic surgery.  Especially given the history of PE with the past DVT.  IVC filter placement will be done the week for surgery. Risk and benefits were reviewed the patient.  Indications for the procedure were reviewed.  All questions were answered, the patient agrees to proceed.   I have had a long discussion with the patient  regarding DVT and post phlebitic changes such as swelling and why it  causes symptoms such as pain.  The patient will wear graduated compression stockings class 1 (20-30 mmHg) on a daily basis a prescription was given. The patient will  beginning wearing the stockings first thing in the morning and removing them in the evening. The patient is instructed specifically not to sleep in the stockings.  In addition, behavioral modification including elevation during the day and avoidance of prolonged dependency will be initiated.    The patient will follow-up with me in 3 months after the joint replacement surgery to discuss removal (this was also discussed today and the patient agrees with the plan to have the filter removed).    2. Chronic obstructive pulmonary disease, unspecified COPD type (HCC) Continue pulmonary medications and aerosols as already ordered, these medications have been reviewed and there are no changes at this time.    3. Primary osteoarthritis of both knees Continue NSAID medications as already ordered, these medications have been reviewed and there are no changes at this time.  Continued activity and therapy was stressed.   4. Hypercholesteremia Continue statin as ordered and reviewed, no changes at this time  [name], MD  11/26/2019 12:51 PM

## 2019-11-26 NOTE — H&P (Signed)
MRN : [id number]  Kyle Wells is a 86 y.o. (07-08-1933) male who presents with chief complaint of Dr. Ernest Pine is going to fix my left knee.  History of Present Illness:   The patient presents to [facility] for placement of an IVC filter.  There is a past history of PEin association with DJD requiring joint replacement surgery. PEwas identified in May of 2021and is beingtreated with anticoagulation. The presenting symptoms were pain and shortness of breath.  The patient notes his left knee has significantly worsened and he is now scheduled for a right total knee replacement.  The patient notes the leg continues to be mildly painful with dependency and still swells quite a bite. Symptoms are much better with elevation. The patient notes minimal edema in the morning which steadily worsens throughout the day.   The patient has not been using compression therapy at this point.  No SOB or pleuritic chest pains. No cough or hemoptysis.  No blood per rectum or blood in any sputum. No excessive bruising per the patient.   Active Medications      Current Meds  Medication Sig  . ADVAIR DISKUS 500-50 MCG/DOSE AEPB Inhale 1 puff into the lungs 2 (two) times daily.   Marland Kitchen azelastine (ASTELIN) 0.1 % nasal spray Place 2 sprays into both nostrils daily as needed for allergies.   Marland Kitchen docusate sodium (COLACE) 100 MG capsule Take 100 mg by mouth at bedtime.  Marland Kitchen SPIRIVA HANDIHALER 18 MCG inhalation capsule Place 18 mcg into inhaler and inhale daily as needed (Wheezing).           Past Medical History:  Diagnosis Date  . Arthritis   . Asthma   . BPH (benign prostatic hyperplasia)   . COPD (chronic obstructive pulmonary disease) (HCC)   . Hypercholesterolemia   . Impingement syndrome of right shoulder   . OSA (obstructive sleep apnea)   . Small bowel obstruction [facility])          Past Surgical History:  Procedure Laterality Date  . COLONOSCOPY    . PROSTATE BIOPSY       Social History Social History        Tobacco Use  . Smoking status: Current Some Day Smoker    Packs/day: 0.10    Types: Cigarettes  . Smokeless tobacco: Never Used  Vaping Use  . Vaping Use: Never used  Substance Use Topics  . Alcohol use: No  . Drug use: No    Family History      Family History  Problem Relation Age of Onset  . Asthma Father   . Heart attack Father          Allergies  Allergen Reactions  . Zileuton Hives    [Onset: 10/27/2009]    . Roflumilast Other (See Comments)    insomnia   [Onset: 11/29/2010]  Other reaction(s): Other (See Comments) Insomnia     REVIEW OF SYSTEMS (Negative unless checked)  Constitutional: [] ?Weight loss  [] ?Fever  [] ?Chills Cardiac: [] ?Chest pain   [] ?Chest pressure   [] ?Palpitations   [] ?Shortness of breath when laying flat   [] ?Shortness of breath with exertion. Vascular:  [] ?Pain in legs with walking   [] ?Pain in legs at rest  [] ?History of DVT   [] ?Phlebitis   [x] ?Swelling in legs   [] ?Varicose veins   [] ?Non-healing ulcers Pulmonary:   [] ?Uses home oxygen   [] ?Productive cough   [] ?Hemoptysis   [] ?Wheeze  [x] ?COPD   [] ?Asthma Neurologic:  [] ?Dizziness   [] ?Seizures   [] ?  History of stroke   [] ?History of TIA  [] ?Aphasia   [] ?Vissual changes   [] ?Weakness or numbness in arm   [] ?Weakness or numbness in leg Musculoskeletal:   [] ?Joint swelling   [x] ?Joint pain   [] ?Low back pain Hematologic:  [] ?Easy bruising  [] ?Easy bleeding   [] ?Hypercoagulable state   [] ?Anemic Gastrointestinal:  [] ?Diarrhea   [] ?Vomiting  [] ?Gastroesophageal reflux/heartburn   [] ?Difficulty swallowing. Genitourinary:  [] ?Chronic kidney disease   [] ?Difficult urination  [] ?Frequent urination   [] ?Blood in urine Skin:  [] ?Rashes   [] ?Ulcers  Psychological:  [] ?History of anxiety   [] ? History of major depression.  Physical Examination     Vitals:   11/26/19 1105  BP: 126/81  Pulse: 70  Resp: 20  Temp: 98.2 F  (36.8 C)  TempSrc: Oral  SpO2: 94%  Weight: 90.7 kg  Height: 5\' 7"  (1.702 m)   Body mass index is 31.32 kg/m. Gen: WD/WN, NAD Head: [place]/AT, No temporalis wasting.  Ear/Nose/Throat: Hearing grossly intact, nares w/o erythema or drainage Eyes: PER, EOMI, sclera nonicteric.  Neck: Supple, no large masses.   Pulmonary:  Good air movement, no audible wheezing bilaterally, no use of accessory muscles.  Cardiac: RRR, no JVD Vascular: scattered varicosities present bilaterally.  Mild venous stasis changes to the legs bilaterally.  2-3+ soft pitting edema Vessel Right Left  Radial Palpable Palpable  PT Palpable Palpable  DP Palpable Palpable  Gastrointestinal: Non-distended. No guarding/no peritoneal signs.  Musculoskeletal: M/S 5/5 throughout.  No deformity or atrophy.  Neurologic: CN 2-12 intact. Symmetrical.  Speech is fluent. Motor exam as listed above. Psychiatric: Judgment intact, Mood & affect appropriate for pt's clinical situation. Dermatologic: Mild venous rashes no ulcers noted.  No changes consistent with cellulitis. Lymph : No lichenification or skin changes of chronic lymphedema.  CBC Recent Labs       Lab Results  Component Value Date   WBC 8.7 05/29/2019   HGB 14.8 05/29/2019   HCT 43.7 05/29/2019   MCV 94.0 05/29/2019   PLT 187 05/29/2019      BMET Labs (Brief)          Component Value Date/Time   NA 139 05/29/2019 0456   NA 135 (L) 08/02/2013 0315   K 3.8 05/29/2019 0456   K 4.0 08/02/2013 0315   CL 106 05/29/2019 0456   CL 100 08/02/2013 0315   CO2 28 05/29/2019 0456   CO2 27 08/02/2013 0315   GLUCOSE 114 (H) 05/29/2019 0456   GLUCOSE 123 (H) 08/02/2013 0315   BUN 17 05/29/2019 0456   BUN 19 (H) 08/02/2013 0315   CREATININE 0.89 05/29/2019 0456   CREATININE 1.08 08/02/2013 0315   CALCIUM 8.2 (L) 05/29/2019 0456   CALCIUM 8.7 08/02/2013 0315   GFRNONAA >60 05/29/2019 0456   GFRNONAA >60 08/02/2013 0315   GFRAA >60  05/29/2019 0456   GFRAA >60 08/02/2013 0315     CrCl cannot be calculated (Patient's most recent lab result is older than the maximum 21 days allowed.).  COAG Recent Labs       Lab Results  Component Value Date   INR 0.9 04/13/2018   INR 0.97 01/23/2018      Radiology Imaging Results  No results found.     Assessment/Plan 1. Multiple subsegmental pulmonary emboli without acute cor pulmonale (HCC) The patient will continue anticoagulation for now as there have not been any problems or complications at this point. IVC filter is strongly indicated prior to high risk orthopedic surgery. Especially given  the history of PE with the past DVT.  IVC filter placement will be done the week for surgery. Risk and benefits were reviewed the patient. Indications for the procedure were reviewed. All questions were answered, the patient agrees to proceed.   I have had a long discussion with the patient regarding DVT and post phlebitic changes such as swelling and why it causes symptoms such as pain. The patient will wear graduated compression stockings class 1 (20-30 mmHg) on a daily basis a prescription was given. The patient will beginning wearing the stockings first thing in the morning and removing them in the evening. The patient is instructed specifically not to sleep in the stockings. In addition, behavioral modification including elevation during the day and avoidance of prolonged dependency will be initiated.   The patient will follow-up with me in 3 months after the joint replacement surgery to discuss removal (this was also discussed today and the patient agrees with the plan to have the filter removed).   2. Chronic obstructive pulmonary disease, unspecified COPD type (HCC) Continue pulmonary medications and aerosols as already ordered, these medications have been reviewed and there are no changes at this time.    3. Primary osteoarthritis of both  knees Continue NSAID medications as already ordered, these medications have been reviewed and there are no changes at this time.  Continued activity and therapy was stressed.   4. Hypercholesteremia Continue statin as ordered and reviewed, no changes at this time  [name], MD  11/26/2019 12:51 PM

## 2019-11-29 ENCOUNTER — Encounter
Admission: RE | Admit: 2019-11-29 | Discharge: 2019-11-29 | Disposition: A | Discharge status: Home / Self Care | Payer: Medicare PPO | Source: Ambulatory Visit | Attending: Orthopedic Surgery | Admitting: Orthopedic Surgery

## 2019-11-29 ENCOUNTER — Encounter: Payer: Self-pay | Admitting: Vascular Surgery

## 2019-11-29 ENCOUNTER — Other Ambulatory Visit: Payer: Self-pay

## 2019-11-29 DIAGNOSIS — Z01818 Encounter for other preprocedural examination: Secondary | ICD-10-CM | POA: Diagnosis not present

## 2019-11-29 LAB — SURGICAL PCR SCREEN
MRSA, PCR: NEGATIVE
Staphylococcus aureus: NEGATIVE

## 2019-11-29 LAB — CBC
HCT: 42.1 % (ref 39.0–52.0)
Hemoglobin: 13.5 g/dL (ref 13.0–17.0)
MCH: 27.8 pg (ref 26.0–34.0)
MCHC: 32.1 g/dL (ref 30.0–36.0)
MCV: 86.8 fL (ref 80.0–100.0)
Platelets: 245 10*3/uL (ref 150–400)
RBC: 4.85 MIL/uL (ref 4.22–5.81)
RDW: 16.2 % — ABNORMAL HIGH (ref 11.5–15.5)
WBC: 5.7 10*3/uL (ref 4.0–10.5)
nRBC: 0 % (ref 0.0–0.2)

## 2019-11-29 LAB — TYPE AND SCREEN
ABO/RH(D): O POS
Antibody Screen: NEGATIVE

## 2019-11-29 LAB — URINALYSIS, ROUTINE W REFLEX MICROSCOPIC
Bilirubin Urine: NEGATIVE
Glucose, UA: NEGATIVE mg/dL
Hgb urine dipstick: NEGATIVE
Ketones, ur: NEGATIVE mg/dL
Leukocytes,Ua: NEGATIVE
Nitrite: NEGATIVE
Protein, ur: NEGATIVE mg/dL
Specific Gravity, Urine: >1.03 — ABNORMAL HIGH (ref 1.005–1.030)
pH: 5 (ref 5.0–8.0)

## 2019-11-29 LAB — PROTIME-INR
INR: 2.1 — ABNORMAL HIGH (ref 0.8–1.2)
Prothrombin Time: 22.6 seconds — ABNORMAL HIGH (ref 11.4–15.2)

## 2019-11-29 LAB — COMPREHENSIVE METABOLIC PANEL
ALT: 24 U/L (ref 0–44)
AST: 30 U/L (ref 15–41)
Albumin: 4.2 g/dL (ref 3.5–5.0)
Alkaline Phosphatase: 56 U/L (ref 38–126)
Anion gap: 7 (ref 5–15)
BUN: 20 mg/dL (ref 8–23)
CO2: 27 mmol/L (ref 22–32)
Calcium: 9.7 mg/dL (ref 8.9–10.3)
Chloride: 102 mmol/L (ref 98–111)
Creatinine, Ser: 0.91 mg/dL (ref 0.61–1.24)
GFR, Estimated: >60 mL/min (ref >60)
Glucose, Bld: 101 mg/dL — ABNORMAL HIGH (ref 70–99)
Potassium: 3.9 mmol/L (ref 3.5–5.1)
Sodium: 136 mmol/L (ref 135–145)
Total Bilirubin: 0.6 mg/dL (ref 0.3–1.2)
Total Protein: 7.7 g/dL (ref 6.5–8.1)

## 2019-11-29 LAB — APTT: aPTT: 51 seconds — ABNORMAL HIGH (ref 24–36)

## 2019-11-29 LAB — SEDIMENTATION RATE: Sed Rate: 11 mm/hr (ref 0–16)

## 2019-11-29 LAB — C-REACTIVE PROTEIN: CRP: 0.6 mg/dL (ref <1.0)

## 2019-11-29 NOTE — Patient Instructions (Addendum)
Your procedure is scheduled on: 12/08/19- Wednesday Report to Day Surgery on the 2nd floor of the [facility]. To find out your arrival time, please call [phone] between 1PM - 3PM on: 12/07/19- Tuesday  REMEMBER: Instructions that are not followed completely may result in serious medical risk, up to and including death; or upon the discretion of your surgeon and anesthesiologist your surgery may need to be rescheduled.  Do not eat food after midnight the night before surgery.  No gum chewing, lozengers or hard candies.  You may however, drink CLEAR liquids up to 2 hours before you are scheduled to arrive for your surgery. Do not drink anything within 2 hours of your scheduled arrival time.  Clear liquids include: - water  - apple juice without pulp - gatorade (not RED, PURPLE, OR BLUE) - black coffee or tea (Do NOT add milk or creamers to the coffee or tea) Do NOT drink anything that is not on this list.  Type 1 and Type 2 diabetics should only drink water.  In addition, your doctor has ordered for you to drink the provided  Ensure Pre-Surgery Clear Carbohydrate Drink  Drinking this carbohydrate drink up to two hours before surgery helps to reduce insulin resistance and improve patient outcomes. Please complete drinking 2 hours prior to scheduled arrival time.  TAKE THESE MEDICATIONS THE MORNING OF SURGERY WITH A SIP OF WATER: - ADVAIR DISKUS 500-50 MCG/DOSE AEPB - SPIRIVA HANDIHALER 18 MCG inhalation capsule  Use inhaler albuterol (PROVENTIL HFA;VENTOLIN HFA) 108 (90 Base) MCG/ACT inhaler on the day of surgery and bring to the hospital.  Follow recommendations from Cardiologist, Pulmonologist or PCP regarding stopping Aspirin, Coumadin, Plavix, Eliquis, Pradaxa, or Pletal, - Xarelto- stop taking on 12/03/19 which is 5 days before your surgery, restart on 12/12/19, which is 3 days after your surgery.  One week prior to surgery: Stop Anti-inflammatories (NSAIDS) such as  Advil, Aleve, Ibuprofen, Motrin, Naproxen, Naprosyn and Aspirin based products such as Excedrin, Goodys Powder, BC Powder.  Stop ANY OVER THE COUNTER supplements until after surgery. (You may continue taking Tylenol, Vitamin D, Vitamin B, and multivitamin.)  No Alcohol for 24 hours before or after surgery.  No Smoking including e-cigarettes for 24 hours prior to surgery.  No chewable tobacco products for at least 6 hours prior to surgery.  No nicotine patches on the day of surgery.  Do not use any "recreational" drugs for at least a week prior to your surgery.  Please be advised that the combination of cocaine and anesthesia may have negative outcomes, up to and including death. If you test positive for cocaine, your surgery will be cancelled.  On the morning of surgery brush your teeth with toothpaste and water, you may rinse your mouth with mouthwash if you wish. Do not swallow any toothpaste or mouthwash.  Do not wear jewelry, make-up, hairpins, clips or nail polish.  Do not wear lotions, powders, or perfumes.   Do not shave 48 hours prior to surgery.   Contact lenses, hearing aids and dentures may not be worn into surgery.  Do not bring valuables to the hospital. [facility] is not responsible for any missing/lost belongings or valuables.   Use CHG Soap or wipes as directed on instruction sheet.  Bring your C-PAP to the hospital with you in case you may have to spend the night.   Notify your doctor if there is any change in your medical condition (cold, fever, infection).  Wear comfortable clothing (specific to  your surgery type) to the hospital.  Plan for stool softeners for home use; pain medications have a tendency to cause constipation. You can also help prevent constipation by eating foods high in fiber such as fruits and vegetables and drinking plenty of fluids as your diet allows.  After surgery, you can help prevent lung complications by doing breathing exercises.   Take deep breaths and cough every 1-2 hours. Your doctor may order a device called an Incentive Spirometer to help you take deep breaths. When coughing or sneezing, hold a pillow firmly against your incision with both hands. This is called "splinting." Doing this helps protect your incision. It also decreases belly discomfort.  If you are being admitted to the hospital overnight, leave your suitcase in the car. After surgery it may be brought to your room.  If you are being discharged the day of surgery, you will not be allowed to drive home. You will need a responsible adult (18 years or older) to drive you home and stay with you that night.   If you are taking public transportation, you will need to have a responsible adult (18 years or older) with you. Please confirm with your physician that it is acceptable to use public transportation.   Please call the Pre-admissions Testing Dept. at [phone] if you have any questions about these instructions.  Visitation Policy:  Patients undergoing a surgery or procedure may have one family member or support person with them as long as that person is not COVID-19 positive or experiencing its symptoms.  That person may remain in the waiting area during the procedure.  Inpatient Visitation Update:   In an effort to ensure the safety of our team members and our patients, we are implementing a change to our visitation policy:  Effective Monday, Aug. 9, at 7 a.m., inpatients will be allowed one support person.  o The support person may change daily.  o The support person must pass our screening, gel in and out, and wear a mask at all times, including in the patient's room.  o Patients must also wear a mask when staff or their support person are in the room.  o Masking is required regardless of vaccination status.  Systemwide, no visitors 17 or younger.

## 2019-12-03 LAB — URINE CULTURE
Culture: 30000 — AB
Special Requests: NORMAL

## 2019-12-05 ENCOUNTER — Encounter: Payer: Self-pay | Admitting: Orthopedic Surgery

## 2019-12-05 NOTE — H&P (Signed)
ORTHOPAEDIC HISTORY & PHYSICAL  Progress Notes Kyle Wells, [name] - 11/29/2019 9:15 AM EDT Chief Complaint: Chief Complaint  Patient presents with   Left Knee - Pre-op Exam   Reason for Visit: The patient is a 86 y.o. male who presents today for history and physical for left total knee arthroplasty with Dr. Ernest Wells on 12/08/2019. He has left greater than right knee pain this been present for years. He complains of pain along the medial joint line of the left knee. He reports some swelling, no locking, and no significant giving way of the knee. The pain is aggravated by any weight bearing. The patient has not appreciated any significant improvement despite multiple intraarticular corticosteroid injections, Tylenol, NSAIDs, ambulatory aids, and activity modification. The knee pain limits his ability to ambulate long distances. He is using a walker for ambulation. The patient states that the left knee pain has progressed to the point that it is significantly interfering with his activities of daily living.  He was originally scheduled for surgery in 2020 but postponed the procedure due to the COVID pandemic surge. Surgery was subsequently canceled earlier this year due to a small bowel obstruction and pulmonary embolism. He has been anticoagulated with Xarelto since May.  Medications: Current Outpatient Medications  Medication Sig Dispense Refill   azelastine (ASTELIN) 137 mcg nasal spray Place into one nostril   acetaminophen (TYLENOL) 500 MG tablet Take 1,000 mg by mouth every 8 (eight) hours as needed   albuterol (PROVENTIL) 2.5 mg /3 mL (0.083 %) nebulizer solution TAKE 3 MLS (2.5 MG TOTAL) BY NEBULIZATION 4 (FOUR) TIMES DAILY 1080 mL 1   albuterol 90 mcg/actuation inhaler INHALE 2 INHALATIONS INTO THE LUNGS EVERY 4 (FOUR) HOURS AS NEEDED FOR WHEEZING 18 g 3   celecoxib (CELEBREX) 200 MG capsule Take 1 capsule (200 mg total) by mouth 2 (two) times daily for 30 days After food 60  capsule 0   chlorhexidine (PERIDEX) 0.12 % solution RINSE MOUTH WITH FOR 30 SECONDS AM AND PM AFTER BRUSHING. SPIT AFTER RINSING, DO NOT SWALLOW   docusate (COLACE) 100 MG capsule Take by mouth   finasteride (PROSCAR) 5 mg tablet TAKE 1 TABLET BY MOUTH EVERY DAY 90 tablet 1   fluticasone propion-salmeteroL (ADVAIR DISKUS) 500-50 mcg/dose diskus inhaler Inhale 1 inhalation into the lungs every 12 (twelve) hours 3 Inhaler 3   fluticasone propionate (FLONASE) 50 mcg/actuation nasal spray PLACE 1 SPRAY INTO BOTH NOSTRILS 2 (TWO) TIMES DAILY 48 mL 3   ibuprofen (MOTRIN) 400 MG tablet Take 400 mg by mouth every 8 (eight) hours as needed   ketoconazole (NIZORAL) 2 % shampoo Mix with OTC selsum blue and apply to rash on back and chest. Let it sit for 5 min and then wash off. Do this daily until clear   montelukast (SINGULAIR) 10 mg tablet Take 1 tablet (10 mg total) by mouth once daily 90 tablet 3   multivitamin-minerals-lutein Tab 1 tab by mouth daily   naproxen sodium (ALEVE) 220 MG tablet Take 220 mg by mouth 2 (two) times daily as needed   polyethylene glycol (MIRALAX) packet Take 17 g by mouth once daily   rivaroxaban (XARELTO) 20 mg tablet Take 1 tablet (20 mg total) by mouth daily with breakfast 90 tablet 1   sennosides-docusate (SENOKOT-S) 8.6-50 mg tablet Take 2 tablets by mouth once daily as needed   tiotropium (SPIRIVA WITH HANDIHALER) 18 mcg inhalation capsule Place 1 capsule (18 mcg total) into inhaler and inhale once daily  90 capsule 3   No current facility-administered medications for this visit.   Allergies: Allergies  Allergen Reactions   Zileuton Hives   Roflumilast Other (See Comments)  Insomnia    Past Medical History: Past Medical History:  Diagnosis Date   Benign localized hyperplasia of prostate with urinary obstruction and other lower urinary tract symptoms (LUTS)(600.21)   Benign localized prostatic hyperplasia with lower urinary tract symptoms  (LUTS) 10/16/2011   Benign neoplasm of colon   Chronic airway obstruction (CMS-HCC) 10/20/2010   Chronic obstructive airway disease with asthma (CMS-HCC) 05/31/2011   Elevated prostate specific antigen (PSA) 10/16/2011   Elevated PSA   Emphysema, unspecified (CMS-HCC)   Frequency of micturition 10/16/2011   Hemorrhage of rectum and anus   Nausea 12/26/2016   Obesity   Other allergy, other than to medicinal agents 10/20/2010   Retention of urine 10/16/2011   Sleep apnea  on CPAP   Small bowel obstruction (CMS-HCC) 09/30/2014   Tobacco abuse   Past Surgical History: Past Surgical History:  Procedure Laterality Date   Biopsy of prostate  normal, Dr. Evelene Wells   COLONOSCOPY   EGD   Social History: Social History   Socioeconomic History   Marital status: Married  Spouse name: Kyle Wells   Number of children: 2   Years of education: 14   Highest education level: Not on file  Occupational History   Occupation: Retired  Tobacco Use   Smoking status: Current Every Day Smoker  Packs/day: 0.10  Years: 25.00  Pack years: 2.50  Types: Cigarettes  Last attempt to quit: 02/05/2018  Years since quitting: 1.8   Smokeless tobacco: Never Used  Vaping Use   Vaping Use: Never used  Substance and Sexual Activity   Alcohol use: No  Alcohol/week: 0.0 standard drinks   Drug use: No   Sexual activity: Defer  Partners: Female  Other Topics Concern   Not on file  Social History Narrative   Not on file   Social Determinants of Health   Financial Resource Strain: Not on file  Food Insecurity: Not on file  Transportation Needs: Not on file  Physical Activity: Not on file  Stress: Not on file  Social Connections: Not on file  Housing Stability: Not on file   Family History: Family History  Problem Relation Age of Onset   Asthma Father   Myocardial Infarction (Heart attack) Father   Review of Systems: A comprehensive 14 point ROS was performed, reviewed, and the  pertinent orthopaedic findings are documented in the HPI.  Exam Ht 167.6 cm (5\' 6" )   Wt 92.1 kg (203 lb)   BMI 32.77 kg/m   General:  Well-developed, well-nourished male seen in no acute distress.  Patient has a slow antalgic gait with varus thrust of both knees. He ambulates with a walker.  HEENT:  Atraumatic, normocephalic. Pupils are equal and reactive to light. Extraocular motion is intact. Sclera are clear. Oropharynx is clear with moist mucosa.  Neck:  Supple, nontender, and with good ROM. No thyromegaly, adenopathy, JVD, or carotid bruits.  Lungs:  Clear to auscultation bilaterally.  Cardiovascular:  Regular rate and rhythm. Normal S1, S2. No murmur . No appreciable gallops or rubs. Peripheral pulses are palpable. No lower extremity edema. Homan`s test is negative.  Abdomen:  Soft, nontender, nondistended. Bowel sounds are present.  Extremities: Good strength, stability, and range of motion of the upper extremities. Good range of motion of the hips and ankles.  Left Knee: Soft tissue swelling: moderate Effusion:  mild Erythema: none Crepitance: moderate Tenderness: medial Alignment: relative varus Mediolateral laxity: medial pseudolaxity Posterior sag: negative Patellar tracking: Good tracking without evidence of subluxation or tilt Atrophy: Generalized quadriceps atrophy.  Quadriceps tone was fair to good. Range of motion: 0/30/95 degrees  Neurologic:  Awake, alert, and oriented.  Sensory function is intact to pinprick and light touch.  Motor strength is judged to be 5/5.  Motor coordination is within normal limits.  No apparent clonus. No tremor.   X-rays: X-rays of the left knee reviewed by me today from 10/12/2019. There is narrowing of the medial cartilage space with bone-on-bone articulation and associated varus alignment. Osteophyte formation is noted. Subchondral sclerosis is noted. No evidence of fracture or dislocation.   Impression: Degenerative  arthrosis of left knee.  Plan:  Risks, benefits, complications of a left total knee arthroplasty been discussed with the patient. Patient has agreed and consented procedure with Dr. Ernest Wells on 12/08/2019.  MEDICAL CLEARANCE: Per anesthesiology. ACTIVITY: As tolerated. WORK STATUS: Not applicable. THERAPY: Preoperative physical therapy evaluation. MEDICATIONS: Requested Prescriptions   No prescriptions requested or ordered in this encounter   FOLLOW-UP: No follow-ups on file.  This note was generated in part with voice recognition software and I apologize for any typographical errors that were not detected and corrected.  Kyle Wells MPA-C   Electronically signed by Kyle Musca, PA at 11/30/2019 10:04 AM EDT

## 2019-12-06 ENCOUNTER — Other Ambulatory Visit
Admission: RE | Admit: 2019-12-06 | Discharge: 2019-12-06 | Disposition: A | Discharge status: Home / Self Care | Payer: Medicare PPO | Source: Ambulatory Visit | Attending: Orthopedic Surgery | Admitting: Orthopedic Surgery

## 2019-12-06 ENCOUNTER — Other Ambulatory Visit: Payer: Self-pay

## 2019-12-06 DIAGNOSIS — Z01812 Encounter for preprocedural laboratory examination: Secondary | ICD-10-CM | POA: Insufficient documentation

## 2019-12-06 DIAGNOSIS — Z20822 Contact with and (suspected) exposure to covid-19: Secondary | ICD-10-CM | POA: Insufficient documentation

## 2019-12-07 ENCOUNTER — Encounter: Payer: Self-pay | Admitting: Orthopedic Surgery

## 2019-12-07 LAB — SARS CORONAVIRUS 2 (TAT 6-24 HRS): SARS Coronavirus 2: NEGATIVE

## 2019-12-08 ENCOUNTER — Inpatient Hospital Stay: Payer: Medicare PPO

## 2019-12-08 ENCOUNTER — Encounter: Payer: Self-pay | Admitting: Orthopedic Surgery

## 2019-12-08 ENCOUNTER — Encounter
Admission: RE | Disposition: A | Discharge status: Home Health | Payer: Self-pay | Source: Home / Self Care | Attending: Orthopedic Surgery

## 2019-12-08 ENCOUNTER — Inpatient Hospital Stay: Payer: Medicare PPO | Admitting: Urgent Care

## 2019-12-08 ENCOUNTER — Inpatient Hospital Stay
Admission: RE | Admit: 2019-12-08 | Discharge: 2019-12-10 | DRG: 470 | Disposition: A | Discharge status: Home Health | Payer: Medicare PPO | Attending: Orthopedic Surgery | Admitting: Orthopedic Surgery

## 2019-12-08 ENCOUNTER — Other Ambulatory Visit: Payer: Self-pay

## 2019-12-08 DIAGNOSIS — J449 Chronic obstructive pulmonary disease, unspecified: Secondary | ICD-10-CM | POA: Diagnosis present

## 2019-12-08 DIAGNOSIS — M1712 Unilateral primary osteoarthritis, left knee: Principal | ICD-10-CM | POA: Diagnosis present

## 2019-12-08 DIAGNOSIS — Z86711 Personal history of pulmonary embolism: Secondary | ICD-10-CM | POA: Diagnosis not present

## 2019-12-08 DIAGNOSIS — Z86010 Personal history of colonic polyps: Secondary | ICD-10-CM | POA: Diagnosis not present

## 2019-12-08 DIAGNOSIS — E669 Obesity, unspecified: Secondary | ICD-10-CM | POA: Diagnosis present

## 2019-12-08 DIAGNOSIS — Z20822 Contact with and (suspected) exposure to covid-19: Secondary | ICD-10-CM | POA: Diagnosis present

## 2019-12-08 DIAGNOSIS — G4733 Obstructive sleep apnea (adult) (pediatric): Secondary | ICD-10-CM | POA: Diagnosis present

## 2019-12-08 DIAGNOSIS — Z8249 Family history of ischemic heart disease and other diseases of the circulatory system: Secondary | ICD-10-CM | POA: Diagnosis not present

## 2019-12-08 DIAGNOSIS — Z825 Family history of asthma and other chronic lower respiratory diseases: Secondary | ICD-10-CM

## 2019-12-08 DIAGNOSIS — Z6831 Body mass index (BMI) 31.0-31.9, adult: Secondary | ICD-10-CM

## 2019-12-08 DIAGNOSIS — E7800 Pure hypercholesterolemia, unspecified: Secondary | ICD-10-CM | POA: Diagnosis present

## 2019-12-08 DIAGNOSIS — Z79899 Other long term (current) drug therapy: Secondary | ICD-10-CM | POA: Diagnosis not present

## 2019-12-08 DIAGNOSIS — Z96659 Presence of unspecified artificial knee joint: Secondary | ICD-10-CM

## 2019-12-08 DIAGNOSIS — Z7901 Long term (current) use of anticoagulants: Secondary | ICD-10-CM

## 2019-12-08 DIAGNOSIS — Z888 Allergy status to other drugs, medicaments and biological substances status: Secondary | ICD-10-CM | POA: Diagnosis not present

## 2019-12-08 DIAGNOSIS — Z95828 Presence of other vascular implants and grafts: Secondary | ICD-10-CM | POA: Diagnosis not present

## 2019-12-08 DIAGNOSIS — Z96652 Presence of left artificial knee joint: Secondary | ICD-10-CM

## 2019-12-08 HISTORY — PX: KNEE ARTHROPLASTY: SHX992

## 2019-12-08 SURGERY — ARTHROPLASTY, KNEE, TOTAL, USING IMAGELESS COMPUTER-ASSISTED NAVIGATION
Anesthesia: Spinal | Site: Knee | Laterality: Left

## 2019-12-08 MED ORDER — FINASTERIDE 5 MG PO TABS
5.0000 mg | ORAL_TABLET | Freq: Every day | ORAL | Status: DC
  Administered 2019-12-08 – 2019-12-09 (×2): 5 mg via ORAL
  Filled 2019-12-08 (×2): qty 1

## 2019-12-08 MED ORDER — CELECOXIB 200 MG PO CAPS
ORAL_CAPSULE | ORAL | Status: AC
  Administered 2019-12-08: 400 mg via ORAL
  Filled 2019-12-08: qty 2

## 2019-12-08 MED ORDER — FAMOTIDINE 20 MG PO TABS
ORAL_TABLET | ORAL | Status: AC
  Administered 2019-12-08: 20 mg via ORAL
  Filled 2019-12-08: qty 1

## 2019-12-08 MED ORDER — MENTHOL 3 MG MT LOZG
1.0000 | LOZENGE | OROMUCOSAL | Status: DC | PRN
  Filled 2019-12-08: qty 9

## 2019-12-08 MED ORDER — DIPHENHYDRAMINE HCL 12.5 MG/5ML PO ELIX: 12.5000 mg | ORAL_SOLUTION | ORAL | Status: DC | PRN

## 2019-12-08 MED ORDER — CHLORHEXIDINE GLUCONATE 0.12 % MT SOLN
OROMUCOSAL | Status: AC
  Administered 2019-12-08: 15 mL via OROMUCOSAL
  Filled 2019-12-08: qty 15

## 2019-12-08 MED ORDER — FENTANYL CITRATE (PF) 100 MCG/2ML IJ SOLN: 25.0000 ug | INTRAMUSCULAR | Status: DC | PRN

## 2019-12-08 MED ORDER — FENTANYL CITRATE (PF) 100 MCG/2ML IJ SOLN
INTRAMUSCULAR | Status: AC
  Filled 2019-12-08: qty 2

## 2019-12-08 MED ORDER — GABAPENTIN 300 MG PO CAPS: 300.0000 mg | ORAL_CAPSULE | Freq: Once | ORAL | Status: AC

## 2019-12-08 MED ORDER — TRANEXAMIC ACID-NACL 1000-0.7 MG/100ML-% IV SOLN
INTRAVENOUS | Status: AC
  Filled 2019-12-08: qty 100

## 2019-12-08 MED ORDER — ONDANSETRON HCL 4 MG/2ML IJ SOLN: 4.0000 mg | Freq: Four times a day (QID) | INTRAMUSCULAR | Status: DC | PRN

## 2019-12-08 MED ORDER — PANTOPRAZOLE SODIUM 40 MG PO TBEC
40.0000 mg | DELAYED_RELEASE_TABLET | Freq: Two times a day (BID) | ORAL | Status: DC
  Administered 2019-12-08 – 2019-12-10 (×4): 40 mg via ORAL
  Filled 2019-12-08 (×4): qty 1

## 2019-12-08 MED ORDER — RIVAROXABAN 20 MG PO TABS
20.0000 mg | ORAL_TABLET | Freq: Every day | ORAL | Status: DC
  Administered 2019-12-09 – 2019-12-10 (×2): 20 mg via ORAL
  Filled 2019-12-08 (×3): qty 1

## 2019-12-08 MED ORDER — MOMETASONE FURO-FORMOTEROL FUM 200-5 MCG/ACT IN AERO
2.0000 | INHALATION_SPRAY | Freq: Two times a day (BID) | RESPIRATORY_TRACT | Status: DC
  Administered 2019-12-08 – 2019-12-10 (×4): 2 via RESPIRATORY_TRACT
  Filled 2019-12-08: qty 8.8

## 2019-12-08 MED ORDER — TRANEXAMIC ACID-NACL 1000-0.7 MG/100ML-% IV SOLN
INTRAVENOUS | Status: AC
  Administered 2019-12-08: 1000 mg via INTRAVENOUS
  Filled 2019-12-08: qty 100

## 2019-12-08 MED ORDER — GABAPENTIN 300 MG PO CAPS
300.0000 mg | ORAL_CAPSULE | Freq: Every day | ORAL | Status: DC
  Administered 2019-12-08 – 2019-12-09 (×2): 300 mg via ORAL
  Filled 2019-12-08 (×2): qty 1

## 2019-12-08 MED ORDER — ONDANSETRON HCL 4 MG/2ML IJ SOLN: 4.0000 mg | Freq: Once | INTRAMUSCULAR | Status: DC | PRN

## 2019-12-08 MED ORDER — TRAMADOL HCL 50 MG PO TABS: 50.0000 mg | ORAL_TABLET | ORAL | Status: DC | PRN

## 2019-12-08 MED ORDER — PROPOFOL 10 MG/ML IV BOLUS
INTRAVENOUS | Status: DC | PRN
  Administered 2019-12-08 (×2): 20 mg via INTRAVENOUS

## 2019-12-08 MED ORDER — ORAL CARE MOUTH RINSE: 15.0000 mL | Freq: Once | OROMUCOSAL | Status: AC

## 2019-12-08 MED ORDER — HYDROMORPHONE HCL 1 MG/ML IJ SOLN: 0.5000 mg | INTRAMUSCULAR | Status: DC | PRN

## 2019-12-08 MED ORDER — SODIUM CHLORIDE 0.9 % IV SOLN: INTRAVENOUS | Status: DC

## 2019-12-08 MED ORDER — ACETAMINOPHEN 325 MG PO TABS: 325.0000 mg | ORAL_TABLET | Freq: Four times a day (QID) | ORAL | Status: DC | PRN

## 2019-12-08 MED ORDER — AZELASTINE HCL 0.1 % NA SOLN
2.0000 | Freq: Every day | NASAL | Status: DC | PRN
  Filled 2019-12-08: qty 30

## 2019-12-08 MED ORDER — SURGIPHOR WOUND IRRIGATION SYSTEM - OPTIME
TOPICAL | Status: DC | PRN
  Administered 2019-12-08: 1 via TOPICAL

## 2019-12-08 MED ORDER — ALBUTEROL SULFATE (2.5 MG/3ML) 0.083% IN NEBU: 3.0000 mL | INHALATION_SOLUTION | Freq: Four times a day (QID) | RESPIRATORY_TRACT | Status: DC | PRN

## 2019-12-08 MED ORDER — METOCLOPRAMIDE HCL 10 MG PO TABS
10.0000 mg | ORAL_TABLET | Freq: Three times a day (TID) | ORAL | Status: DC
  Administered 2019-12-08 – 2019-12-10 (×6): 10 mg via ORAL
  Filled 2019-12-08 (×6): qty 1

## 2019-12-08 MED ORDER — ENSURE PRE-SURGERY PO LIQD
296.0000 mL | Freq: Once | ORAL | Status: AC
  Administered 2019-12-08: 296 mL via ORAL
  Filled 2019-12-08: qty 296

## 2019-12-08 MED ORDER — PROPOFOL 500 MG/50ML IV EMUL
INTRAVENOUS | Status: DC | PRN
  Administered 2019-12-08: 100 ug/kg/min via INTRAVENOUS

## 2019-12-08 MED ORDER — TRANEXAMIC ACID-NACL 1000-0.7 MG/100ML-% IV SOLN
1000.0000 mg | INTRAVENOUS | Status: AC
  Administered 2019-12-08: 1000 mg via INTRAVENOUS

## 2019-12-08 MED ORDER — MIDAZOLAM HCL 5 MG/5ML IJ SOLN
INTRAMUSCULAR | Status: DC | PRN
  Administered 2019-12-08: 1 mg via INTRAVENOUS

## 2019-12-08 MED ORDER — PHENYLEPHRINE HCL (PRESSORS) 10 MG/ML IV SOLN
INTRAVENOUS | Status: DC | PRN
  Administered 2019-12-08 (×4): 100 ug via INTRAVENOUS

## 2019-12-08 MED ORDER — GABAPENTIN 300 MG PO CAPS
ORAL_CAPSULE | ORAL | Status: AC
  Administered 2019-12-08: 300 mg via ORAL
  Filled 2019-12-08: qty 1

## 2019-12-08 MED ORDER — CEFAZOLIN SODIUM-DEXTROSE 2-4 GM/100ML-% IV SOLN
INTRAVENOUS | Status: AC
  Filled 2019-12-08: qty 100

## 2019-12-08 MED ORDER — ACETAMINOPHEN 10 MG/ML IV SOLN
INTRAVENOUS | Status: AC
  Filled 2019-12-08: qty 100

## 2019-12-08 MED ORDER — CELECOXIB 200 MG PO CAPS: 400.0000 mg | ORAL_CAPSULE | Freq: Once | ORAL | Status: AC

## 2019-12-08 MED ORDER — MONTELUKAST SODIUM 10 MG PO TABS
10.0000 mg | ORAL_TABLET | Freq: Every day | ORAL | Status: DC
  Administered 2019-12-08 – 2019-12-09 (×2): 10 mg via ORAL
  Filled 2019-12-08 (×2): qty 1

## 2019-12-08 MED ORDER — SODIUM CHLORIDE 0.9 % IV SOLN
INTRAVENOUS | Status: DC | PRN
  Administered 2019-12-08: 60 mL

## 2019-12-08 MED ORDER — FAMOTIDINE 20 MG PO TABS: 20.0000 mg | ORAL_TABLET | Freq: Once | ORAL | Status: AC

## 2019-12-08 MED ORDER — PROPOFOL 500 MG/50ML IV EMUL
INTRAVENOUS | Status: AC
  Filled 2019-12-08: qty 50

## 2019-12-08 MED ORDER — BUPIVACAINE HCL (PF) 0.25 % IJ SOLN
INTRAMUSCULAR | Status: DC | PRN
  Administered 2019-12-08: 60 mL

## 2019-12-08 MED ORDER — MIDAZOLAM HCL 2 MG/2ML IJ SOLN
INTRAMUSCULAR | Status: AC
  Filled 2019-12-08: qty 2

## 2019-12-08 MED ORDER — CEFAZOLIN SODIUM-DEXTROSE 2-4 GM/100ML-% IV SOLN
2.0000 g | INTRAVENOUS | Status: AC
  Administered 2019-12-08: 2 g via INTRAVENOUS

## 2019-12-08 MED ORDER — GLYCOPYRROLATE 0.2 MG/ML IJ SOLN
INTRAMUSCULAR | Status: DC | PRN
  Administered 2019-12-08 (×2): .1 mg via INTRAVENOUS

## 2019-12-08 MED ORDER — CHLORHEXIDINE GLUCONATE 4 % EX LIQD
60.0000 mL | Freq: Once | CUTANEOUS | Status: AC
  Administered 2019-12-08: 4 via TOPICAL

## 2019-12-08 MED ORDER — CHLORHEXIDINE GLUCONATE 0.12 % MT SOLN: 15.0000 mL | Freq: Once | OROMUCOSAL | Status: AC

## 2019-12-08 MED ORDER — ONDANSETRON HCL 4 MG/2ML IJ SOLN
INTRAMUSCULAR | Status: DC | PRN
  Administered 2019-12-08: 4 mg via INTRAVENOUS

## 2019-12-08 MED ORDER — FENTANYL CITRATE (PF) 100 MCG/2ML IJ SOLN
INTRAMUSCULAR | Status: DC | PRN
  Administered 2019-12-08 (×2): 25 ug via INTRAVENOUS

## 2019-12-08 MED ORDER — BUPIVACAINE HCL (PF) 0.5 % IJ SOLN
INTRAMUSCULAR | Status: DC | PRN
  Administered 2019-12-08: 3 mL

## 2019-12-08 MED ORDER — SODIUM CHLORIDE 0.9 % IR SOLN
Status: DC | PRN
  Administered 2019-12-08: 500 mL

## 2019-12-08 MED ORDER — FERROUS SULFATE 325 (65 FE) MG PO TABS
325.0000 mg | ORAL_TABLET | Freq: Two times a day (BID) | ORAL | Status: DC
  Administered 2019-12-08 – 2019-12-10 (×4): 325 mg via ORAL
  Filled 2019-12-08 (×4): qty 1

## 2019-12-08 MED ORDER — CEFAZOLIN SODIUM-DEXTROSE 2-4 GM/100ML-% IV SOLN
2.0000 g | Freq: Four times a day (QID) | INTRAVENOUS | Status: AC
  Administered 2019-12-08 – 2019-12-09 (×2): 2 g via INTRAVENOUS
  Filled 2019-12-08 (×2): qty 100

## 2019-12-08 MED ORDER — PHENOL 1.4 % MT LIQD
1.0000 | OROMUCOSAL | Status: DC | PRN
  Filled 2019-12-08: qty 177

## 2019-12-08 MED ORDER — FLEET ENEMA 7-19 GM/118ML RE ENEM: 1.0000 | ENEMA | Freq: Once | RECTAL | Status: DC | PRN

## 2019-12-08 MED ORDER — BISACODYL 10 MG RE SUPP: 10.0000 mg | Freq: Every day | RECTAL | Status: DC | PRN

## 2019-12-08 MED ORDER — DEXAMETHASONE SODIUM PHOSPHATE 10 MG/ML IJ SOLN
INTRAMUSCULAR | Status: AC
  Administered 2019-12-08: 8 mg via INTRAVENOUS
  Filled 2019-12-08: qty 1

## 2019-12-08 MED ORDER — TIOTROPIUM BROMIDE MONOHYDRATE 18 MCG IN CAPS
18.0000 ug | ORAL_CAPSULE | Freq: Every day | RESPIRATORY_TRACT | Status: DC | PRN
  Filled 2019-12-08: qty 5

## 2019-12-08 MED ORDER — ADULT MULTIVITAMIN W/MINERALS CH
1.0000 | ORAL_TABLET | Freq: Every day | ORAL | Status: DC
  Administered 2019-12-08 – 2019-12-10 (×3): 1 via ORAL
  Filled 2019-12-08 (×3): qty 1

## 2019-12-08 MED ORDER — POLYETHYLENE GLYCOL 3350 17 G PO PACK
17.0000 g | PACK | Freq: Every day | ORAL | Status: DC
  Administered 2019-12-08 – 2019-12-09 (×2): 17 g via ORAL
  Filled 2019-12-08 (×2): qty 1

## 2019-12-08 MED ORDER — ACETAMINOPHEN 10 MG/ML IV SOLN
INTRAVENOUS | Status: DC | PRN
  Administered 2019-12-08: 1000 mg via INTRAVENOUS

## 2019-12-08 MED ORDER — ACETAMINOPHEN 10 MG/ML IV SOLN
1000.0000 mg | Freq: Four times a day (QID) | INTRAVENOUS | Status: AC
  Administered 2019-12-08 – 2019-12-09 (×4): 1000 mg via INTRAVENOUS
  Filled 2019-12-08 (×4): qty 100

## 2019-12-08 MED ORDER — ALBUTEROL SULFATE HFA 108 (90 BASE) MCG/ACT IN AERS
2.0000 | INHALATION_SPRAY | RESPIRATORY_TRACT | Status: DC | PRN
  Filled 2019-12-08: qty 6.7

## 2019-12-08 MED ORDER — ALUM & MAG HYDROXIDE-SIMETH 200-200-20 MG/5ML PO SUSP: 30.0000 mL | ORAL | Status: DC | PRN

## 2019-12-08 MED ORDER — TRANEXAMIC ACID-NACL 1000-0.7 MG/100ML-% IV SOLN: 1000.0000 mg | Freq: Once | INTRAVENOUS | Status: AC

## 2019-12-08 MED ORDER — ONDANSETRON HCL 4 MG PO TABS: 4.0000 mg | ORAL_TABLET | Freq: Four times a day (QID) | ORAL | Status: DC | PRN

## 2019-12-08 MED ORDER — SODIUM CHLORIDE 0.9 % IV SOLN
INTRAVENOUS | Status: DC | PRN
  Administered 2019-12-08: 30 ug/min via INTRAVENOUS

## 2019-12-08 MED ORDER — GLYCOPYRROLATE 0.2 MG/ML IJ SOLN
INTRAMUSCULAR | Status: AC
  Filled 2019-12-08: qty 1

## 2019-12-08 MED ORDER — OXYCODONE HCL 5 MG PO TABS
5.0000 mg | ORAL_TABLET | ORAL | Status: DC | PRN
  Administered 2019-12-08 – 2019-12-09 (×2): 10 mg via ORAL
  Filled 2019-12-08 (×3): qty 2

## 2019-12-08 MED ORDER — LACTATED RINGERS IV SOLN: INTRAVENOUS | Status: DC

## 2019-12-08 MED ORDER — MAGNESIUM HYDROXIDE 400 MG/5ML PO SUSP
30.0000 mL | Freq: Every day | ORAL | Status: DC
  Administered 2019-12-08 – 2019-12-10 (×3): 30 mL via ORAL
  Filled 2019-12-08 (×3): qty 30

## 2019-12-08 MED ORDER — SENNOSIDES-DOCUSATE SODIUM 8.6-50 MG PO TABS
1.0000 | ORAL_TABLET | Freq: Two times a day (BID) | ORAL | Status: DC
  Administered 2019-12-08 – 2019-12-10 (×4): 1 via ORAL
  Filled 2019-12-08 (×5): qty 1

## 2019-12-08 MED ORDER — DEXAMETHASONE SODIUM PHOSPHATE 10 MG/ML IJ SOLN: 8.0000 mg | Freq: Once | INTRAMUSCULAR | Status: AC

## 2019-12-08 MED ORDER — CELECOXIB 200 MG PO CAPS
200.0000 mg | ORAL_CAPSULE | Freq: Two times a day (BID) | ORAL | Status: DC
  Administered 2019-12-08 – 2019-12-10 (×4): 200 mg via ORAL
  Filled 2019-12-08 (×4): qty 1

## 2019-12-08 SURGICAL SUPPLY — 79 items
ATTUNE MED DOME PAT 38 KNEE (Knees) ×1 IMPLANT
ATTUNE MED DOME PAT 38MM KNEE (Knees) ×1 IMPLANT
ATTUNE PS FEM LT SZ 7 CEM KNEE (Femur) ×2 IMPLANT
ATTUNE PSRP INSR SZ7 5 KNEE (Insert) ×1 IMPLANT
ATTUNE PSRP INSR SZ7 5MM KNEE (Insert) ×1 IMPLANT
BASE TIBIAL ROT PLAT SZ 7 KNEE (Knees) IMPLANT
BATTERY INSTRU NAVIGATION (MISCELLANEOUS) ×12 IMPLANT
BLADE SAW 70X12.5 (BLADE) ×3 IMPLANT
BLADE SAW 90X13X1.19 OSCILLAT (BLADE) ×3 IMPLANT
BLADE SAW 90X25X1.19 OSCILLAT (BLADE) ×3 IMPLANT
BONE CEMENT GENTAMICIN (Cement) ×6 IMPLANT
CANISTER PREVENA PLUS 150 (CANNISTER) ×3 IMPLANT
CANISTER SUCT 3000ML PPV (MISCELLANEOUS) ×3 IMPLANT
CEMENT BONE GENTAMICIN 40 (Cement) IMPLANT
COOLER POLAR GLACIER W/PUMP (MISCELLANEOUS) ×3 IMPLANT
COVER WAND RF STERILE (DRAPES) ×3 IMPLANT
CUFF TOURN SGL QUICK 24 (TOURNIQUET CUFF) ×2
CUFF TOURN SGL QUICK 30 (TOURNIQUET CUFF)
CUFF TRNQT CYL 24X4X16.5-23 (TOURNIQUET CUFF) IMPLANT
CUFF TRNQT CYL 30X4X21-28X (TOURNIQUET CUFF) IMPLANT
DRAPE 3/4 80X56 (DRAPES) ×3 IMPLANT
DRSG DERMACEA 8X12 NADH (GAUZE/BANDAGES/DRESSINGS) ×3 IMPLANT
DRSG MEPILEX SACRM 8.7X9.8 (GAUZE/BANDAGES/DRESSINGS) ×3 IMPLANT
DRSG OPSITE POSTOP 4X14 (GAUZE/BANDAGES/DRESSINGS) ×3 IMPLANT
DRSG TEGADERM 4X4.75 (GAUZE/BANDAGES/DRESSINGS) ×3 IMPLANT
DURAPREP 26ML APPLICATOR (WOUND CARE) ×6 IMPLANT
ELECT REM PT RETURN 9FT ADLT (ELECTROSURGICAL) ×3
ELECTRODE REM PT RTRN 9FT ADLT (ELECTROSURGICAL) ×1 IMPLANT
EX-PIN ORTHOLOCK NAV 4X150 (PIN) ×6 IMPLANT
GLOVE BIO SURGEON STRL SZ7.5 (GLOVE) ×6 IMPLANT
GLOVE BIOGEL M STRL SZ7.5 (GLOVE) ×6 IMPLANT
GLOVE BIOGEL PI IND STRL 7.5 (GLOVE) ×1 IMPLANT
GLOVE BIOGEL PI INDICATOR 7.5 (GLOVE) ×2
GLOVE INDICATOR 8.0 STRL GRN (GLOVE) ×3 IMPLANT
GOWN STRL REUS W/ TWL LRG LVL3 (GOWN DISPOSABLE) ×2 IMPLANT
GOWN STRL REUS W/ TWL XL LVL3 (GOWN DISPOSABLE) ×1 IMPLANT
GOWN STRL REUS W/TWL LRG LVL3 (GOWN DISPOSABLE) ×4
GOWN STRL REUS W/TWL XL LVL3 (GOWN DISPOSABLE) ×2
HEMOVAC 400CC 10FR (MISCELLANEOUS) ×3 IMPLANT
HOLDER FOLEY CATH W/STRAP (MISCELLANEOUS) ×3 IMPLANT
HOOD PEEL AWAY FLYTE STAYCOOL (MISCELLANEOUS) ×6 IMPLANT
IRRIGATION SURGIPHOR STRL (IV SOLUTION) ×3 IMPLANT
KIT PREVENA INCISION MGT20CM45 (CANNISTER) ×3 IMPLANT
KIT PUMP PREVENA PLUS 14DAY (MISCELLANEOUS) ×3 IMPLANT
KIT TURNOVER KIT A (KITS) ×3 IMPLANT
KNIFE SCULPS 14X20 (INSTRUMENTS) ×3 IMPLANT
LABEL OR SOLS (LABEL) ×3 IMPLANT
MANIFOLD NEPTUNE II (INSTRUMENTS) ×6 IMPLANT
NDL SAFETY ECLIPSE 18X1.5 (NEEDLE) ×1 IMPLANT
NDL SPNL 20GX3.5 QUINCKE YW (NEEDLE) ×2 IMPLANT
NEEDLE HYPO 18GX1.5 SHARP (NEEDLE) ×2
NEEDLE SPNL 20GX3.5 QUINCKE YW (NEEDLE) ×6 IMPLANT
NS IRRIG 500ML POUR BTL (IV SOLUTION) ×3 IMPLANT
PACK TOTAL KNEE (MISCELLANEOUS) ×3 IMPLANT
PAD WRAPON POLAR KNEE (MISCELLANEOUS) ×1 IMPLANT
PENCIL SMOKE ULTRAEVAC 22 CON (MISCELLANEOUS) ×3 IMPLANT
PIN FIXATION 1/8DIA X 3INL (PIN) ×9 IMPLANT
PULSAVAC PLUS IRRIG FAN TIP (DISPOSABLE) ×3
SOL .9 NS 3000ML IRR  AL (IV SOLUTION) ×2
SOL .9 NS 3000ML IRR UROMATIC (IV SOLUTION) ×1 IMPLANT
SOL PREP PVP 2OZ (MISCELLANEOUS) ×3
SOLUTION PREP PVP 2OZ (MISCELLANEOUS) ×1 IMPLANT
SPONGE DRAIN TRACH 4X4 STRL 2S (GAUZE/BANDAGES/DRESSINGS) ×3 IMPLANT
STAPLER SKIN PROX 35W (STAPLE) ×3 IMPLANT
STOCKINETTE IMPERV 14X48 (MISCELLANEOUS) ×2 IMPLANT
STRAP TIBIA SHORT (MISCELLANEOUS) ×3 IMPLANT
SUCTION FRAZIER HANDLE 10FR (MISCELLANEOUS) ×2
SUCTION TUBE FRAZIER 10FR DISP (MISCELLANEOUS) ×1 IMPLANT
SUT VIC AB 0 CT1 36 (SUTURE) ×6 IMPLANT
SUT VIC AB 1 CT1 36 (SUTURE) ×6 IMPLANT
SUT VIC AB 2-0 CT2 27 (SUTURE) ×3 IMPLANT
SYR 20ML LL LF (SYRINGE) ×3 IMPLANT
SYR 30ML LL (SYRINGE) ×6 IMPLANT
TIBIAL BASE ROT PLAT SZ 7 KNEE (Knees) ×3 IMPLANT
TIP FAN IRRIG PULSAVAC PLUS (DISPOSABLE) ×1 IMPLANT
TOWEL OR 17X26 4PK STRL BLUE (TOWEL DISPOSABLE) ×3 IMPLANT
TOWER CARTRIDGE SMART MIX (DISPOSABLE) ×3 IMPLANT
TRAY FOLEY MTR SLVR 16FR STAT (SET/KITS/TRAYS/PACK) ×3 IMPLANT
WRAPON POLAR PAD KNEE (MISCELLANEOUS) ×3

## 2019-12-08 NOTE — OR Nursing (Signed)
Instructed on use of incentive spirometer with return demonstration. 

## 2019-12-08 NOTE — Op Note (Signed)
OPERATIVE NOTE  DATE OF SURGERY:  12/08/2019  PATIENT NAME:  Kyle Wells   DOB: September 18, 1933  MRN: [id number]  PRE-OPERATIVE DIAGNOSIS: Degenerative arthrosis of the left knee, primary  POST-OPERATIVE DIAGNOSIS:  Same  PROCEDURE:  Left total knee arthroplasty using computer-assisted navigation  SURGEON:  Jena Gauss. M.D.  ASSISTANT: Baldwin Jamaica, PA-C (present and scrubbed throughout the case, critical for assistance with exposure, retraction, instrumentation, and closure)  ANESTHESIA: spinal  ESTIMATED BLOOD LOSS: 50 mL  FLUIDS REPLACED: 800 mL of crystalloid  TOURNIQUET TIME: 95 minutes  DRAINS: 2 medium Hemovac  SOFT TISSUE RELEASES: Anterior cruciate ligament, posterior cruciate ligament, deep medial collateral ligament, patellofemoral ligament  IMPLANTS UTILIZED: DePuy Attune size 7 posterior stabilized femoral component (cemented), size 7 rotating platform tibial component (cemented), 38 mm medialized dome patella (cemented), and a 5 mm stabilized rotating platform polyethylene insert.  INDICATIONS FOR SURGERY: Kyle Wells is a 86 y.o. year old male with a long history of progressive knee pain. X-rays demonstrated severe degenerative changes in tricompartmental fashion. The patient had not seen any significant improvement despite conservative nonsurgical intervention. After discussion of the risks and benefits of surgical intervention, the patient expressed understanding of the risks benefits and agree with plans for total knee arthroplasty.   The risks, benefits, and alternatives were discussed at length including but not limited to the risks of infection, bleeding, nerve injury, stiffness, blood clots, the need for revision surgery, cardiopulmonary complications, among others, and they were willing to proceed.  PROCEDURE IN DETAIL: The patient was brought into the operating room and, after adequate spinal anesthesia was achieved, a tourniquet was placed on the  patient's upper thigh. The patient's knee and leg were cleaned and prepped with alcohol and DuraPrep and draped in the usual sterile fashion. A "timeout" was performed as per usual protocol. The lower extremity was exsanguinated using an Esmarch, and the tourniquet was inflated to 300 mmHg. An anterior longitudinal incision was made followed by a standard mid vastus approach. The deep fibers of the medial collateral ligament were elevated in a subperiosteal fashion off of the medial flare of the tibia so as to maintain a continuous soft tissue sleeve. The patella was subluxed laterally and the patellofemoral ligament was incised. Inspection of the knee demonstrated severe degenerative changes with full-thickness loss of articular cartilage. Osteophytes were debrided using a rongeur. Anterior and posterior cruciate ligaments were excised. Two 4.0 mm Schanz pins were inserted in the femur and into the tibia for attachment of the array of trackers used for computer-assisted navigation. Hip center was identified using a circumduction technique. Distal landmarks were mapped using the computer. The distal femur and proximal tibia were mapped using the computer. The distal femoral cutting guide was positioned using computer-assisted navigation so as to achieve a 5 distal valgus cut. The femur was sized and it was felt that a size 7 femoral component was appropriate. A size 7 femoral cutting guide was positioned and the anterior cut was performed and verified using the computer. This was followed by completion of the posterior and chamfer cuts. Femoral cutting guide for the central box was then positioned in the center box cut was performed.  Attention was then directed to the proximal tibia. Medial and lateral menisci were excised. The extramedullary tibial cutting guide was positioned using computer-assisted navigation so as to achieve a 0 varus-valgus alignment and 3 posterior slope. The cut was performed and  verified using the computer. The proximal tibia was sized  and it was felt that a size 7 tibial tray was appropriate. Tibial and femoral trials were inserted followed by insertion of a 5 mm polyethylene insert. This allowed for excellent mediolateral soft tissue balancing both in flexion and in full extension. Finally, the patella was cut and prepared so as to accommodate a 38 mm medialized dome patella. A patella trial was placed and the knee was placed through a range of motion with excellent patellar tracking appreciated. The femoral trial was removed after debridement of posterior osteophytes. The central post-hole for the tibial component was reamed followed by insertion of a keel punch. Tibial trials were then removed. Cut surfaces of bone were irrigated with copious amounts of normal saline using pulsatile lavage and then suctioned dry. Polymethylmethacrylate cement with gentamicin was prepared in the usual fashion using a vacuum mixer. Cement was applied to the cut surface of the proximal tibia as well as along the undersurface of a size 7 rotating platform tibial component. Tibial component was positioned and impacted into place. Excess cement was removed using Personal assistant. Cement was then applied to the cut surfaces of the femur as well as along the posterior flanges of the size 7 femoral component. The femoral component was positioned and impacted into place. Excess cement was removed using Personal assistant. A 5 mm polyethylene trial was inserted and the knee was brought into full extension with steady axial compression applied. Finally, cement was applied to the backside of a 38 mm medialized dome patella and the patellar component was positioned and patellar clamp applied. Excess cement was removed using Personal assistant. After adequate curing of the cement, the tourniquet was deflated after a total tourniquet time of 95 minutes. Hemostasis was achieved using electrocautery. The knee was irrigated with  copious amounts of normal saline using pulsatile lavage followed by 500 ml of Surgiphor and then suctioned dry. 20 mL of 1.3% Exparel and 60 mL of 0.25% Marcaine in 40 mL of normal saline was injected along the posterior capsule, medial and lateral gutters, and along the arthrotomy site. A 5  mm stabilized rotating platform polyethylene insert was inserted and the knee was placed through a range of motion with excellent mediolateral soft tissue balancing appreciated and excellent patellar tracking noted. 2 medium drains were placed in the wound bed and brought out through separate stab incisions. The medial parapatellar portion of the incision was reapproximated using interrupted sutures of #1 Vicryl. Subcutaneous tissue was approximated in layers using first #0 Vicryl followed #2-0 Vicryl. The skin was approximated with skin staples. A sterile dressing was applied.  The patient tolerated the procedure well and was transported to the recovery room in stable condition.    Tiegan P. Angie Fava., M.D.

## 2019-12-08 NOTE — Anesthesia Preprocedure Evaluation (Signed)
Anesthesia Evaluation  Patient identified by MRN, date of birth, ID band Patient awake    Reviewed: Allergy & Precautions, H&P , NPO status , Patient's Chart, lab work & pertinent test results, reviewed documented beta blocker date and time   Airway Mallampati: II   Neck ROM: full    Dental  (+) Poor Dentition   Pulmonary asthma , sleep apnea and Continuous Positive Airway Pressure Ventilation , COPD, Current Smoker and Patient abstained from smoking.,    Pulmonary exam normal        Cardiovascular Exercise Tolerance: Poor negative cardio ROS Normal cardiovascular exam Rhythm:regular Rate:Normal     Neuro/Psych negative neurological ROS  negative psych ROS   GI/Hepatic negative GI ROS, Neg liver ROS,   Endo/Other  negative endocrine ROS  Renal/GU negative Renal ROS  negative genitourinary   Musculoskeletal   Abdominal   Peds  Hematology negative hematology ROS (+)   Anesthesia Other Findings Past Medical History: No date: Arthritis No date: Asthma No date: BPH (benign prostatic hyperplasia) No date: COPD (chronic obstructive pulmonary disease) (HCC) No date: Hypercholesterolemia No date: Impingement syndrome of right shoulder No date: OSA (obstructive sleep apnea) No date: Small bowel obstruction (HCC) Past Surgical History: No date: COLONOSCOPY 11/26/2019: IVC FILTER INSERTION; N/A     Comment:  Procedure: IVC FILTER INSERTION;  Surgeon: Renford Dills, MD;  Location: [facility] INVASIVE CV LAB;  Service:              Cardiovascular;  Laterality: N/A; No date: PROSTATE BIOPSY   Reproductive/Obstetrics negative OB ROS                             Anesthesia Physical Anesthesia Plan  ASA: III  Anesthesia Plan: Spinal   Post-op Pain Management:    Induction:   PONV Risk Score and Plan:   Airway Management Planned:   Additional Equipment:   Intra-op Plan:    Post-operative Plan:   Informed Consent: I have reviewed the patients History and Physical, chart, labs and discussed the procedure including the risks, benefits and alternatives for the proposed anesthesia with the patient or authorized representative who has indicated his/her understanding and acceptance.     Dental Advisory Given  Plan Discussed with: CRNA  Anesthesia Plan Comments:         Anesthesia Quick Evaluation

## 2019-12-08 NOTE — Transfer of Care (Signed)
Immediate Anesthesia Transfer of Care Note  Patient: Kyle Wells  Procedure(s) Performed: COMPUTER ASSISTED TOTAL KNEE ARTHROPLASTY (Left Knee)  Patient Location: PACU  Anesthesia Type:General  Level of Consciousness: drowsy  Airway & Oxygen Therapy: Patient Spontanous Breathing and Patient connected to face mask oxygen  Post-op Assessment: Report given to RN and Post -op Vital signs reviewed and stable  Post vital signs: Reviewed and stable  Last Vitals:  Vitals Value Taken Time  BP 110/71 12/08/19 1535  Temp    Pulse 86 12/08/19 1537  Resp 25 12/08/19 1537  SpO2 96 % 12/08/19 1537  Vitals shown include unvalidated device data.  Last Pain:  Vitals:   12/08/19 0956  TempSrc: Temporal  PainSc: 8          Complications: No complications documented.

## 2019-12-08 NOTE — H&P (Signed)
The patient has been re-examined, and the chart reviewed, and there have been no interval changes to the documented history and physical.    The risks, benefits, and alternatives have been discussed at length. The patient expressed understanding of the risks benefits and agreed with plans for surgical intervention.  Faruq P. Kischa Altice, Jr. M.D.    

## 2019-12-08 NOTE — Anesthesia Procedure Notes (Signed)
Spinal  Patient location during procedure: OR Start time: 12/08/2019 11:52 AM End time: 12/08/2019 12:05 PM Staffing Performed: anesthesiologist  Anesthesiologist: Yevette Edwards, MD Resident/CRNA: Jeanine Luz, CRNA Preanesthetic Checklist Completed: patient identified, IV checked, site marked, risks and benefits discussed, surgical consent, monitors and equipment checked and pre-op evaluation Spinal Block Patient position: sitting Prep: ChloraPrep Patient monitoring: heart rate, continuous pulse ox and blood pressure Approach: left paramedian Location: L5-S1 Injection technique: single-shot Needle Needle type: Quincke  Needle gauge: 22 G Needle length: 9 cm Assessment Sensory level: T4 Additional Notes CRNA attemped X 2 using pencan and midline approach. Dr. Pernell Dupre successful using left paramedian approach via quincke.

## 2019-12-08 NOTE — Anesthesia Procedure Notes (Signed)
Procedure Name: MAC Date/Time: 12/08/2019 12:04 PM Performed by: Lily Peer, Damarea Merkel, CRNA Pre-anesthesia Checklist: Patient identified, Emergency Drugs available, Suction available and Patient being monitored Patient Re-evaluated:Patient Re-evaluated prior to induction Oxygen Delivery Method: Simple face mask

## 2019-12-09 ENCOUNTER — Encounter: Payer: Self-pay | Admitting: Orthopedic Surgery

## 2019-12-09 NOTE — Evaluation (Addendum)
Physical Therapy Evaluation Patient Details Name: Kyle Wells MRN: [id number] DOB: Jan 26, 1934 Today's Date: 12/09/2019   History of Present Illness  Patient is an 86 year old male with Degenerative arthrosis of the left knee, now status post left total knee arthroplasty. Patient has past medical history significant for obstructive sleep apnea, COPD, pulmonary embolus with IVC filter in place, hypercholesterolemia  Clinical Impression  PT evaluation completed. Patient is pleasant and motivated to participate with therapy. Patient required supervision for sit to stand transfers. Gait training with rolling walker with Min guard assistance provided during ambulation with gait distance limited by fatigue with activity. Patient was mildly short of breath after activity with Sp02 94% on room air. Patient reports he has some shortness of breath at baseline with activity. Patient participated with left leg exercises for strengthening/ROM in seated position in chair. Patient is hopeful to be discharged home with assistance from family as needed. Recommend to continue PT to address functional limitations listed below to maximize independence in preparation for discharge home. HHPT is recommended at discharge.     Follow Up Recommendations Home health PT;Supervision - Intermittent    Equipment Recommendations  None recommended by PT    Recommendations for Other Services       Precautions / Restrictions Precautions Precautions: Fall;Knee Precaution Booklet Issued: Yes (comment) Precaution Comments: Total Knee Home Exercise Packet issued Required Braces or Orthoses:  (No knee immobilizer needed, patient SLR independently ) Restrictions Weight Bearing Restrictions: Yes RLE Weight Bearing: Weight bearing as tolerated LLE Weight Bearing: Weight bearing as tolerated      Mobility  Bed Mobility               General bed mobility comments: Did not assess bed mobility this session as patient  sitting up on arrival to room and post session.     Transfers Overall transfer level: Needs assistance Equipment used: Rolling walker (2 wheeled) Transfers: Sit to/from Stand Sit to Stand: Supervision         General transfer comment: Verbal cues for safe technique, hand placement for safety.   Ambulation/Gait Ambulation/Gait assistance: Min guard Gait Distance (Feet): 75 feet approximately  Assistive device: Rolling walker (2 wheeled) Gait Pattern/deviations: Step-to pattern;Decreased stance time - left;Decreased step length - left;Antalgic Gait velocity: decreased    General Gait Details: Verbal cues for rolling walker placement and safety. Patient limits gait distance due to fatigue. Mild shortness of breath observed after walking, Sp02 95% on room air and pulse 68bpm.   Stairs            Wheelchair Mobility    Modified Rankin (Stroke Patients Only)       Balance Overall balance assessment: Needs assistance Sitting-balance support: Feet supported;No upper extremity supported Sitting balance-Leahy Scale: Good Sitting balance - Comments: Patient able to reach outside base of support with no trunk sway or loss of balance    Standing balance support: Bilateral upper extremity supported;During functional activity Standing balance-Leahy Scale: Fair Standing balance comment: Patient relying on rolling walker for support in standing position with functional activity.                              Pertinent Vitals/Pain Pain Assessment: 0-10 Pain Score: 3  Pain Location: left knee  Pain Descriptors / Indicators: Sore Pain Intervention(s): Monitored during session;Limited activity within patient's tolerance;Other (comment) (polar care re-applied at end of session left knee )    Home  Living Family/patient expects to be discharged to:: Private residence Living Arrangements: Alone Available Help at Discharge: Family;Available 24 hours/day (daughter will be  staying for a month per patient report ) Type of Home: House Home Access: Level entry     Home Layout: One level Home Equipment: Walker - 2 wheels;Cane - single point;Shower seat      Prior Function Level of Independence: Independent with assistive device(s)         Comments: Patient reports using rolling walker inside the home and cane in the community. Patient completes ADLs independently and drives      Hand Dominance        Extremity/Trunk Assessment   Upper Extremity Assessment Upper Extremity Assessment: Overall WFL for tasks assessed    Lower Extremity Assessment Lower Extremity Assessment: RLE deficits/detail;LLE deficits/detail RLE Deficits / Details: strength and AROM WNL for functional activity with observation during mobility  RLE Sensation: WNL LLE Deficits / Details: Patient able to complete SLR x 10 reps independently, patient activates ankle and hip ROM in antigravity position. No knee buckling with weight bearing  LLE Sensation: WNL       Communication   Communication: No difficulties  Cognition Arousal/Alertness: Awake/alert Behavior During Therapy: WFL for tasks assessed/performed Overall Cognitive Status: Within Functional Limits for tasks assessed                                        General Comments      Exercises Total Joint Exercises Ankle Circles/Pumps: AROM;Strengthening;Left;10 reps;Seated Quad Sets: AROM;Strengthening;Left;10 reps;Seated Straight Leg Raises: AROM;Strengthening;Left;10 reps;Seated Long Arc Quad: AAROM;Strengthening;Left;10 reps;Seated (AAROM to achieve end range of motion with extension ) Goniometric ROM: left knee AROM 6-92 degrees measured in sitting position    Assessment/Plan    PT Assessment Patient needs continued PT services  PT Problem List Decreased strength;Decreased range of motion;Decreased balance;Decreased activity tolerance;Decreased mobility;Decreased knowledge of use of  DME;Decreased safety awareness;Cardiopulmonary status limiting activity;Pain       PT Treatment Interventions DME instruction;Gait training;Stair training;Functional mobility training;Therapeutic activities;Therapeutic exercise;Balance training;Patient/family education    PT Goals (Current goals can be found in the Care Plan section)  Acute Rehab PT Goals Patient Stated Goal: to return home  PT Goal Formulation: With patient Time For Goal Achievement: 12/23/19 Potential to Achieve Goals: Good    Frequency BID   Barriers to discharge        Co-evaluation               AM-PAC PT "6 Clicks" Mobility  Outcome Measure Help needed turning from your back to your side while in a flat bed without using bedrails?: A Little Help needed moving from lying on your back to sitting on the side of a flat bed without using bedrails?: A Little Help needed moving to and from a bed to a chair (including a wheelchair)?: A Little Help needed standing up from a chair using your arms (e.g., wheelchair or bedside chair)?: A Little Help needed to walk in hospital room?: A Little Help needed climbing 3-5 steps with a railing? : A Little 6 Click Score: 18    End of Session Equipment Utilized During Treatment: Gait belt Activity Tolerance: Patient tolerated treatment well;Patient limited by fatigue Patient left: in chair;with call bell/phone within reach;with chair alarm set;with SCD's reapplied (polar care reapplied left knee, towel roll under left heel ) Nurse Communication: Mobility status (white board  was updated for current mobility status ) PT Visit Diagnosis: Difficulty in walking, not elsewhere classified (R26.2);Other abnormalities of gait and mobility (R26.89);Pain Pain - Right/Left: Left Pain - part of body: Knee    Time: 0823-0858 PT Time Calculation (min) (ACUTE ONLY): 35 min   Charges:   PT Evaluation $PT Eval Moderate Complexity: 1 Mod PT Treatments $Gait Training: 8-22  mins $Therapeutic Exercise: 8-22 mins        Donna Bernard, PT, MPT   Ina Homes 12/09/2019, 9:17 AM

## 2019-12-09 NOTE — Plan of Care (Signed)
Pt will remain free of infection Will have increase in activity tolerance Pain/comfort will improve Will remain free of injury

## 2019-12-09 NOTE — Plan of Care (Signed)
No Bone Foam in patient's room. Nursing notified that patient should have Bone Foam applied. -BS

## 2019-12-09 NOTE — Progress Notes (Signed)
ORTHOPAEDICS PROGRESS NOTE  PATIENT NAME: Kyle Wells DOB: Feb 15, 1933  MRN: [id number]  POD # 1: Left total knee arthroplasty  Subjective: The patient rested well last night.  He states that pain is been under good control.  He denies any nausea or vomiting. Nursing did not have the patient sit at bedside and dangle last night. The patient CPAP was on the countertop but was not set up last night.  Objective: Vital signs in last 24 hours: Temp:  [97.4 F (36.3 C)-98.5 F (36.9 C)] 97.8 F (36.6 C) (11/11 0422) Pulse Rate:  [60-90] 60 (11/11 0422) Resp:  [15-23] 18 (11/11 0422) BP: (105-133)/(66-90) 105/72 (11/11 0422) SpO2:  [89 %-99 %] 97 % (11/11 0422)  Intake/Output from previous day: 11/10 0701 - 11/11 0700 In: 2650.7 [P.O.:240; I.V.:1856.2; IV Piggyback:554.5] Out: 800 [Urine:500; Drains:250; Blood:50]  No results for input(s): WBC, HGB, HCT, PLT, K, CL, CO2, BUN, CREATININE, GLUCOSE, CALCIUM, LABPT, INR in the last 72 hours.  EXAM General: Well-developed well-nourished male seen in no apparent discomfort. Lungs: clear to auscultation Cardiac: normal rate and regular rhythm  Abdomen: Soft, nontender, nondistended.  Active bowel sounds are present. Left lower extremity: Dressing is dry and intact.  Polar Care and Hemovac drains are in place and functioning.  The patient is able to perform an independent straight leg raise.  Homans test is negative. Neurologic: Awake, alert, and oriented.  Sensory and motor function are intact.  Assessment: Left total knee arthroplasty  Secondary diagnoses: Obstructive sleep apnea Hypercholesterolemia COPD History of pulmonary embolus (IVC filter in place)  Plan: Today's goals were reviewed with the patient.  Begin physical therapy and Occupational Therapy as per total knee arthroplasty rehab protocol. Nursing has been notified about the CPAP. Plan is to go Home after hospital stay. DVT Prophylaxis - Xarelto, Foot Pumps and  TED hose  Rolan P. Angie Fava M.D.

## 2019-12-09 NOTE — TOC Initial Note (Signed)
Transition of Care [facility]) - Initial/Assessment Note    Patient Details  Name: Kyle Wells MRN: [id number] Date of Birth: 1933-08-15  Transition [facility]) CM/SW Contact:    Shelbie Ammons, RN Phone Number: 12/09/2019, 10:07 AM  Clinical Narrative:   RNCM met with patient in room, patient sitting up in recliner and reports to feeling well today aside from being a little bit sore. Patient lives at home independently and came into hospital for left total knee replacement.  Discussed with patient PT recommendations for home health after he returns home and he reports his daughter who he calls "his keeper" has already been contacted by [facility] and they are set up to see him when he returns home. Patient reports he does not need any equipment as he has all of this from his wife. Patient denies any other needs at this time.  RNCM reached out to [place] with [facility] and they will see patient when he gets home.               Expected Discharge Plan: [place] Barriers to Discharge: No Barriers Identified   Patient Goals and CMS Choice        Expected Discharge Plan and Services Expected Discharge Plan: [place] Choice: [place] arrangements for the past 2 months: Single Family Home                           HH Arranged: PT, OT [place] Agency: [facility] (formerly [employer]) Date [place]: 12/09/19 Time [place]: 1006 Representative spoke with at [place]: [place] Arrangements/Services Living arrangements for the past 2 months: [place] Lives with:: Self Patient language and need for interpreter reviewed:: Yes Do you feel safe going back to the place where you live?: Yes      Need for Family Participation in Patient Care: Yes (Comment) Care giver support system in place?: Yes (comment)   Criminal Activity/Legal Involvement Pertinent to Current  Situation/Hospitalization: No - Comment as needed  Activities of Daily Living Home Assistive Devices/Equipment: CPAP ADL Screening (condition at time of admission) Patient's cognitive ability adequate to safely complete daily activities?: Yes Is the patient deaf or have difficulty hearing?: No Does the patient have difficulty seeing, even when wearing glasses/contacts?: No Does the patient have difficulty concentrating, remembering, or making decisions?: No Patient able to express need for assistance with ADLs?: Yes Does the patient have difficulty dressing or bathing?: No Independently performs ADLs?: Yes (appropriate for developmental age) Does the patient have difficulty walking or climbing stairs?: Yes Weakness of Legs: Left Weakness of Arms/Hands: None  Permission Sought/Granted                  Emotional Assessment Appearance:: Appears stated age Attitude/Demeanor/Rapport: Engaged Affect (typically observed): Appropriate, Calm Orientation: : Oriented to Self, Oriented to Place, Oriented to  Time, Oriented to Situation Alcohol / Substance Use: Not Applicable Psych Involvement: No (comment)  Admission diagnosis:  Total knee replacement status [Z96.659] Patient Active Problem List   Diagnosis Date Noted  . Total knee replacement status 12/08/2019  . Multiple subsegmental pulmonary emboli without acute cor pulmonale ([place]) 06/14/2019  . Intractable nausea and vomiting 05/28/2019  . Primary osteoarthritis of both knees 11/03/2017  . SBO (small bowel obstruction) ([place]) 05/07/2017  . Small bowel obstruction ([place])  12/10/2016  . Healthcare maintenance 12/02/2016  . Nausea   . Obesity, unspecified 09/26/2016  . Obstructive sleep apnea on CPAP 11/02/2015  . Chronic obstructive pulmonary disease ([place])   . Intestinal obstruction ([place])   . Ileus ([place]) 09/19/2014  . Impingement syndrome of shoulder, right 05/13/2014  . Benign localized hyperplasia of prostate with urinary  obstruction and other lower urinary tract symptoms (LUTS)(600.21) 03/30/2014  . Hypercholesteremia 03/30/2014  . Sleep apnea 03/03/2012  . Benign localized prostatic hyperplasia with lower urinary tract symptoms (LUTS) 10/16/2011  . Elevated prostate specific antigen (PSA) 10/16/2011  . BPH (benign prostatic hyperplasia) 10/16/2011  . Incomplete emptying of bladder 10/16/2011  . Frequency of micturition 10/16/2011  . Retention of urine 10/16/2011  . Asthma with COPD ([place]) 05/31/2011  . Chronic obstructive asthma ([place]) 05/31/2011  . House dust mite allergy 10/20/2010  . Other allergy, other than to medicinal agents 10/20/2010   PCP:  Kirk Ruths, MD Pharmacy:   [id number] [address]Phone: [phone]: [phone]    Social Determinants of Health (SDOH) Interventions    Readmission Risk Interventions No flowsheet data found.

## 2019-12-09 NOTE — Anesthesia Postprocedure Evaluation (Signed)
Anesthesia Post Note  Patient: EMAN MORIMOTO  Procedure(s) Performed: COMPUTER ASSISTED TOTAL KNEE ARTHROPLASTY (Left Knee)  Patient location during evaluation: PACU Anesthesia Type: Spinal Level of consciousness: awake and alert Pain management: pain level controlled Vital Signs Assessment: post-procedure vital signs reviewed and stable Respiratory status: spontaneous breathing, nonlabored ventilation, respiratory function stable and patient connected to nasal cannula oxygen Cardiovascular status: blood pressure returned to baseline and stable Postop Assessment: no apparent nausea or vomiting Anesthetic complications: no   No complications documented.   Last Vitals:  Vitals:   12/09/19 0017 12/09/19 0422  BP: 106/69 105/72  Pulse: 66 60  Resp: 18 18  Temp: 36.5 C 36.6 C  SpO2: 96% 97%    Last Pain:  Vitals:   12/09/19 0422  TempSrc: Oral  PainSc:                  Yevette Edwards

## 2019-12-09 NOTE — Progress Notes (Signed)
Physical Therapy Treatment Patient Details Name: Kyle Wells MRN: [id number] DOB: Dec 04, 1933 Today's Date: 12/09/2019    History of Present Illness Patient is an 86 year old male with Degenerative arthrosis of the left knee, now status post left total knee arthroplasty. Patient has past medical history significant for obstructive sleep apnea, COPD, pulmonary embolus with IVC filter in place, hypercholesterolemia    PT Comments    Patient sitting up in chair on arrival to room and reports being in the chair since this morning. Two family members at the bedside. Patient reports 1/10 pain in left knee that does not worsen with activity. Patient is progressing with gait distance ambulated this session with cues for gait kinematics and safe technique with mobility. Patient participated in LE therapeutic exercises for strength and ROM in sitting position. Recommend to continue PT to maximize function and safety to facilitate independence. HHPT remains appropriate at this time.    Follow Up Recommendations  Home health PT;Supervision - Intermittent     Equipment Recommendations  None recommended by PT    Recommendations for Other Services       Precautions / Restrictions Precautions Precautions: Fall;Knee Precaution Booklet Issued: Yes (comment) Restrictions Weight Bearing Restrictions: Yes LLE Weight Bearing: Weight bearing as tolerated    Mobility  Bed Mobility Overal bed mobility: Needs Assistance Bed Mobility: Supine to Sit     Supine to sit: Supervision     General bed mobility comments: supervision for safety. verbal cues for positioning and technique   Transfers Overall transfer level: Needs assistance Equipment used: Rolling walker (2 wheeled) Transfers: Sit to/from Stand Sit to Stand: Supervision         General transfer comment: verbal cues for hand placement for safety   Ambulation/Gait Ambulation/Gait assistance: Min guard Gait Distance (Feet): 110  Feet Assistive device: Rolling walker (2 wheeled) Gait Pattern/deviations: Step-to pattern;Decreased stance time - left;Decreased step length - left;Antalgic Gait velocity: decreased   General Gait Details: verbal cues for improved gait kinematics with heel toe pattern and positioning of rolling walker for safety.    Stairs             Wheelchair Mobility    Modified Rankin (Stroke Patients Only)       Balance Overall balance assessment: Needs assistance Sitting-balance support: Feet supported;No upper extremity supported Sitting balance-Leahy Scale: Normal     Standing balance support: Bilateral upper extremity supported;During functional activity Standing balance-Leahy Scale: Fair Standing balance comment: Patient relying on rolling walker for support in standing position with functional activity.                             Cognition Arousal/Alertness: Awake/alert Behavior During Therapy: WFL for tasks assessed/performed Overall Cognitive Status: Within Functional Limits for tasks assessed                                        Exercises Total Joint Exercises Ankle Circles/Pumps: AROM;Strengthening;Both;10 reps;Seated Quad Sets: AROM;Strengthening;Left;10 reps;Seated Gluteal Sets: AROM;Strengthening;Both;10 reps;Seated Hip ABduction/ADduction: AAROM;Strengthening;Left;10 reps;Seated Straight Leg Raises: AROM;Strengthening;Left;10 reps;Seated Long Arc Quad: AAROM;Strengthening;Left;10 reps;Seated Other Exercises Other Exercises: verbal, tactile, and visual cues for exercise technique. educated patient again on positioning of LLE while in bed and avoid pillows behind the knee to promote knee extension     General Comments        Pertinent Vitals/Pain  Pain Assessment: 0-10 Pain Score: 1  Pain Location: left knee  Pain Descriptors / Indicators: Sore Pain Intervention(s): Monitored during session;Other (comment);Limited activity  within patient's tolerance (polar care reapplied at end of session )    Home Living Family/patient expects to be discharged to:: Private residence Living Arrangements: Alone Available Help at Discharge: Family;Available 24 hours/day Type of Home: House Home Access: Level entry     Home Equipment: Walker - 2 wheels;Cane - single point;Shower seat;Electric scooter      Prior Function Level of Independence: Independent with assistive device(s)      Comments: Patient reports using rolling walker inside the home and cane in the community. Patient completes ADLs independently and drives    PT Goals (current goals can now be found in the care plan section) Acute Rehab PT Goals Patient Stated Goal: to return home  PT Goal Formulation: With patient Time For Goal Achievement: 12/23/19 Potential to Achieve Goals: Good Progress towards PT goals: Progressing toward goals    Frequency    BID      PT Plan Current plan remains appropriate    Co-evaluation              AM-PAC PT "6 Clicks" Mobility   Outcome Measure  Help needed turning from your back to your side while in a flat bed without using bedrails?: None Help needed moving from lying on your back to sitting on the side of a flat bed without using bedrails?: A Little Help needed moving to and from a bed to a chair (including a wheelchair)?: A Little Help needed standing up from a chair using your arms (e.g., wheelchair or bedside chair)?: A Little Help needed to walk in hospital room?: A Little Help needed climbing 3-5 steps with a railing? : A Little 6 Click Score: 19    End of Session Equipment Utilized During Treatment: Gait belt Activity Tolerance: Patient tolerated treatment well;Patient limited by fatigue Patient left: in bed;with call bell/phone within reach;with bed alarm set;with SCD's reapplied;with family/visitor present (polar care reapplied ) Nurse Communication: Mobility status PT Visit Diagnosis:  Difficulty in walking, not elsewhere classified (R26.2);Other abnormalities of gait and mobility (R26.89);Pain Pain - Right/Left: Left Pain - part of body: Knee     Time: 9563-8756 PT Time Calculation (min) (ACUTE ONLY): 28 min  Charges:  $Gait Training: 8-22 mins $Therapeutic Exercise: 8-22 mins                     Donna Bernard, PT, MPT    [employer] 12/09/2019, 1:39 PM

## 2019-12-09 NOTE — Evaluation (Signed)
Occupational Therapy Evaluation Patient Details Name: Kyle Wells MRN: [id number] DOB: 13-Feb-1933 Today's Date: 12/09/2019    History of Present Illness Patient is an 86 year old male with Degenerative arthrosis of the left knee, now status post left total knee arthroplasty. Patient has past medical history significant for obstructive sleep apnea, COPD, pulmonary embolus with IVC filter in place, hypercholesterolemia   Clinical Impression   Pt seen for OT evaluation this date, POD#1 from above surgery. Pt was independent in all ADL/IADL prior to surgery, using a RW in the home and a cane out in the community, due to knee pain. Pt lives alone; however, his daughter will be staying with him as he recovers from surgery. Pt is eager to return to PLOF with less pain and improved safety and independence. Pt currently requires minimal assist for LB dressing while in seated position due to pain and limited AROM of L knee. Pt and daughter instructed in polar care mgt, falls prevention strategies, home/routines modifications, DME/AE for LB bathing and dressing tasks, and compression stocking mgt. They verbalized understanding and were able to provide teach-back/demonstration. No further OT required at this point.     Follow Up Recommendations  No OT follow up    Equipment Recommendations  None recommended by OT    Recommendations for Other Services       Precautions / Restrictions Precautions Precautions: Fall;Knee Precaution Booklet Issued: Yes (comment) Restrictions Weight Bearing Restrictions: Yes      Mobility Bed Mobility               General bed mobility comments: Did not assess bed mobility this session as patient sitting up on arrival to room and post session.     Transfers Overall transfer level: Needs assistance Equipment used: Rolling walker (2 wheeled) Transfers: Sit to/from Stand Sit to Stand: Supervision              Balance Overall balance assessment:  Needs assistance Sitting-balance support: Feet supported;No upper extremity supported Sitting balance-Leahy Scale: Normal     Standing balance support: Bilateral upper extremity supported;During functional activity Standing balance-Leahy Scale: Fair Standing balance comment: Patient relying on rolling walker for support in standing position with functional activity.                            ADL either performed or assessed with clinical judgement   ADL Overall ADL's : Modified independent                                       General ADL Comments: completes all ADL/IADL Ind'ly, using RW or cane; handicap-accessible home     Vision Patient Visual Report: No change from baseline       Perception     Praxis      Pertinent Vitals/Pain Pain Score: 1  Pain Location: left knee  Pain Intervention(s): Ice applied;Limited activity within patient's tolerance;Monitored during session     Hand Dominance     Extremity/Trunk Assessment Upper Extremity Assessment Upper Extremity Assessment: Overall WFL for tasks assessed   Lower Extremity Assessment Lower Extremity Assessment: LLE deficits/detail RLE Sensation: WNL LLE Sensation: WNL       Communication Communication Communication: No difficulties   Cognition Arousal/Alertness: Awake/alert Behavior During Therapy: WFL for tasks assessed/performed Overall Cognitive Status: Within Functional Limits for tasks assessed  General Comments       Exercises Other Exercises Other Exercises: Educ re: role of OT, POC, EC, polar care mgmt, TED hose mgmt, AE   Shoulder Instructions      Home Living Family/patient expects to be discharged to:: Private residence Living Arrangements: Alone Available Help at Discharge: Family;Available 24 hours/day Type of Home: House Home Access: Level entry           Bathroom Shower/Tub: Walk-in shower    Bathroom Toilet: Handicapped height Bathroom Accessibility: Yes   Home Equipment: Environmental consultant - 2 wheels;Cane - single point;Shower seat;Electric scooter          Prior Functioning/Environment Level of Independence: Independent with assistive device(s)        Comments: Patient reports using rolling walker inside the home and cane in the community. Patient completes ADLs independently and drives         OT Problem List: Impaired balance (sitting and/or standing);Decreased range of motion;Decreased activity tolerance;Decreased coordination;Decreased strength      OT Treatment/Interventions:      OT Goals(Current goals can be found in the care plan section) Acute Rehab OT Goals Patient Stated Goal: to return home  OT Goal Formulation: With patient Time For Goal Achievement: 12/23/19 Potential to Achieve Goals: Good  OT Frequency:     Barriers to D/C:            Co-evaluation              AM-PAC OT "6 Clicks" Daily Activity     Outcome Measure Help from another person eating meals?: None Help from another person taking care of personal grooming?: None Help from another person toileting, which includes using toliet, bedpan, or urinal?: A Little Help from another person bathing (including washing, rinsing, drying)?: A Little Help from another person to put on and taking off regular upper body clothing?: None Help from another person to put on and taking off regular lower body clothing?: A Little 6 Click Score: 21   End of Session Equipment Utilized During Treatment: Rolling walker  Activity Tolerance: Patient tolerated treatment well Patient left: in chair;with call bell/phone within reach;with chair alarm set;with family/visitor present  OT Visit Diagnosis: Unsteadiness on feet (R26.81);Other abnormalities of gait and mobility (R26.89);Muscle weakness (generalized) (M62.81)                Time: 5638-7564 OT Time Calculation (min): 48 min Charges:  OT General  Charges $OT Visit: 1 Visit OT Evaluation $OT Eval Low Complexity: 1 Low OT Treatments $Self Care/Home Management : 38-52 mins  Latina Craver, PhD, MS, OTR/L ascom [phone] 12/09/19, 12:21 PM

## 2019-12-10 LAB — CBC
HCT: 36 % — ABNORMAL LOW (ref 39.0–52.0)
Hemoglobin: 11.6 g/dL — ABNORMAL LOW (ref 13.0–17.0)
MCH: 28.4 pg (ref 26.0–34.0)
MCHC: 32.2 g/dL (ref 30.0–36.0)
MCV: 88.2 fL (ref 80.0–100.0)
Platelets: 188 10*3/uL (ref 150–400)
RBC: 4.08 MIL/uL — ABNORMAL LOW (ref 4.22–5.81)
RDW: 16.2 % — ABNORMAL HIGH (ref 11.5–15.5)
WBC: 7.5 10*3/uL (ref 4.0–10.5)
nRBC: 0 % (ref 0.0–0.2)

## 2019-12-10 MED ORDER — TRAMADOL HCL 50 MG PO TABS: 50.0000 mg | ORAL_TABLET | ORAL | 0 refills | Status: DC | PRN

## 2019-12-10 MED ORDER — CELECOXIB 200 MG PO CAPS: 200.0000 mg | ORAL_CAPSULE | Freq: Two times a day (BID) | ORAL | 0 refills | Status: DC

## 2019-12-10 MED ORDER — OXYCODONE HCL 5 MG PO TABS: 5.0000 mg | ORAL_TABLET | Freq: Four times a day (QID) | ORAL | 0 refills | Status: DC | PRN

## 2019-12-10 NOTE — Discharge Summary (Signed)
Physician Discharge Summary  Patient ID: Kyle Wells MRN: [id number] DOB/AGE: [birth year] 86 y.o.  Admit date: 12/08/2019 Discharge date: 12/10/2019  Admission Diagnoses:  Total knee replacement status [Z96.659]  Surgeries:Procedure(s):  Left total knee arthroplasty using computer-assisted navigation  SURGEON:  Jena Gauss. M.D.  ASSISTANT: Baldwin Jamaica, PA-C (present and scrubbed throughout the case, critical for assistance with exposure, retraction, instrumentation, and closure)  ANESTHESIA: spinal  ESTIMATED BLOOD LOSS: 50 mL  FLUIDS REPLACED: 800 mL of crystalloid  TOURNIQUET TIME: 95 minutes  DRAINS: 2 medium Hemovac  SOFT TISSUE RELEASES: Anterior cruciate ligament, posterior cruciate ligament, deep medial collateral ligament, patellofemoral ligament  IMPLANTS UTILIZED: DePuy Attune size 7 posterior stabilized femoral component (cemented), size 7 rotating platform tibial component (cemented), 38 mm medialized dome patella (cemented), and a 5 mm stabilized rotating platform polyethylene insert.  Discharge Diagnoses: Patient Active Problem List   Diagnosis Date Noted  . Total knee replacement status 12/08/2019  . Multiple subsegmental pulmonary emboli without acute cor pulmonale (HCC) 06/14/2019  . Intractable nausea and vomiting 05/28/2019  . Primary osteoarthritis of both knees 11/03/2017  . SBO (small bowel obstruction) (HCC) 05/07/2017  . Small bowel obstruction (HCC) 12/10/2016  . Healthcare maintenance 12/02/2016  . Nausea   . Obesity, unspecified 09/26/2016  . Obstructive sleep apnea on CPAP 11/02/2015  . Chronic obstructive pulmonary disease (HCC)   . Intestinal obstruction (HCC)   . Ileus (HCC) 09/19/2014  . Impingement syndrome of shoulder, right 05/13/2014  . Benign localized hyperplasia of prostate with urinary obstruction and other lower urinary tract symptoms (LUTS)(600.21) 03/30/2014  . Hypercholesteremia 03/30/2014  . Sleep apnea  03/03/2012  . Benign localized prostatic hyperplasia with lower urinary tract symptoms (LUTS) 10/16/2011  . Elevated prostate specific antigen (PSA) 10/16/2011  . BPH (benign prostatic hyperplasia) 10/16/2011  . Incomplete emptying of bladder 10/16/2011  . Frequency of micturition 10/16/2011  . Retention of urine 10/16/2011  . Asthma with COPD (HCC) 05/31/2011  . Chronic obstructive asthma (HCC) 05/31/2011  . House dust mite allergy 10/20/2010  . Other allergy, other than to medicinal agents 10/20/2010    Past Medical History:  Diagnosis Date  . Arthritis   . Asthma   . BPH (benign prostatic hyperplasia)   . COPD (chronic obstructive pulmonary disease) (HCC)   . Hypercholesterolemia   . Impingement syndrome of right shoulder   . OSA (obstructive sleep apnea)   . Small bowel obstruction (HCC)      Transfusion:    Consultants (if any):   Discharged Condition: Improved  Hospital Course: CORDNEY BARSTOW is an 86 y.o. male who was admitted 12/08/2019 with a diagnosis of left knee osteoarthritis and went to the operating room on 12/08/2019 and underwent left total knee arthroplasty. The patient received perioperative antibiotics for prophylaxis (see below). The patient tolerated the procedure well and was transported to PACU in stable condition. After meeting PACU criteria, the patient was subsequently transferred to the Orthopaedics/Rehabilitation unit.   The patient received DVT prophylaxis in the form of early mobilization, Xarelto, Foot Pumps and TED hose. A sacral pad had been placed and heels were elevated off of the bed with rolled towels in order to protect skin integrity. Foley catheter was discontinued on postoperative day #0. Wound drains were discontinued on postoperative day #2. The surgical incision was healing well without signs of infection.  Physical therapy was initiated postoperatively for transfers, gait training, and strengthening. Occupational therapy was initiated  for activities of daily living and evaluation  for assisted devices. Rehabilitation goals were reviewed in detail with the patient. The patient made steady progress with physical therapy and physical therapy recommended discharge to Home.   The patient achieved the preliminary goals of this hospitalization and was felt to be medically and orthopaedically appropriate for discharge.  He was given perioperative antibiotics:  Anti-infectives (From admission, onward)   Start     Dose/Rate Route Frequency Ordered Stop   12/08/19 1800  ceFAZolin (ANCEF) IVPB 2g/100 mL premix        2 g 200 mL/hr over 30 Minutes Intravenous Every 6 hours 12/08/19 1650 12/09/19 0135   12/08/19 1000  ceFAZolin (ANCEF) IVPB 2g/100 mL premix        2 g 200 mL/hr over 30 Minutes Intravenous On call to O.R. 12/08/19 6644 12/08/19 1225    .  Recent vital signs:  Vitals:   12/10/19 0418 12/10/19 0743  BP: 110/71 (!) 115/92  Pulse: (!) 56 72  Resp: 20 18  Temp: 97.7 F (36.5 C) 97.9 F (36.6 C)  SpO2: 98% 96%    Recent laboratory studies:  Recent Labs    12/10/19 0815  WBC 7.5  HGB 11.6*  HCT 36.0*  PLT 188    Diagnostic Studies: PERIPHERAL VASCULAR CATHETERIZATION  Result Date: 11/26/2019 See op note  DG Knee Left Port  Result Date: 12/08/2019 CLINICAL DATA:  86 year old male status post total left knee replacement. EXAM: PORTABLE LEFT KNEE - 1-2 VIEW COMPARISON:  None. FINDINGS: There is a total knee arthroplasty. The arthroplasty components appear intact and in anatomic alignment. There is no acute fracture or dislocation. The bones are osteopenic. No large effusion. Drainage catheter noted in the suprapatellar space. Anterior knee cutaneous surgical clips. IMPRESSION: Status post total knee arthroplasty. No acute fracture or dislocation. Electronically Signed   By: Elgie Collard M.D.   On: 12/08/2019 16:16    Discharge Medications:   Allergies as of 12/10/2019      Reactions   Zileuton Hives     [Onset: 10/27/2009]   Roflumilast Other (See Comments)   insomnia   [Onset: 11/29/2010] Other reaction(s): Other (See Comments) Insomnia      Medication List    TAKE these medications   acetaminophen 500 MG tablet Commonly known as: TYLENOL Take 1,000 mg by mouth in the morning and at bedtime.   Advair Diskus 500-50 MCG/DOSE Aepb Generic drug: Fluticasone-Salmeterol Inhale 1 puff into the lungs 2 (two) times daily.   albuterol 108 (90 Base) MCG/ACT inhaler Commonly known as: VENTOLIN HFA Inhale 2 puffs into the lungs every 4 (four) hours as needed for wheezing or shortness of breath.   albuterol (2.5 MG/3ML) 0.083% nebulizer solution Commonly known as: PROVENTIL Inhale 3 mLs into the lungs every 6 (six) hours as needed for wheezing or shortness of breath.   azelastine 0.1 % nasal spray Commonly known as: ASTELIN Place 2 sprays into both nostrils daily as needed for allergies.   celecoxib 200 MG capsule Commonly known as: CELEBREX Take 1 capsule (200 mg total) by mouth 2 (two) times daily.   docusate sodium 100 MG capsule Commonly known as: COLACE Take 100 mg by mouth at bedtime.   finasteride 5 MG tablet Commonly known as: PROSCAR Take 1 tablet (5 mg total) by mouth daily. What changed: when to take this   montelukast 10 MG tablet Commonly known as: SINGULAIR Take 10 mg by mouth at bedtime.   multivitamin capsule Take 1 capsule by mouth daily.   oxyCODONE 5  MG immediate release tablet Commonly known as: Oxy IR/ROXICODONE Take 1 tablet (5 mg total) by mouth every 6 (six) hours as needed for moderate pain (pain score 4-6).   polyethylene glycol 17 g packet Commonly known as: MIRALAX / GLYCOLAX Take 17 g by mouth daily. What changed: when to take this   Spiriva HandiHaler 18 MCG inhalation capsule Generic drug: tiotropium Place 18 mcg into inhaler and inhale daily as needed (Wheezing).   traMADol 50 MG tablet Commonly known as: ULTRAM Take 1 tablet (50  mg total) by mouth every 4 (four) hours as needed for moderate pain.   Xarelto 20 MG Tabs tablet Generic drug: rivaroxaban Take 20 mg by mouth daily.            Durable Medical Equipment  (From admission, onward)         Start     Ordered   12/08/19 1651  DME Walker rolling  Once       Question:  Patient needs a walker to treat with the following condition  Answer:  Total knee replacement status   12/08/19 1650   12/08/19 1651  DME Bedside commode  Once       Question:  Patient needs a bedside commode to treat with the following condition  Answer:  Total knee replacement status   12/08/19 1650          Disposition: home with home health PT  There are no questions and answers to display.           Follow-up Information    [facility] On 12/21/2019.   Specialty: Orthopedic Surgery Why: at 9:45am Contact information: [address] [facility] [address] [facility] [address] [facility] [place] [address] [phone]        Donato Heinz, MD On 01/20/2020.   Specialty: Orthopedic Surgery Why: at 1:30pm Contact information: [address] [facility] [place] [address] [phone]                Lasandra Beech, PA-C 12/10/2019, 10:50 AM

## 2019-12-10 NOTE — Progress Notes (Signed)
Was medicated earlier. With encouragement, patient agreed to have this writer apply his bone foam to left foot from 2200-0345.

## 2019-12-10 NOTE — Progress Notes (Signed)
Physical Therapy Treatment Patient Details Name: Kyle Wells MRN: [id number] DOB: [birth year] Today's Date: 12/10/2019    History of Present Illness Patient is an 86 year old male with Degenerative arthrosis of the left knee, now status post left total knee arthroplasty. Patient has past medical history significant for obstructive sleep apnea, COPD, pulmonary embolus with IVC filter in place, hypercholesterolemia    PT Comments    Pt was sitting in recliner upon arriving with daughter present. He was easily able to perform all desired task without difficulty. Performed stair training and was able to demonstrate safe performance. Pt is cleared from PT standpoint for safe DC home. Will continue to benefit from skilled PT at home to continue to address strength, ROM, and safety deficits. Recommend HHPT. Pt did have successful BM during session. He was sitting in recliner with BLEs elevated and towel roll under heel and polar care reapplied at conclusion of session.    Follow Up Recommendations  Home health PT     Equipment Recommendations  None recommended by PT    Recommendations for Other Services       Precautions / Restrictions Precautions Precautions: Fall;Knee Precaution Booklet Issued: Yes (comment) Precaution Comments: Total Knee Home Exercise Packet issued Restrictions Weight Bearing Restrictions: Yes RLE Weight Bearing: Weight bearing as tolerated LLE Weight Bearing: Weight bearing as tolerated    Mobility  Bed Mobility               General bed mobility comments: pt was in recliner pre/post session  Transfers Overall transfer level: Modified independent Equipment used: Rolling walker (2 wheeled) Transfers: Sit to/from Stand Sit to Stand: Modified independent (Device/Increase time)         General transfer comment: pt demonstrated safe technique to STS from recliner, EOB and low toilet height without assistance.   Ambulation/Gait Ambulation/Gait  assistance: Supervision;Modified independent (Device/Increase time) Gait Distance (Feet): 200 Feet Assistive device: Rolling walker (2 wheeled) Gait Pattern/deviations: Step-through pattern Gait velocity: WNL   General Gait Details: no LOB or difficulty with ambulation   Stairs Stairs: Yes Stairs assistance: Supervision Stair Management: No rails;Step to pattern;With walker Number of Stairs: 1 General stair comments: pt is able to safely ascend/descend 1 step without difficulty       Balance Overall balance assessment: Modified Independent       Cognition Arousal/Alertness: Awake/alert Behavior During Therapy: WFL for tasks assessed/performed Overall Cognitive Status: Within Functional Limits for tasks assessed         Exercises Total Joint Exercises Goniometric ROM: 94 degrees flexion, lack 4 degrees extension    General Comments General comments (skin integrity, edema, etc.): discussed car transfers, HEP, and positioning with pt/pt's daughter      Pertinent Vitals/Pain Pain Assessment: No/denies pain Pain Score: 0-No pain Pain Location: left knee  Pain Descriptors / Indicators: Sore Pain Intervention(s): Limited activity within patient's tolerance;Monitored during session;Premedicated before session;Repositioned;Ice applied           PT Goals (current goals can now be found in the care plan section) Acute Rehab PT Goals Patient Stated Goal: to return home  Progress towards PT goals: Progressing toward goals    Frequency    BID      PT Plan Current plan remains appropriate    Co-evaluation              AM-PAC PT "6 Clicks" Mobility   Outcome Measure  Help needed turning from your back to your side while in a flat bed  without using bedrails?: None Help needed moving from lying on your back to sitting on the side of a flat bed without using bedrails?: None Help needed moving to and from a bed to a chair (including a wheelchair)?: None Help  needed standing up from a chair using your arms (e.g., wheelchair or bedside chair)?: A Little Help needed to walk in hospital room?: A Little Help needed climbing 3-5 steps with a railing? : A Little 6 Click Score: 21    End of Session Equipment Utilized During Treatment: Gait belt Activity Tolerance: Patient tolerated treatment well Patient left: in chair;with call bell/phone within reach;with bed alarm set;with family/visitor present Nurse Communication: Mobility status PT Visit Diagnosis: Difficulty in walking, not elsewhere classified (R26.2);Other abnormalities of gait and mobility (R26.89);Pain Pain - Right/Left: Left Pain - part of body: Knee     Time: 5009-3818 PT Time Calculation (min) (ACUTE ONLY): 23 min  Charges:  $Gait Training: 8-22 mins $Therapeutic Activity: 8-22 mins                     Jetta Lout PTA 12/10/19, 11:09 AM

## 2019-12-10 NOTE — Care Management Important Message (Signed)
Important Message  Patient Details  Name: Kyle Wells MRN: [id number] Date of Birth: May 19, 1933   Medicare Important Message Given:  N/A - LOS <3 / Initial given by admissions     Olegario Messier A Jaelani Posa 12/10/2019, 7:45 AM

## 2019-12-10 NOTE — Plan of Care (Signed)
Discharge PPW reviewed and given to pt - pt and family voiced understanding of all meds and instructions.

## 2019-12-10 NOTE — Progress Notes (Signed)
  Subjective: 2 Days Post-Op Procedure(s) (LRB): COMPUTER ASSISTED TOTAL KNEE ARTHROPLASTY (Left) Patient reports pain as well-controlled.   Patient is well, and has had no acute complaints or problems Plan is to go Home after hospital stay. Negative for chest pain and shortness of breath Fever: no Gastrointestinal: negative for nausea and vomiting.  Patient has had a bowel movement.  Objective: Vital signs in last 24 hours: Temp:  [97.5 F (36.4 C)-98.2 F (36.8 C)] 97.9 F (36.6 C) (11/12 0743) Pulse Rate:  [56-73] 72 (11/12 0743) Resp:  [16-20] 18 (11/12 0743) BP: (105-131)/(54-92) 115/92 (11/12 0743) SpO2:  [93 %-98 %] 96 % (11/12 0743)  Intake/Output from previous day:  Intake/Output Summary (Last 24 hours) at 12/10/2019 0808 Last data filed at 12/10/2019 0624 Gross per 24 hour  Intake 600 ml  Output 1345 ml  Net -745 ml    Intake/Output this shift: No intake/output data recorded.  Labs: No results for input(s): HGB in the last 72 hours. No results for input(s): WBC, RBC, HCT, PLT in the last 72 hours. No results for input(s): NA, K, CL, CO2, BUN, CREATININE, GLUCOSE, CALCIUM in the last 72 hours. No results for input(s): LABPT, INR in the last 72 hours.   EXAM General - Patient is Alert, Appropriate and Oriented Extremity - Neurovascular intact Dorsiflexion/Plantar flexion intact Compartment soft Dressing/Incision -Postoperative dressing remains in place., Polar Care in place and working. , Hemovac in place. Following dressing removal, minimal sanguinous drainage noted Motor Function - intact, moving foot and toes well on exam.  Cardiovascular- Regular rate and rhythm, no murmurs/rubs/gallops Respiratory- Lungs clear to auscultation bilaterally Gastrointestinal- soft, nontender and hypoactive bowel sounds   Assessment/Plan: 2 Days Post-Op Procedure(s) (LRB): COMPUTER ASSISTED TOTAL KNEE ARTHROPLASTY (Left) Active Problems:   Total knee replacement  status  Estimated body mass index is 31.32 kg/m as calculated from the following:   Height as of 11/26/19: 5\' 7"  (1.702 m).   Weight as of 11/26/19: 90.7 kg. Advance diet Up with therapy Discharge home with home health  Compression dressing removed. Honeycomb dressing removed and changed. Mini compression dressing applied.   DVT Prophylaxis - Eloquis, Ted hose and foot pumps Weight-Bearing as tolerated to left leg  11/28/19, PA-C [facility] 12/10/2019, 8:08 AM

## 2019-12-10 NOTE — Plan of Care (Signed)
  Problem: Education: Goal: Knowledge of General Education information will improve Description: Including pain rating scale, medication(s)/side effects and non-pharmacologic comfort measures Outcome: Progressing   Problem: Health Behavior/Discharge Planning: Goal: Ability to manage health-related needs will improve Outcome: Progressing   Problem: Clinical Measurements: Goal: Ability to maintain clinical measurements within normal limits will improve Outcome: Progressing Goal: Will remain free from infection Outcome: Progressing Goal: Diagnostic test results will improve Outcome: Progressing Goal: Respiratory complications will improve Outcome: Progressing Goal: Cardiovascular complication will be avoided Outcome: Progressing   Problem: Nutrition: Goal: Adequate nutrition will be maintained Outcome: Progressing   Problem: Coping: Goal: Level of anxiety will decrease Outcome: Progressing   Problem: Elimination: Goal: Will not experience complications related to bowel motility Outcome: Progressing Goal: Will not experience complications related to urinary retention Outcome: Progressing   Problem: Pain Managment: Goal: General experience of comfort will improve Outcome: Progressing   Problem: Safety: Goal: Ability to remain free from injury will improve Outcome: Progressing   Problem: Education: Goal: Knowledge of the prescribed therapeutic regimen will improve Outcome: Progressing Goal: Individualized Educational Video(s) Outcome: Progressing   Problem: Activity: Goal: Ability to avoid complications of mobility impairment will improve Outcome: Progressing Goal: Range of joint motion will improve Outcome: Progressing   Problem: Clinical Measurements: Goal: Postoperative complications will be avoided or minimized Outcome: Progressing   Problem: Pain Management: Goal: Pain level will decrease with appropriate interventions Outcome: Progressing   Problem: Skin  Integrity: Goal: Will show signs of wound healing Outcome: Progressing

## 2019-12-20 ENCOUNTER — Encounter
Admission: EM | Disposition: A | Discharge status: Home / Self Care | Payer: Self-pay | Source: Home / Self Care | Attending: Emergency Medicine

## 2019-12-20 ENCOUNTER — Other Ambulatory Visit: Payer: Self-pay

## 2019-12-20 ENCOUNTER — Observation Stay: Payer: Medicare PPO | Admitting: Anesthesiology

## 2019-12-20 ENCOUNTER — Observation Stay
Admission: EM | Admit: 2019-12-20 | Discharge: 2019-12-21 | Disposition: A | Discharge status: Home / Self Care | Payer: Medicare PPO | Attending: Internal Medicine | Admitting: Internal Medicine

## 2019-12-20 ENCOUNTER — Encounter: Payer: Self-pay | Admitting: Radiology

## 2019-12-20 ENCOUNTER — Emergency Department: Payer: Medicare PPO

## 2019-12-20 DIAGNOSIS — Z20822 Contact with and (suspected) exposure to covid-19: Secondary | ICD-10-CM | POA: Diagnosis not present

## 2019-12-20 DIAGNOSIS — Z79899 Other long term (current) drug therapy: Secondary | ICD-10-CM | POA: Diagnosis not present

## 2019-12-20 DIAGNOSIS — N400 Enlarged prostate without lower urinary tract symptoms: Secondary | ICD-10-CM

## 2019-12-20 DIAGNOSIS — F17210 Nicotine dependence, cigarettes, uncomplicated: Secondary | ICD-10-CM | POA: Diagnosis not present

## 2019-12-20 DIAGNOSIS — K2101 Gastro-esophageal reflux disease with esophagitis, with bleeding: Secondary | ICD-10-CM | POA: Diagnosis not present

## 2019-12-20 DIAGNOSIS — Z7901 Long term (current) use of anticoagulants: Secondary | ICD-10-CM | POA: Diagnosis not present

## 2019-12-20 DIAGNOSIS — J449 Chronic obstructive pulmonary disease, unspecified: Secondary | ICD-10-CM | POA: Insufficient documentation

## 2019-12-20 DIAGNOSIS — Z96652 Presence of left artificial knee joint: Secondary | ICD-10-CM | POA: Insufficient documentation

## 2019-12-20 DIAGNOSIS — Z720 Tobacco use: Secondary | ICD-10-CM | POA: Diagnosis present

## 2019-12-20 DIAGNOSIS — D500 Iron deficiency anemia secondary to blood loss (chronic): Secondary | ICD-10-CM | POA: Diagnosis not present

## 2019-12-20 DIAGNOSIS — J45909 Unspecified asthma, uncomplicated: Secondary | ICD-10-CM | POA: Insufficient documentation

## 2019-12-20 DIAGNOSIS — K922 Gastrointestinal hemorrhage, unspecified: Principal | ICD-10-CM | POA: Insufficient documentation

## 2019-12-20 DIAGNOSIS — I2694 Multiple subsegmental pulmonary emboli without acute cor pulmonale: Secondary | ICD-10-CM | POA: Diagnosis present

## 2019-12-20 DIAGNOSIS — K920 Hematemesis: Secondary | ICD-10-CM | POA: Diagnosis present

## 2019-12-20 DIAGNOSIS — D649 Anemia, unspecified: Secondary | ICD-10-CM

## 2019-12-20 DIAGNOSIS — Z86711 Personal history of pulmonary embolism: Secondary | ICD-10-CM | POA: Diagnosis present

## 2019-12-20 DIAGNOSIS — E7800 Pure hypercholesterolemia, unspecified: Secondary | ICD-10-CM | POA: Diagnosis present

## 2019-12-20 HISTORY — PX: ESOPHAGOGASTRODUODENOSCOPY (EGD) WITH PROPOFOL: SHX5813

## 2019-12-20 LAB — CBC WITH DIFFERENTIAL/PLATELET
Abs Immature Granulocytes: 0.06 10*3/uL (ref 0.00–0.07)
Abs Immature Granulocytes: 0.08 10*3/uL — ABNORMAL HIGH (ref 0.00–0.07)
Basophils Absolute: 0 10*3/uL (ref 0.0–0.1)
Basophils Absolute: 0 10*3/uL (ref 0.0–0.1)
Basophils Relative: 0 %
Basophils Relative: 0 %
Eosinophils Absolute: 0 10*3/uL (ref 0.0–0.5)
Eosinophils Absolute: 0 10*3/uL (ref 0.0–0.5)
Eosinophils Relative: 0 %
Eosinophils Relative: 0 %
HCT: 30.8 % — ABNORMAL LOW (ref 39.0–52.0)
HCT: 32.1 % — ABNORMAL LOW (ref 39.0–52.0)
Hemoglobin: 9.9 g/dL — ABNORMAL LOW (ref 13.0–17.0)
Hemoglobin: 10.4 g/dL — ABNORMAL LOW (ref 13.0–17.0)
Immature Granulocytes: 1 %
Immature Granulocytes: 1 %
Lymphocytes Relative: 9 %
Lymphocytes Relative: 10 %
Lymphs Abs: 1 10*3/uL (ref 0.7–4.0)
Lymphs Abs: 1.2 10*3/uL (ref 0.7–4.0)
MCH: 28.9 pg (ref 26.0–34.0)
MCH: 29 pg (ref 26.0–34.0)
MCHC: 32.1 g/dL (ref 30.0–36.0)
MCHC: 32.4 g/dL (ref 30.0–36.0)
MCV: 89.8 fL (ref 80.0–100.0)
MCV: 89.4 fL (ref 80.0–100.0)
Monocytes Absolute: 0.8 10*3/uL (ref 0.1–1.0)
Monocytes Absolute: 0.7 10*3/uL (ref 0.1–1.0)
Monocytes Relative: 7 %
Monocytes Relative: 6 %
Neutro Abs: 9.3 10*3/uL — ABNORMAL HIGH (ref 1.7–7.7)
Neutro Abs: 9.6 10*3/uL — ABNORMAL HIGH (ref 1.7–7.7)
Neutrophils Relative %: 83 %
Neutrophils Relative %: 83 %
Platelets: 248 10*3/uL (ref 150–400)
Platelets: 275 10*3/uL (ref 150–400)
RBC: 3.43 MIL/uL — ABNORMAL LOW (ref 4.22–5.81)
RBC: 3.59 MIL/uL — ABNORMAL LOW (ref 4.22–5.81)
RDW: 16.1 % — ABNORMAL HIGH (ref 11.5–15.5)
RDW: 16 % — ABNORMAL HIGH (ref 11.5–15.5)
WBC: 11.1 10*3/uL — ABNORMAL HIGH (ref 4.0–10.5)
WBC: 11.7 10*3/uL — ABNORMAL HIGH (ref 4.0–10.5)
nRBC: 0 % (ref 0.0–0.2)
nRBC: 0 % (ref 0.0–0.2)

## 2019-12-20 LAB — COMPREHENSIVE METABOLIC PANEL
ALT: 12 U/L (ref 0–44)
AST: 16 U/L (ref 15–41)
Albumin: 2.8 g/dL — ABNORMAL LOW (ref 3.5–5.0)
Alkaline Phosphatase: 41 U/L (ref 38–126)
Anion gap: 8 (ref 5–15)
BUN: 24 mg/dL — ABNORMAL HIGH (ref 8–23)
CO2: 21 mmol/L — ABNORMAL LOW (ref 22–32)
Calcium: 7.4 mg/dL — ABNORMAL LOW (ref 8.9–10.3)
Chloride: 108 mmol/L (ref 98–111)
Creatinine, Ser: 0.72 mg/dL (ref 0.61–1.24)
GFR, Estimated: >60 mL/min (ref >60)
Glucose, Bld: 159 mg/dL — ABNORMAL HIGH (ref 70–99)
Potassium: 3.5 mmol/L (ref 3.5–5.1)
Sodium: 137 mmol/L (ref 135–145)
Total Bilirubin: 0.6 mg/dL (ref 0.3–1.2)
Total Protein: 5.8 g/dL — ABNORMAL LOW (ref 6.5–8.1)

## 2019-12-20 LAB — CBC
HCT: 30.3 % — ABNORMAL LOW (ref 39.0–52.0)
HCT: 31.2 % — ABNORMAL LOW (ref 39.0–52.0)
Hemoglobin: 9.9 g/dL — ABNORMAL LOW (ref 13.0–17.0)
Hemoglobin: 9.9 g/dL — ABNORMAL LOW (ref 13.0–17.0)
MCH: 29.3 pg (ref 26.0–34.0)
MCH: 28.9 pg (ref 26.0–34.0)
MCHC: 32.7 g/dL (ref 30.0–36.0)
MCHC: 31.7 g/dL (ref 30.0–36.0)
MCV: 89.6 fL (ref 80.0–100.0)
MCV: 91.2 fL (ref 80.0–100.0)
Platelets: 259 10*3/uL (ref 150–400)
Platelets: 226 10*3/uL (ref 150–400)
RBC: 3.38 MIL/uL — ABNORMAL LOW (ref 4.22–5.81)
RBC: 3.42 MIL/uL — ABNORMAL LOW (ref 4.22–5.81)
RDW: 16.3 % — ABNORMAL HIGH (ref 11.5–15.5)
RDW: 16.5 % — ABNORMAL HIGH (ref 11.5–15.5)
WBC: 12.2 10*3/uL — ABNORMAL HIGH (ref 4.0–10.5)
WBC: 11 10*3/uL — ABNORMAL HIGH (ref 4.0–10.5)
nRBC: 0 % (ref 0.0–0.2)
nRBC: 0 % (ref 0.0–0.2)

## 2019-12-20 LAB — PROTIME-INR
INR: 1.7 — ABNORMAL HIGH (ref 0.8–1.2)
Prothrombin Time: 19.3 seconds — ABNORMAL HIGH (ref 11.4–15.2)

## 2019-12-20 LAB — RESP PANEL BY RT-PCR (FLU A&B, COVID) ARPGX2
Influenza A by PCR: NEGATIVE
Influenza B by PCR: NEGATIVE
SARS Coronavirus 2 by RT PCR: NEGATIVE

## 2019-12-20 LAB — APTT: aPTT: 37 seconds — ABNORMAL HIGH (ref 24–36)

## 2019-12-20 LAB — TYPE AND SCREEN
ABO/RH(D): O POS
Antibody Screen: NEGATIVE

## 2019-12-20 SURGERY — ESOPHAGOGASTRODUODENOSCOPY (EGD) WITH PROPOFOL
Anesthesia: General

## 2019-12-20 MED ORDER — TIOTROPIUM BROMIDE MONOHYDRATE 18 MCG IN CAPS
18.0000 ug | ORAL_CAPSULE | Freq: Every day | RESPIRATORY_TRACT | Status: DC | PRN
  Filled 2019-12-20: qty 5

## 2019-12-20 MED ORDER — ONDANSETRON HCL 4 MG/2ML IJ SOLN
4.0000 mg | Freq: Once | INTRAMUSCULAR | Status: AC
  Administered 2019-12-20: 4 mg via INTRAVENOUS
  Filled 2019-12-20: qty 2

## 2019-12-20 MED ORDER — PANTOPRAZOLE SODIUM 40 MG IV SOLR: 40.0000 mg | Freq: Two times a day (BID) | INTRAVENOUS | Status: DC

## 2019-12-20 MED ORDER — SODIUM CHLORIDE 0.9 % IV SOLN
8.0000 mg/h | INTRAVENOUS | Status: DC
  Administered 2019-12-20 – 2019-12-21 (×3): 8 mg/h via INTRAVENOUS
  Filled 2019-12-20 (×3): qty 80

## 2019-12-20 MED ORDER — ALBUTEROL SULFATE HFA 108 (90 BASE) MCG/ACT IN AERS
2.0000 | INHALATION_SPRAY | RESPIRATORY_TRACT | Status: DC | PRN
  Filled 2019-12-20: qty 6.7

## 2019-12-20 MED ORDER — DM-GUAIFENESIN ER 30-600 MG PO TB12
1.0000 | ORAL_TABLET | Freq: Two times a day (BID) | ORAL | Status: DC | PRN
  Administered 2019-12-21: 1 via ORAL
  Filled 2019-12-20: qty 1

## 2019-12-20 MED ORDER — FINASTERIDE 5 MG PO TABS
5.0000 mg | ORAL_TABLET | Freq: Every day | ORAL | Status: DC
  Administered 2019-12-20: 5 mg via ORAL
  Filled 2019-12-20: qty 1

## 2019-12-20 MED ORDER — ONDANSETRON HCL 4 MG/2ML IJ SOLN
4.0000 mg | Freq: Three times a day (TID) | INTRAMUSCULAR | Status: DC | PRN
  Administered 2019-12-20: 4 mg via INTRAVENOUS
  Filled 2019-12-20: qty 2

## 2019-12-20 MED ORDER — DOCUSATE SODIUM 100 MG PO CAPS
100.0000 mg | ORAL_CAPSULE | Freq: Two times a day (BID) | ORAL | Status: DC | PRN
  Administered 2019-12-21: 100 mg via ORAL
  Filled 2019-12-20: qty 1

## 2019-12-20 MED ORDER — ACETAMINOPHEN 325 MG PO TABS
650.0000 mg | ORAL_TABLET | Freq: Four times a day (QID) | ORAL | Status: DC | PRN
  Administered 2019-12-20 – 2019-12-21 (×2): 650 mg via ORAL
  Filled 2019-12-20 (×2): qty 2

## 2019-12-20 MED ORDER — MONTELUKAST SODIUM 10 MG PO TABS
10.0000 mg | ORAL_TABLET | Freq: Every day | ORAL | Status: DC
  Administered 2019-12-20: 10 mg via ORAL
  Filled 2019-12-20: qty 1

## 2019-12-20 MED ORDER — POLYETHYLENE GLYCOL 3350 17 G PO PACK
17.0000 g | PACK | Freq: Every day | ORAL | Status: DC
  Administered 2019-12-20: 17 g via ORAL
  Filled 2019-12-20: qty 1

## 2019-12-20 MED ORDER — SODIUM CHLORIDE 0.9 % IV SOLN: INTRAVENOUS | Status: DC

## 2019-12-20 MED ORDER — MULTIVITAMINS PO CAPS: 1.0000 | ORAL_CAPSULE | Freq: Every day | ORAL | Status: DC

## 2019-12-20 MED ORDER — PANTOPRAZOLE SODIUM 40 MG IV SOLR
40.0000 mg | Freq: Once | INTRAVENOUS | Status: AC
  Administered 2019-12-20: 40 mg via INTRAVENOUS
  Filled 2019-12-20: qty 40

## 2019-12-20 MED ORDER — PROPOFOL 500 MG/50ML IV EMUL
INTRAVENOUS | Status: DC | PRN
  Administered 2019-12-20: 150 ug/kg/min via INTRAVENOUS

## 2019-12-20 MED ORDER — ADULT MULTIVITAMIN W/MINERALS CH
1.0000 | ORAL_TABLET | Freq: Every day | ORAL | Status: DC
  Administered 2019-12-21: 1 via ORAL
  Filled 2019-12-20: qty 1

## 2019-12-20 MED ORDER — PHENYLEPHRINE HCL (PRESSORS) 10 MG/ML IV SOLN
INTRAVENOUS | Status: DC | PRN
  Administered 2019-12-20: 100 ug via INTRAVENOUS

## 2019-12-20 MED ORDER — AZELASTINE HCL 0.1 % NA SOLN
2.0000 | Freq: Every day | NASAL | Status: DC | PRN
  Filled 2019-12-20: qty 30

## 2019-12-20 MED ORDER — NICOTINE 21 MG/24HR TD PT24
21.0000 mg | MEDICATED_PATCH | Freq: Every day | TRANSDERMAL | Status: DC
  Filled 2019-12-20: qty 1

## 2019-12-20 MED ORDER — OXYCODONE HCL 5 MG PO TABS
5.0000 mg | ORAL_TABLET | Freq: Four times a day (QID) | ORAL | Status: DC | PRN
  Administered 2019-12-20 – 2019-12-21 (×3): 5 mg via ORAL
  Filled 2019-12-20 (×3): qty 1

## 2019-12-20 MED ORDER — MOMETASONE FURO-FORMOTEROL FUM 200-5 MCG/ACT IN AERO
2.0000 | INHALATION_SPRAY | Freq: Two times a day (BID) | RESPIRATORY_TRACT | Status: DC
  Administered 2019-12-20 – 2019-12-21 (×2): 2 via RESPIRATORY_TRACT
  Filled 2019-12-20: qty 8.8

## 2019-12-20 MED ORDER — PROPOFOL 10 MG/ML IV BOLUS
INTRAVENOUS | Status: DC | PRN
  Administered 2019-12-20: 40 mg via INTRAVENOUS

## 2019-12-20 MED ORDER — IOHEXOL 350 MG/ML SOLN
100.0000 mL | Freq: Once | INTRAVENOUS | Status: AC | PRN
  Administered 2019-12-20: 100 mL via INTRAVENOUS

## 2019-12-20 NOTE — Anesthesia Postprocedure Evaluation (Signed)
Anesthesia Post Note  Patient: Kyle Wells  Procedure(s) Performed: ESOPHAGOGASTRODUODENOSCOPY (EGD) WITH PROPOFOL (N/A )  Patient location during evaluation: Endoscopy Anesthesia Type: General Level of consciousness: awake and alert and oriented Pain management: pain level controlled Vital Signs Assessment: post-procedure vital signs reviewed and stable Respiratory status: spontaneous breathing, nonlabored ventilation and respiratory function stable Cardiovascular status: blood pressure returned to baseline and stable Postop Assessment: no signs of nausea or vomiting Anesthetic complications: no   No complications documented.   Last Vitals:  Vitals:   12/20/19 1440 12/20/19 1450  BP: (!) 136/56 (!) 148/78  Pulse: 74   Resp: 20   Temp:    SpO2: 99%     Last Pain:  Vitals:   12/20/19 1433  TempSrc:   PainSc: 0-No pain                 Nuriyah Hanline

## 2019-12-20 NOTE — ED Provider Notes (Signed)
[facility] Emergency Department Provider Note  ____________________________________________   First MD Initiated Contact with Patient 12/20/19 [phone]     (approximate)  I have reviewed the triage vital signs and the nursing notes.   HISTORY  Chief Complaint GI Bleeding   HPI Kyle MunroJames W Wells is a 86 y.o. male with a past medical history of arthritis, asthma, BPH, COPD on 3 L nasal cannula baseline, HDL, OSA, and previous SBO, PE s/p postplacement of an IVC filter in the left lower extremity secondary to Pes, and total left nee replacement on 11/10 and currently anticoagulated on Xarelto who presents for assessment of nausea and vomiting consisting of coffee-ground emesis that began earlier this evening.  Patient denies any chest pain, shortness of breath, cough, abdominal pain, back pain, rash, or acute extremity pain.  He states he has been taking Xarelto.  He denies any known gastric ulcers, varices and has never had GI bleeding in the past.  He denies any current or previous regular EtOH use.  No prior similar signs.  No clearly getting aggravating factors.         Past Medical History:  Diagnosis Date  . Arthritis   . Asthma   . BPH (benign prostatic hyperplasia)   . COPD (chronic obstructive pulmonary disease) (HCC)   . Hypercholesterolemia   . Impingement syndrome of right shoulder   . OSA (obstructive sleep apnea)   . Small bowel obstruction [facility](HCC)     Patient Active Problem List   Diagnosis Date Noted  . Total knee replacement status 12/08/2019  . Multiple subsegmental pulmonary emboli without acute cor pulmonale (HCC) 06/14/2019  . Intractable nausea and vomiting 05/28/2019  . Primary osteoarthritis of both knees 11/03/2017  . SBO (small bowel obstruction) (HCC) 05/07/2017  . Small bowel obstruction (HCC) 12/10/2016  . Healthcare maintenance 12/02/2016  . Nausea   . Obesity, unspecified 09/26/2016  . Obstructive sleep apnea on CPAP  11/02/2015  . Chronic obstructive pulmonary disease (HCC)   . Intestinal obstruction (HCC)   . Ileus (HCC) 09/19/2014  . Impingement syndrome of shoulder, right 05/13/2014  . Benign localized hyperplasia of prostate with urinary obstruction and other lower urinary tract symptoms (LUTS)(600.21) 03/30/2014  . Hypercholesteremia 03/30/2014  . Sleep apnea 03/03/2012  . Benign localized prostatic hyperplasia with lower urinary tract symptoms (LUTS) 10/16/2011  . Elevated prostate specific antigen (PSA) 10/16/2011  . BPH (benign prostatic hyperplasia) 10/16/2011  . Incomplete emptying of bladder 10/16/2011  . Frequency of micturition 10/16/2011  . Retention of urine 10/16/2011  . Asthma with COPD (HCC) 05/31/2011  . Chronic obstructive asthma (HCC) 05/31/2011  . House dust mite allergy 10/20/2010  . Other allergy, other than to medicinal agents 10/20/2010    Past Surgical History:  Procedure Laterality Date  . COLONOSCOPY    . IVC FILTER INSERTION N/A 11/26/2019   Procedure: IVC FILTER INSERTION;  Surgeon: Renford DillsSchnier, Gregory G, MD;  Location: [facility] INVASIVE CV LAB;  Service: Cardiovascular;  Laterality: N/A;  . KNEE ARTHROPLASTY Left 12/08/2019   Procedure: COMPUTER ASSISTED TOTAL KNEE ARTHROPLASTY;  Surgeon: Donato HeinzHooten, Quindarrius P, MD;  Location: [facility] ORS;  Service: Orthopedics;  Laterality: Left;  . PROSTATE BIOPSY      Prior to Admission medications   Medication Sig Start Date End Date Taking? Authorizing Provider  acetaminophen (TYLENOL) 500 MG tablet Take 1,000 mg by mouth in the morning and at bedtime.     [provider]  ADVAIR DISKUS 500-50 MCG/DOSE AEPB Inhale 1 puff  into the lungs 2 (two) times daily.  09/12/14   [provider]  albuterol (PROVENTIL HFA;VENTOLIN HFA) 108 (90 Base) MCG/ACT inhaler Inhale 2 puffs into the lungs every 4 (four) hours as needed for wheezing or shortness of breath.    [provider]  albuterol (PROVENTIL) (2.5 MG/3ML) 0.083%  nebulizer solution Inhale 3 mLs into the lungs every 6 (six) hours as needed for wheezing or shortness of breath.  02/21/14   [provider]  azelastine (ASTELIN) 0.1 % nasal spray Place 2 sprays into both nostrils daily as needed for allergies.  05/21/19   [provider]  celecoxib (CELEBREX) 200 MG capsule Take 1 capsule (200 mg total) by mouth 2 (two) times daily. 12/10/19   Lasandra Beech B, PA-C  docusate sodium (COLACE) 100 MG capsule Take 100 mg by mouth at bedtime.    [provider]  finasteride (PROSCAR) 5 MG tablet Take 1 tablet (5 mg total) by mouth daily. Patient taking differently: Take 5 mg by mouth daily with supper.  09/21/14   Lattie Haw, MD  montelukast (SINGULAIR) 10 MG tablet Take 10 mg by mouth at bedtime.  09/12/14   [provider]  Multiple Vitamin (MULTIVITAMIN) capsule Take 1 capsule by mouth daily.    [provider]  oxyCODONE (OXY IR/ROXICODONE) 5 MG immediate release tablet Take 1 tablet (5 mg total) by mouth every 6 (six) hours as needed for moderate pain (pain score 4-6). 12/10/19   Lasandra Beech B, PA-C  polyethylene glycol (MIRALAX / GLYCOLAX) packet Take 17 g by mouth daily. Patient taking differently: Take 17 g by mouth at bedtime.  05/09/17   Katha Hamming, MD  rivaroxaban (XARELTO) 20 MG TABS tablet Take 20 mg by mouth daily.  06/10/19   [provider]  SPIRIVA HANDIHALER 18 MCG inhalation capsule Place 18 mcg into inhaler and inhale daily as needed (Wheezing).  09/13/14   [provider]  traMADol (ULTRAM) 50 MG tablet Take 1 tablet (50 mg total) by mouth every 4 (four) hours as needed for moderate pain. 12/10/19   Madelyn Flavors, PA-C    Allergies Zileuton and Roflumilast  Family History  Problem Relation Age of Onset  . Asthma Father   . Heart attack Father     Social History Social History   Tobacco Use  . Smoking status: Current Some Day Smoker    Packs/day: 0.10     Types: Cigarettes  . Smokeless tobacco: Never Used  Vaping Use  . Vaping Use: Never used  Substance Use Topics  . Alcohol use: No  . Drug use: No    Review of Systems  Review of Systems  Constitutional: Negative for chills and fever.  HENT: Negative for sore throat.   Eyes: Negative for pain.  Respiratory: Negative for cough and stridor.   Cardiovascular: Negative for chest pain.  Gastrointestinal: Positive for nausea and vomiting.  Genitourinary: Negative for dysuria.  Musculoskeletal: Negative for myalgias.  Skin: Negative for rash.  Neurological: Negative for seizures, loss of consciousness and headaches.  Psychiatric/Behavioral: Negative for suicidal ideas.  All other systems reviewed and are negative.     ____________________________________________   PHYSICAL EXAM:  VITAL SIGNS: ED Triage Vitals  Enc Vitals Group     BP --      Pulse Rate 12/20/19 0552 66     Resp 12/20/19 0552 16     Temp --      Temp Source 12/20/19 0552 Oral  SpO2 12/20/19 0552 97 %     Weight 12/20/19 0553 200 lb (90.7 kg)     Height 12/20/19 0553 5\' 6"  (1.676 m)     Head Circumference --      Peak Flow --      Pain Score 12/20/19 0553 0     Pain Loc --      Pain Edu? --      Excl. in GC? --    Vitals:   12/20/19 0600 12/20/19 0630  BP: (!) 158/93 (!) 145/85  Pulse: 69 69  Resp: 19   Temp:    SpO2: 100% 98%   Physical Exam Vitals and nursing note reviewed.  Constitutional:      Appearance: He is well-developed.  HENT:     Head: Normocephalic and atraumatic.     Right Ear: External ear normal.     Left Ear: External ear normal.     Nose: Nose normal.  Eyes:     Conjunctiva/sclera: Conjunctivae normal.  Cardiovascular:     Rate and Rhythm: Normal rate and regular rhythm.     Heart sounds: No murmur heard.   Pulmonary:     Effort: Pulmonary effort is normal. No respiratory distress.     Breath sounds: Normal breath sounds.  Abdominal:     Palpations: Abdomen is  soft.     Tenderness: There is no abdominal tenderness.  Musculoskeletal:     Cervical back: Neck supple.  Skin:    General: Skin is warm and dry.     Capillary Refill: Capillary refill takes less than 2 seconds.  Neurological:     Mental Status: He is alert and oriented to person, place, and time.  Psychiatric:        Mood and Affect: Mood normal.      ____________________________________________   LABS (all labs ordered are listed, but only abnormal results are displayed)  Labs Reviewed  COMPREHENSIVE METABOLIC PANEL - Abnormal; Notable for the following components:      Result Value   CO2 21 (*)    Glucose, Bld 159 (*)    BUN 24 (*)    Calcium 7.4 (*)    Total Protein 5.8 (*)    Albumin 2.8 (*)    All other components within normal limits  CBC WITH DIFFERENTIAL/PLATELET - Abnormal; Notable for the following components:   WBC 11.1 (*)    RBC 3.43 (*)    Hemoglobin 9.9 (*)    HCT 30.8 (*)    RDW 16.1 (*)    Neutro Abs 9.3 (*)    All other components within normal limits  PROTIME-INR - Abnormal; Notable for the following components:   Prothrombin Time 19.3 (*)    INR 1.7 (*)    All other components within normal limits  RESP PANEL BY RT-PCR (FLU A&B, COVID) ARPGX2  TYPE AND SCREEN   ____________________________________________  EKG  Sinus rhythm with a ventricular rate of 68, normal axis, unremarkable intervals, somewhat poor tracing and be 4 in V5 with nonspecific change in lead III.  No other clear evidence of acute ischemia. ____________________________________________  RADIOLOGY   Official radiology report(s): No results found.  ____________________________________________   PROCEDURES  Procedure(s) performed (including Critical Care):  .1-3 Lead EKG Interpretation Performed by: 12/22/19, MD Authorized by: Gilles Chiquito, MD     Interpretation: normal     ECG rate assessment: normal     Rhythm: sinus rhythm     Ectopy: none  Conduction: normal       ____________________________________________   INITIAL IMPRESSION / ASSESSMENT AND PLAN / ED COURSE        Patient presents above to history exam for assessment of some coffee-ground emesis nausea began earlier this evening. On arrival patient is afebrile hemodynamically stable. His abdomen is benign although he is nauseous and noted to have some coffee-ground emesis in the ED. His Hemoccult rectal exam is negative he denies any bleeding per rectum. He is anticoagulated on Xarelto.  Patient denies any history of upper GI bleeding differential includes but is not limited to possible undiagnosed varices versus bleeding peptic ulcer disease versus gastritis versus AVM or vascular malformation.  CMP remarkable for BUN of 24 with no other significant ultralight or metabolic derangements. CBC with WBC count of 11.1, hemoglobin of 9.9 compared to 11.610 days ago and platelets of 248.  I will plan to obtain a CTA abdomen pelvis to assess for evidence of active upper GI bleed versus other acute intra-abdominal pathologies believe a low suspicion for SBO, acute cholecystitis, pancreatitis, or perforated viscus at this time.  Patient signed over to oncoming provider at approximately 0 700. Plan is to follow-up CT and admit to medicine service for further evaluation management with consult to GI for possible endoscopy.        ____________________________________________   FINAL CLINICAL IMPRESSION(S) / ED DIAGNOSES  Final diagnoses:  Upper GI bleed  Anticoagulated  Anemia, unspecified type    Medications  ondansetron (ZOFRAN) injection 4 mg (4 mg Intravenous Given 12/20/19 0605)  pantoprazole (PROTONIX) injection 40 mg (40 mg Intravenous Given 12/20/19 0605)  iohexol (OMNIPAQUE) 350 MG/ML injection 100 mL (100 mLs Intravenous Contrast Given 12/20/19 0701)     ED Discharge Orders    None       Note:  This document was prepared using Dragon voice  recognition software and may include unintentional dictation errors.   Gilles Chiquito, MD 12/20/19 [phone]

## 2019-12-20 NOTE — ED Provider Notes (Signed)
Discussed patient with Dr. Daleen Squibb who will see the patient once he is admitted.  CT angio does not show any sign of bleeding.  Have consulted hospitalist for admission.   Arnaldo Natal, MD 12/20/19 [phone]

## 2019-12-20 NOTE — ED Notes (Signed)
Dark brown to black emesis noted.

## 2019-12-20 NOTE — Anesthesia Procedure Notes (Signed)
Date/Time: 12/20/2019 2:29 PM Performed by: Junious Silk, CRNA Pre-anesthesia Checklist: Patient identified, Emergency Drugs available, Suction available, Patient being monitored and Timeout performed Oxygen Delivery Method: Nasal cannula

## 2019-12-20 NOTE — Plan of Care (Signed)

## 2019-12-20 NOTE — Consult Note (Signed)
Midge Minium, MD [facility]  [address], [address] [place], [place] [address] Phone: [phone] Fax : [phone]  Consultation  Referring Provider:     Dr. Clyde Lundborg Primary Care Physician:  Lauro Regulus, MD Primary Gastroenterologist:  Dr. Norma Fredrickson         Reason for Consultation:     Hematemesis  Date of Admission:  12/20/2019 Date of Consultation:  12/20/2019         HPI:   Kyle Wells is a 86 y.o. male who comes in after having a knee replacement earlier this month with taking Celebrex.  The patient started having hematemesis at 5:00 this morning and had a couple episodes of coffee-ground emesis.  The patient's daughter states that he did not have any bright red blood or any sign of fresh bleeding.  She states that it was dark and she did not even know it was blood until she was told by the ER.  The ER doctor also mentioned that the patient was heme-negative.  The patient's blood count 3 weeks ago was 13.5 and 10 days ago was 11.6.  The patient was admitted with a hemoglobin of 9.9 this morning and without a transfusion 5 hours later at 11 AM the hemoglobin was 10.4.  The patient has had less vomiting of coffee grounds as per the daughter.  He denies any abdominal pain. The patient has CT angios that did not show any sign of bleeding. The patient has a history of asthma arthritis BPH and COPD on 3 L of oxygen at home.  He also has notes in the chart stating he has a history of small bowel obstruction.  The patient had been seen by Dr. Norma Fredrickson in the past and then a few days later had a another bout of small bowel obstruction and went to [facility] and was seen by Dr. Luan Pulling there.  The patient has been on Xarelto for anticoagulation.  Past Medical History:  Diagnosis Date  . Arthritis   . Asthma   . BPH (benign prostatic hyperplasia)   . COPD (chronic obstructive pulmonary disease) (HCC)   . Hypercholesterolemia   . Impingement syndrome of right shoulder   . OSA (obstructive sleep  apnea)   . Small bowel obstruction [facility])     Past Surgical History:  Procedure Laterality Date  . COLONOSCOPY    . IVC FILTER INSERTION N/A 11/26/2019   Procedure: IVC FILTER INSERTION;  Surgeon: Renford Dills, MD;  Location: [facility] INVASIVE CV LAB;  Service: Cardiovascular;  Laterality: N/A;  . KNEE ARTHROPLASTY Left 12/08/2019   Procedure: COMPUTER ASSISTED TOTAL KNEE ARTHROPLASTY;  Surgeon: Donato Heinz, MD;  Location: [facility] ORS;  Service: Orthopedics;  Laterality: Left;  . PROSTATE BIOPSY      Prior to Admission medications   Medication Sig Start Date End Date Taking? Authorizing Provider  acetaminophen (TYLENOL) 500 MG tablet Take 1,000 mg by mouth in the morning and at bedtime.     [provider]  ADVAIR DISKUS 500-50 MCG/DOSE AEPB Inhale 1 puff into the lungs 2 (two) times daily.  09/12/14   [provider]  albuterol (PROVENTIL HFA;VENTOLIN HFA) 108 (90 Base) MCG/ACT inhaler Inhale 2 puffs into the lungs every 4 (four) hours as needed for wheezing or shortness of breath.    [provider]  albuterol (PROVENTIL) (2.5 MG/3ML) 0.083% nebulizer solution Inhale 3 mLs into the lungs every 6 (six) hours as needed for wheezing or shortness of breath.  02/21/14  [provider]  azelastine (ASTELIN) 0.1 % nasal spray Place 2 sprays into both nostrils daily as needed for allergies.  05/21/19   [provider]  celecoxib (CELEBREX) 200 MG capsule Take 1 capsule (200 mg total) by mouth 2 (two) times daily. 12/10/19   Lasandra Beech B, PA-C  docusate sodium (COLACE) 100 MG capsule Take 100 mg by mouth at bedtime.    [provider]  finasteride (PROSCAR) 5 MG tablet Take 1 tablet (5 mg total) by mouth daily. Patient taking differently: Take 5 mg by mouth daily with supper.  09/21/14   Lattie Haw, MD  montelukast (SINGULAIR) 10 MG tablet Take 10 mg by mouth at bedtime.  09/12/14   [provider]  Multiple Vitamin  (MULTIVITAMIN) capsule Take 1 capsule by mouth daily.    [provider]  oxyCODONE (OXY IR/ROXICODONE) 5 MG immediate release tablet Take 1 tablet (5 mg total) by mouth every 6 (six) hours as needed for moderate pain (pain score 4-6). 12/10/19   Lasandra Beech B, PA-C  polyethylene glycol (MIRALAX / GLYCOLAX) packet Take 17 g by mouth daily. Patient taking differently: Take 17 g by mouth at bedtime.  05/09/17   Katha Hamming, MD  rivaroxaban (XARELTO) 20 MG TABS tablet Take 20 mg by mouth daily.  06/10/19   [provider]  SPIRIVA HANDIHALER 18 MCG inhalation capsule Place 18 mcg into inhaler and inhale daily as needed (Wheezing).  09/13/14   [provider]  traMADol (ULTRAM) 50 MG tablet Take 1 tablet (50 mg total) by mouth every 4 (four) hours as needed for moderate pain. 12/10/19   Madelyn Flavors, PA-C    Family History  Problem Relation Age of Onset  . Asthma Father   . Heart attack Father      Social History   Tobacco Use  . Smoking status: Current Some Day Smoker    Packs/day: 0.10    Types: Cigarettes  . Smokeless tobacco: Never Used  Vaping Use  . Vaping Use: Never used  Substance Use Topics  . Alcohol use: No  . Drug use: No    Allergies as of 12/20/2019 - Review Complete 12/20/2019  Allergen Reaction Noted  . Zileuton Hives 08/03/2013  . Roflumilast Other (See Comments) 08/03/2013    Review of Systems:    All systems reviewed and negative except where noted in HPI.   Physical Exam:  Vital signs in last 24 hours: Temp:  [98.1 F (36.7 C)-99.2 F (37.3 C)] 99.2 F (37.3 C) (11/22 1405) Pulse Rate:  [64-85] 76 (11/22 1405) Resp:  [16-22] 16 (11/22 1405) BP: (132-164)/(61-93) 132/64 (11/22 1405) SpO2:  [97 %-100 %] 100 % (11/22 1405) Weight:  [90.7 kg] 90.7 kg (11/22 1405)   General:   Pleasant, cooperative in NAD Head:  Normocephalic and atraumatic. Eyes:   No icterus.   Conjunctiva pink. PERRLA. Ears:  Normal auditory  acuity. Neck:  Supple; no masses or thyroidomegaly Lungs: Respirations even and unlabored. Lungs clear to auscultation bilaterally.   No wheezes, crackles, or rhonchi.  Heart:  Regular rate and rhythm;  Without murmur, clicks, rubs or gallops Abdomen:  Soft, nondistended, nontender. Normal bowel sounds. No appreciable masses or hepatomegaly.  No rebound or guarding.  Rectal:  Not performed. Msk:  Symmetrical without gross deformities.    Extremities:  Without edema, cyanosis or clubbing. Neurologic:  Alert and oriented x3;  grossly normal neurologically. Skin:  Intact without significant lesions or rashes. Cervical Nodes:  No significant cervical adenopathy. Psych:  Alert and cooperative. Normal affect.  LAB RESULTS: Recent Labs    12/20/19 0602 12/20/19 1113  WBC 11.1* 11.7*  HGB 9.9* 10.4*  HCT 30.8* 32.1*  PLT 248 275   BMET Recent Labs    12/20/19 0602  NA 137  K 3.5  CL 108  CO2 21*  GLUCOSE 159*  BUN 24*  CREATININE 0.72  CALCIUM 7.4*   LFT Recent Labs    12/20/19 0602  PROT 5.8*  ALBUMIN 2.8*  AST 16  ALT 12  ALKPHOS 41  BILITOT 0.6   PT/INR Recent Labs    12/20/19 0602  LABPROT 19.3*  INR 1.7*    STUDIES: CT Angio Abd/Pel W and/or Wo Contrast  Result Date: 12/20/2019 CLINICAL DATA:  86 year old male presenting with coffee-ground emesis. EXAM: CTA ABDOMEN AND PELVIS WITHOUT AND WITH CONTRAST TECHNIQUE: Multidetector CT imaging of the abdomen and pelvis was performed using the standard protocol before, during, and after bolus administration of intravenous contrast. Multiplanar reconstructed images and MIPs were obtained and reviewed to evaluate the vascular anatomy. CONTRAST:  OMNIPAQUE IOHEXOL 350 MG/ML SOLN COMPARISON:  05/28/2019 FINDINGS: VASCULAR Aorta: Normal caliber aorta without aneurysm, dissection, vasculitis or significant stenosis. Scattered atherosclerotic calcifications in the distal aorta. Celiac: Patent without evidence of  aneurysm, dissection, vasculitis or significant stenosis. SMA: Patent without evidence of aneurysm, dissection, vasculitis or significant stenosis. Of note, there is a replaced right hepatic artery arising from the proximal SMA. Renals: Single bilateral renal arteries are patent without evidence of aneurysm, dissection, vasculitis, fibromuscular dysplasia or significant stenosis. IMA: Patent without evidence of aneurysm, dissection, vasculitis or significant stenosis. Inflow: The left distal common iliac artery is demonstrates fusiform ectasia measuring up to approximately 15 mm. Mild scattered atherosclerotic calcifications bilaterally. Otherwise widely patent throughout. Proximal Outflow: Bilateral common femoral and visualized portions of the superficial and profunda femoral arteries are patent without evidence of aneurysm, dissection, vasculitis or significant stenosis. Veins: Infrarenal IVC filter in place with mild extravascular extrusion of 2 of the right sided supporting struts. No evidence of iliocaval thrombosis. Review of the MIP images confirms the above findings. NON-VASCULAR Lower chest: Bibasilar subsegmental atelectasis. The heart is normal in size. No pericardial effusion. Hepatobiliary: No focal liver abnormality is seen. No gallstones, gallbladder wall thickening, or biliary dilatation. Pancreas: Unremarkable. No pancreatic ductal dilatation or surrounding inflammatory changes. Spleen: Normal in size without focal abnormality. Adrenals/Urinary Tract: Adrenal glands are unremarkable. Similar appearing 1.7 cm simple cyst in the right upper pole. Kidneys are otherwise normal, without renal calculi, focal lesion, or hydronephrosis. Bladder is distended with a thin wall. Stomach/Bowel: No evidence of gastrointestinal hemorrhage. Mildly patulous distal esophagus with thickened, hyperemic wall. Stomach is within normal limits. Appendix appears normal. Similar appearing extensive colonic diverticula. No  evidence of bowel wall thickening, distention, or inflammatory changes. Lymphatic: No abdominopelvic lymphadenopathy. Reproductive: The prostate measures to 6.1 cm in maximum axial dimension, unchanged. Other: No abdominal wall hernia or abnormality. No abdominopelvic ascites. Musculoskeletal: Multilevel degenerative changes of the lumbar spine, most prominent at L2-L3. Bone islands are again visualized in the left acetabulum. No acute osseous abnormality. No aggressive appearing osseous lesion. IMPRESSION: VASCULAR No evidence of gastrointestinal hemorrhage. NON-VASCULAR 1. Mildly patulous and hyperemic distal esophagus. Consider endoscopic evaluation. 2. Prostatomegaly and chronic dilation of urinary bladder, likely indicative of bladder outlet obstruction. 3. Diverticulosis. Marliss Coots, MD [facility] Radiology Electronically Signed   By: Marliss Coots MD  On: 12/20/2019 08:32      Impression / Plan:   Assessment: Principal Problem:   Hematemesis Active Problems:   Asthma with COPD (HCC)   BPH (benign prostatic hyperplasia)   Hypercholesteremia   Blood loss anemia   Tobacco abuse   Kyle Wells is a 86 y.o. y/o male with who was admitted with hematemesis.  The patient's hematemesis presented as coffee-ground emesis.  There is no report of any previous history of GI bleeding.  The patient had been on Celebrex and Xarelto.  The patient is status post a knee replacement recently.  He has had less vomiting since coming to the floor but the daughter said that he had a lot of coffee ground emesis at home and in the ER this morning.  The patient's hemoglobin is stable and has even gone up since this morning.  Plan:  The patient will be set up for a upper endoscopy for today.  The patient and his daughter have been explained the plan and have been told that peptic ulcer disease is likely the cause of his bleeding.  The patient and his daughter  have been explained the plan and agree with it.  PPI IV twice daily  Continue serial CBCs and transfuse PRN Avoid NSAIDs Maintain 2 large-bore IV lines Please page GI with any acute hemodynamic changes, or signs of active GI bleeding   Thank you for involving me in the care of this patient.      LOS: 0 days   Midge Miniumarren Aniyiah Zell, MD, [facility] 12/20/2019, 2:09 PM,  Pager [phone] 7am-5pm  Check AMION for 5pm -7am coverage and on weekends   Note: This dictation was prepared with Dragon dictation along with smaller phrase technology. Any transcriptional errors that result from this process are unintentional.

## 2019-12-20 NOTE — Progress Notes (Signed)
Dressing changed to L knee honeycomb in place from previous surgery but it has a lot of bloody drainage, ortho to see pt. Ice applied

## 2019-12-20 NOTE — Op Note (Signed)
[facility] Gastroenterology Patient Name: Kyle Wells Procedure Date: 12/20/2019 2:04 PM MRN: [id number] Account #: [id number] Date of Birth: [birth year] Admit Type: Outpatient Age: 86 Room: [facility] ENDO ROOM 3 Gender: Male Note Status: Finalized Procedure:             Upper GI endoscopy Indications:           Coffee-ground emesis Providers:             Midge Minium MD, MD Medicines:             Propofol per Anesthesia Complications:         No immediate complications. Procedure:             Pre-Anesthesia Assessment:                        - Prior to the procedure, a History and Physical was                         performed, and patient medications and allergies were                         reviewed. The patient's tolerance of previous                         anesthesia was also reviewed. The risks and benefits                         of the procedure and the sedation options and risks                         were discussed with the patient. All questions were                         answered, and informed consent was obtained. Prior                         Anticoagulants: The patient has taken no previous                         anticoagulant or antiplatelet agents. ASA Grade                         Assessment: III - A patient with severe systemic                         disease. After reviewing the risks and benefits, the                         patient was deemed in satisfactory condition to                         undergo the procedure.                        After obtaining informed consent, the endoscope was                         passed under direct vision. Throughout the procedure,  the patient's blood pressure, pulse, and oxygen                         saturations were monitored continuously. The Endoscope                         was introduced through the mouth, and advanced to the                         second part of  duodenum. The upper GI endoscopy was                         accomplished without difficulty. The patient tolerated                         the procedure well. Findings:      A medium-sized hiatal hernia was present.      LA Grade B (one or more mucosal breaks greater than 5 mm, not extending       between the tops of two mucosal folds) esophagitis with bleeding was       found in the lower third of the esophagus.      A bleeding Mallory-Weiss tear with stigmata of recent bleeding was found.      Hematin (altered blood/coffee-ground-like material) was found in the       entire examined stomach.      The examined duodenum was normal. Impression:            - Medium-sized hiatal hernia.                        - LA Grade B reflux esophagitis with bleeding.                        - Mallory-Weiss tear.                        - Hematin (altered blood/coffee-ground-like material)                         in the entire stomach.                        - Normal examined duodenum.                        - No specimens collected. Recommendation:        - Return patient to hospital ward for ongoing care.                        - Clear liquid diet.                        - Continue present medications. Procedure Code(s):     --- Professional ---                        [phone], Esophagogastroduodenoscopy, flexible,                         transoral; diagnostic, including collection of  specimen(s) by brushing or washing, when performed                         (separate procedure) Diagnosis Code(s):     --- Professional ---                        K92.0, Hematemesis                        K21.01, Gastro-esophageal reflux disease with                         esophagitis, with bleeding                        K22.6, Gastro-esophageal laceration-hemorrhage syndrome CPT copyright 2019 American Medical Association. All rights reserved. The codes documented in this report are preliminary  and upon coder review may  be revised to meet current compliance requirements. Midge Minium MD, MD 12/20/2019 2:29:47 PM This report has been signed electronically. Number of Addenda: 0 Note Initiated On: 12/20/2019 2:04 PM Estimated Blood Loss:  Estimated blood loss: none.      [facility]

## 2019-12-20 NOTE — ED Triage Notes (Signed)
Pt with coffee ground emesis this am. Pt arrives with coffee ground emesis on shirt, ems states vomited same in truck. Pt with recent left knee replacement.

## 2019-12-20 NOTE — Transfer of Care (Signed)
Immediate Anesthesia Transfer of Care Note  Patient: Kyle Wells  Procedure(s) Performed: ESOPHAGOGASTRODUODENOSCOPY (EGD) WITH PROPOFOL (N/A )  Patient Location: PACU  Anesthesia Type:General  Level of Consciousness: sedated  Airway & Oxygen Therapy: Patient Spontanous Breathing and Patient connected to nasal cannula oxygen  Post-op Assessment: Report given to RN and Post -op Vital signs reviewed and stable  Post vital signs: Reviewed and stable  Last Vitals:  Vitals Value Taken Time  BP 118/53 12/20/19 1433  Temp    Pulse 69 12/20/19 1433  Resp 20 12/20/19 1433  SpO2 96 % 12/20/19 1433  Vitals shown include unvalidated device data.  Last Pain:  Vitals:   12/20/19 1433  TempSrc:   PainSc: 0-No pain         Complications: No complications documented.

## 2019-12-20 NOTE — ED Notes (Signed)
Patient still nauseated. Giving more zofran.

## 2019-12-20 NOTE — Anesthesia Preprocedure Evaluation (Signed)
Anesthesia Evaluation  Patient identified by MRN, date of birth, ID band Patient awake    Reviewed: Allergy & Precautions, H&P , NPO status , Patient's Chart, lab work & pertinent test results, reviewed documented beta blocker date and time   Airway Mallampati: II   Neck ROM: full    Dental  (+) Poor Dentition, Teeth Intact   Pulmonary neg pulmonary ROS, asthma , sleep apnea and Continuous Positive Airway Pressure Ventilation , COPD,  COPD inhaler, Current Smoker and Patient abstained from smoking.,    Pulmonary exam normal        Cardiovascular Exercise Tolerance: Poor negative cardio ROS Normal cardiovascular exam Rhythm:regular Rate:Normal     Neuro/Psych negative neurological ROS  negative psych ROS   GI/Hepatic negative GI ROS, Neg liver ROS,   Endo/Other  negative endocrine ROS  Renal/GU negative Renal ROS  negative genitourinary   Musculoskeletal   Abdominal   Peds  Hematology negative hematology ROS (+) Blood dyscrasia, anemia ,   Anesthesia Other Findings Past Medical History: No date: Arthritis No date: Asthma No date: BPH (benign prostatic hyperplasia) No date: COPD (chronic obstructive pulmonary disease) (HCC) No date: Hypercholesterolemia No date: Impingement syndrome of right shoulder No date: OSA (obstructive sleep apnea) No date: Small bowel obstruction (HCC) Past Surgical History: No date: COLONOSCOPY 11/26/2019: IVC FILTER INSERTION; N/A     Comment:  Procedure: IVC FILTER INSERTION;  Surgeon: Renford Dills, MD;  Location: [facility] INVASIVE CV LAB;  Service:              Cardiovascular;  Laterality: N/A; 12/08/2019: KNEE ARTHROPLASTY; Left     Comment:  Procedure: COMPUTER ASSISTED TOTAL KNEE ARTHROPLASTY;                Surgeon: Donato Heinz, MD;  Location: [facility] ORS;                Service: Orthopedics;  Laterality: Left; No date: PROSTATE BIOPSY BMI    Body Mass  Index: 32.28 kg/m     Reproductive/Obstetrics negative OB ROS                             Anesthesia Physical Anesthesia Plan  ASA: III and emergent  Anesthesia Plan: General   Post-op Pain Management:    Induction:   PONV Risk Score and Plan:   Airway Management Planned:   Additional Equipment:   Intra-op Plan:   Post-operative Plan:   Informed Consent: I have reviewed the patients History and Physical, chart, labs and discussed the procedure including the risks, benefits and alternatives for the proposed anesthesia with the patient or authorized representative who has indicated his/her understanding and acceptance.     Dental Advisory Given  Plan Discussed with: CRNA  Anesthesia Plan Comments:         Anesthesia Quick Evaluation

## 2019-12-20 NOTE — Progress Notes (Addendum)
  Subjective: Day of Surgery Procedure(s) (LRB): ESOPHAGOGASTRODUODENOSCOPY (EGD) WITH PROPOFOL (N/A) Patient readmitted through the ED for coffee ground emesis. Patient underwent endoscopy which revealed bleeding from the distal 1/3 of the esophagus. Celebrex and Xarelto d/c'd by GI. Patient is also s/p L TKA on 12/08/19 by Dr. Ernest Pine. Patient seen by ortho for excessive bleeding through the L knee dressing.   Objective: Vital signs in last 24 hours: Temp:  [98.1 F (36.7 C)-98.5 F (36.9 C)] 98.5 F (36.9 C) (11/22 1533) Pulse Rate:  [64-86] 86 (11/22 1533) Resp:  [16-22] 20 (11/22 1533) BP: (94-164)/(53-93) 110/71 (11/22 1533) SpO2:  [95 %-100 %] 100 % (11/22 1533) Weight:  [90.7 kg] 90.7 kg (11/22 1405)   Intake/Output from previous day:  Intake/Output Summary (Last 24 hours) at 12/20/2019 1841 Last data filed at 12/20/2019 1428 Gross per 24 hour  Intake 200 ml  Output --  Net 200 ml    Intake/Output this shift: Total I/O In: 200 [I.V.:200] Out: -   Labs: Recent Labs    12/20/19 0602 12/20/19 1113 12/20/19 1532  HGB 9.9* 10.4* 9.9*   Recent Labs    12/20/19 1113 12/20/19 1532  WBC 11.7* 12.2*  RBC 3.59* 3.38*  HCT 32.1* 30.3*  PLT 275 259   Recent Labs    12/20/19 0602  NA 137  K 3.5  CL 108  CO2 21*  BUN 24*  CREATININE 0.72  GLUCOSE 159*  CALCIUM 7.4*   Recent Labs    12/20/19 0602  INR 1.7*     EXAM General - Patient is Alert, Appropriate and Oriented Extremity - Neurovascular intact Dorsiflexion/Plantar flexion intact Compartment soft ; upon arrival in room, patient is seated with blankets under the knee and the knee held in slight flexion Dressing/Incision -honeycomb dressing saturated with blood; following removal of honeycomb dressing, slow, persistent bleeding noted from the proximal portion of the incision Motor Function - intact, moving foot and toes well on exam.    Assessment/Plan: Day of Surgery Procedure(s)  (LRB): ESOPHAGOGASTRODUODENOSCOPY (EGD) WITH PROPOFOL (N/A) Principal Problem:   Hematemesis Active Problems:   Asthma with COPD (HCC)   BPH (benign prostatic hyperplasia)   Hypercholesteremia   Multiple subsegmental pulmonary emboli without acute cor pulmonale (HCC)   Blood loss anemia   Tobacco abuse  Estimated body mass index is 32.27 kg/m as calculated from the following:   Height as of this encounter: 5\' 6"  (1.676 m).   Weight as of this encounter: 90.7 kg.  Will resume PT for the L knee while patient is in the hospital. Bone Foam placed under patient's L heel to encourage full extension.  Foot pumps applied to bilateral feet.  Compression dressing applied to L knee.  DVT Prophylaxis - TED hose, foot pumps Weight-Bearing as tolerated to left leg  , PA-C [facility] 12/20/2019, 6:41 PM

## 2019-12-20 NOTE — Progress Notes (Signed)
Foot pumps and ice packs applied as per md order

## 2019-12-20 NOTE — H&P (Signed)
History and Physical    Kyle Wells FBP:[id number] DOB: 1933/12/02 DOA: 12/20/2019  Referring MD/NP/PA:   PCP: Lauro Regulus, MD   Patient coming from:  The patient is coming from home.  At baseline, pt is independent for most of ADL.        Chief Complaint: Hematemesis  HPI: Kyle Wells is a 86 y.o. male with medical history significant of hyperlipidemia, COPD, asthma, OSA on CPAP, small bowel obstruction, BPH, arthritis, PE on Xarelto, tobacco abuse, who presents with hematemesis.  Per patient's daughter, patient has had 5 times of small amount of hematemesis with dark brown-colored blood since this morning.  Patient does not have diarrhea or abdominal pain.  Patient has chronic mild cough due to COPD which has not changed, no shortness of breath or chest pain today.  Per her daughter, patient had left knee surgery on 12/08/2019.  Patient is taking Xarelto for PE which was diagnosed 06/2019.  ED Course: pt was found to have hemoglobin 11.6 on 12/10/2019 --> 9.9, INR 1.7, negative Covid PCR, electrolytes renal function okay, temperature normal, blood pressure 143/61, heart rate 64, RR 22, oxygen saturation 97% on room air.  CT angiogram of abdomen/pelvis is negative for active bleeding.  Patient is placed on MedSurg bed for observation.  Dr. Servando Snare of GI is consulted.  Review of Systems:   General: no fevers, chills, no body weight gain, has fatigue HEENT: no blurry vision, hearing changes or sore throat Respiratory: no dyspnea, has coughing, no wheezing CV: no chest pain, no palpitations GI: no nausea, abdominal pain, diarrhea, constipation.  Has hematemesis GU: no dysuria, burning on urination, increased urinary frequency, hematuria  Ext: no leg edema Neuro: no unilateral weakness, numbness, or tingling, no vision change or hearing loss Skin: no rash, no skin tear. MSK: s/p left knee surgery. Heme: No easy bruising.  Travel history: No recent long distant  travel.  Allergy:  Allergies  Allergen Reactions  . Zileuton Hives    [Onset: 10/27/2009]    . Roflumilast Other (See Comments)    insomnia   [Onset: 11/29/2010]  Other reaction(s): Other (See Comments) Insomnia    Past Medical History:  Diagnosis Date  . Arthritis   . Asthma   . BPH (benign prostatic hyperplasia)   . COPD (chronic obstructive pulmonary disease) (HCC)   . Hypercholesterolemia   . Impingement syndrome of right shoulder   . OSA (obstructive sleep apnea)   . Small bowel obstruction [facility])     Past Surgical History:  Procedure Laterality Date  . COLONOSCOPY    . IVC FILTER INSERTION N/A 11/26/2019   Procedure: IVC FILTER INSERTION;  Surgeon: Renford Dills, MD;  Location: [facility] INVASIVE CV LAB;  Service: Cardiovascular;  Laterality: N/A;  . KNEE ARTHROPLASTY Left 12/08/2019   Procedure: COMPUTER ASSISTED TOTAL KNEE ARTHROPLASTY;  Surgeon: Donato Heinz, MD;  Location: [facility] ORS;  Service: Orthopedics;  Laterality: Left;  . PROSTATE BIOPSY      Social History:  reports that he has been smoking cigarettes. He has been smoking about 0.10 packs per day. He has never used smokeless tobacco. He reports that he does not drink alcohol and does not use drugs.  Family History:  Family History  Problem Relation Age of Onset  . Asthma Father   . Heart attack Father      Prior to Admission medications   Medication Sig Start Date End Date Taking? Authorizing Provider  acetaminophen (TYLENOL) 500 MG tablet Take 1,000  mg by mouth in the morning and at bedtime.     [provider]  ADVAIR DISKUS 500-50 MCG/DOSE AEPB Inhale 1 puff into the lungs 2 (two) times daily.  09/12/14   [provider]  albuterol (PROVENTIL HFA;VENTOLIN HFA) 108 (90 Base) MCG/ACT inhaler Inhale 2 puffs into the lungs every 4 (four) hours as needed for wheezing or shortness of breath.    [provider]  albuterol (PROVENTIL) (2.5 MG/3ML) 0.083% nebulizer solution  Inhale 3 mLs into the lungs every 6 (six) hours as needed for wheezing or shortness of breath.  02/21/14   [provider]  azelastine (ASTELIN) 0.1 % nasal spray Place 2 sprays into both nostrils daily as needed for allergies.  05/21/19   [provider]  celecoxib (CELEBREX) 200 MG capsule Take 1 capsule (200 mg total) by mouth 2 (two) times daily. 12/10/19   Lasandra Beech B, PA-C  docusate sodium (COLACE) 100 MG capsule Take 100 mg by mouth at bedtime.    [provider]  finasteride (PROSCAR) 5 MG tablet Take 1 tablet (5 mg total) by mouth daily. Patient taking differently: Take 5 mg by mouth daily with supper.  09/21/14   Lattie Haw, MD  montelukast (SINGULAIR) 10 MG tablet Take 10 mg by mouth at bedtime.  09/12/14   [provider]  Multiple Vitamin (MULTIVITAMIN) capsule Take 1 capsule by mouth daily.    [provider]  oxyCODONE (OXY IR/ROXICODONE) 5 MG immediate release tablet Take 1 tablet (5 mg total) by mouth every 6 (six) hours as needed for moderate pain (pain score 4-6). 12/10/19   Lasandra Beech B, PA-C  polyethylene glycol (MIRALAX / GLYCOLAX) packet Take 17 g by mouth daily. Patient taking differently: Take 17 g by mouth at bedtime.  05/09/17   Katha Hamming, MD  rivaroxaban (XARELTO) 20 MG TABS tablet Take 20 mg by mouth daily.  06/10/19   [provider]  SPIRIVA HANDIHALER 18 MCG inhalation capsule Place 18 mcg into inhaler and inhale daily as needed (Wheezing).  09/13/14   [provider]  traMADol (ULTRAM) 50 MG tablet Take 1 tablet (50 mg total) by mouth every 4 (four) hours as needed for moderate pain. 12/10/19   Madelyn Flavors, PA-C    Physical Exam: Vitals:   12/20/19 1433 12/20/19 1440 12/20/19 1450 12/20/19 1533  BP: (!) 118/53 (!) 136/56 (!) 148/78 110/71  Pulse: 73 74  86  Resp: (!) Temp:    98.5 F (36.9 C)  TempSrc:    Oral  SpO2: 95% 99%  100%  Weight:      Height:        General: Not in acute distress HEENT:       Eyes: PERRL, EOMI, no scleral icterus.       ENT: No discharge from the ears and nose, no pharynx injection, no tonsillar enlargement.        Neck: No JVD, no bruit, no mass felt. Heme: No neck lymph node enlargement. Cardiac: S1/S2, RRR, No murmurs, No gallops or rubs. Respiratory: No rales, wheezing, rhonchi or rubs. GI: Soft, nondistended, nontender, no rebound pain, no organomegaly, BS present. GU: No hematuria Ext: No pitting leg edema bilaterally. 1+DP/PT pulse bilaterally. Musculoskeletal: s/p of left knee surgery, has little blood in dressing. Skin: No rashes.  Neuro: Alert, oriented X3, cranial nerves II-XII grossly intact, moves all extremities normally. Psych: Patient is not psychotic, no suicidal or hemocidal ideation.  Labs on Admission: I have personally reviewed following labs and imaging studies  CBC: Recent Labs  Lab 12/20/19 0602 12/20/19 1113 12/20/19 1532  WBC 11.1* 11.7* 12.2*  NEUTROABS 9.3* 9.6*  --   HGB 9.9* 10.4* 9.9*  HCT 30.8* 32.1* 30.3*  MCV 89.8 89.4 89.6  PLT 248 275 259   Basic Metabolic Panel: Recent Labs  Lab 12/20/19 0602  NA 137  K 3.5  CL 108  CO2 21*  GLUCOSE 159*  BUN 24*  CREATININE 0.72  CALCIUM 7.4*   GFR: Estimated Creatinine Clearance: 72.5 mL/min (by C-G formula based on SCr of 0.72 mg/dL). Liver Function Tests: Recent Labs  Lab 12/20/19 0602  AST 16  ALT 12  ALKPHOS 41  BILITOT 0.6  PROT 5.8*  ALBUMIN 2.8*   No results for input(s): LIPASE, AMYLASE in the last 168 hours. No results for input(s): AMMONIA in the last 168 hours. Coagulation Profile: Recent Labs  Lab 12/20/19 0602  INR 1.7*   Cardiac Enzymes: No results for input(s): CKTOTAL, CKMB, CKMBINDEX, TROPONINI in the last 168 hours. BNP (last 3 results) No results for input(s): PROBNP in the last 8760 hours. HbA1C: No results for input(s): HGBA1C in the last 72 hours. CBG: No results for input(s):  GLUCAP in the last 168 hours. Lipid Profile: No results for input(s): CHOL, HDL, LDLCALC, TRIG, CHOLHDL, LDLDIRECT in the last 72 hours. Thyroid Function Tests: No results for input(s): TSH, T4TOTAL, FREET4, T3FREE, THYROIDAB in the last 72 hours. Anemia Panel: No results for input(s): VITAMINB12, FOLATE, FERRITIN, TIBC, IRON, RETICCTPCT in the last 72 hours. Urine analysis:    Component Value Date/Time   COLORURINE YELLOW 11/29/2019 1042   APPEARANCEUR CLEAR 11/29/2019 1042   LABSPEC >1.030 (H) 11/29/2019 1042   PHURINE 5.0 11/29/2019 1042   GLUCOSEU NEGATIVE 11/29/2019 1042   HGBUR NEGATIVE 11/29/2019 1042   BILIRUBINUR NEGATIVE 11/29/2019 1042   KETONESUR NEGATIVE 11/29/2019 1042   PROTEINUR NEGATIVE 11/29/2019 1042   NITRITE NEGATIVE 11/29/2019 1042   LEUKOCYTESUR NEGATIVE 11/29/2019 1042   Sepsis Labs: @LABRCNTIP (procalcitonin:4,lacticidven:4) ) Recent Results (from the past 240 hour(s))  Resp Panel by RT-PCR (Flu A&B, Covid) Nasopharyngeal Swab     Status: None   Collection Time: 12/20/19  6:02 AM   Specimen: Nasopharyngeal Swab; Nasopharyngeal(NP) swabs in vial transport medium  Result Value Ref Range Status   SARS Coronavirus 2 by RT PCR NEGATIVE NEGATIVE Final    Comment: (NOTE) SARS-CoV-2 target nucleic acids are NOT DETECTED.  The SARS-CoV-2 RNA is generally detectable in upper respiratory specimens during the acute phase of infection. The lowest concentration of SARS-CoV-2 viral copies this assay can detect is 138 copies/mL. A negative result does not preclude SARS-Cov-2 infection and should not be used as the sole basis for treatment or other patient management decisions. A negative result may occur with  improper specimen collection/handling, submission of specimen other than nasopharyngeal swab, presence of viral mutation(s) within the areas targeted by this assay, and inadequate number of viral copies(<138 copies/mL). A negative result must be combined  with clinical observations, patient history, and epidemiological information. The expected result is Negative.  Fact Sheet for Patients:  12/22/19  Fact Sheet for Healthcare Providers:  BloggerCourse.com  This test is no t yet approved or cleared by the [place] FDA and  has been authorized for detection and/or diagnosis of SARS-CoV-2 by FDA under an Emergency Use Authorization (EUA). This EUA will remain  in effect (meaning this test can be used) for the  duration of the COVID-19 declaration under Section 564(b)(1) of the Act, 21 U.S.C.section 360bbb-3(b)(1), unless the authorization is terminated  or revoked sooner.       Influenza A by PCR NEGATIVE NEGATIVE Final   Influenza B by PCR NEGATIVE NEGATIVE Final    Comment: (NOTE) The Xpert Xpress SARS-CoV-2/FLU/RSV plus assay is intended as an aid in the diagnosis of influenza from Nasopharyngeal swab specimens and should not be used as a sole basis for treatment. Nasal washings and aspirates are unacceptable for Xpert Xpress SARS-CoV-2/FLU/RSV testing.  Fact Sheet for Patients: BloggerCourse.com  Fact Sheet for Healthcare Providers: SeriousBroker.it  This test is not yet approved or cleared by the [place] FDA and has been authorized for detection and/or diagnosis of SARS-CoV-2 by FDA under an Emergency Use Authorization (EUA). This EUA will remain in effect (meaning this test can be used) for the duration of the COVID-19 declaration under Section 564(b)(1) of the Act, 21 U.S.C. section 360bbb-3(b)(1), unless the authorization is terminated or revoked.  Performed at [facility], [address] [place], [place] [address]      Radiological Exams on Admission: CT Angio Abd/Pel W and/or Wo Contrast  Result Date: 12/20/2019 CLINICAL DATA:  86 year old male presenting with coffee-ground emesis.  EXAM: CTA ABDOMEN AND PELVIS WITHOUT AND WITH CONTRAST TECHNIQUE: Multidetector CT imaging of the abdomen and pelvis was performed using the standard protocol before, during, and after bolus administration of intravenous contrast. Multiplanar reconstructed images and MIPs were obtained and reviewed to evaluate the vascular anatomy. CONTRAST:  OMNIPAQUE IOHEXOL 350 MG/ML SOLN COMPARISON:  05/28/2019 FINDINGS: VASCULAR Aorta: Normal caliber aorta without aneurysm, dissection, vasculitis or significant stenosis. Scattered atherosclerotic calcifications in the distal aorta. Celiac: Patent without evidence of aneurysm, dissection, vasculitis or significant stenosis. SMA: Patent without evidence of aneurysm, dissection, vasculitis or significant stenosis. Of note, there is a replaced right hepatic artery arising from the proximal SMA. Renals: Single bilateral renal arteries are patent without evidence of aneurysm, dissection, vasculitis, fibromuscular dysplasia or significant stenosis. IMA: Patent without evidence of aneurysm, dissection, vasculitis or significant stenosis. Inflow: The left distal common iliac artery is demonstrates fusiform ectasia measuring up to approximately 15 mm. Mild scattered atherosclerotic calcifications bilaterally. Otherwise widely patent throughout. Proximal Outflow: Bilateral common femoral and visualized portions of the superficial and profunda femoral arteries are patent without evidence of aneurysm, dissection, vasculitis or significant stenosis. Veins: Infrarenal IVC filter in place with mild extravascular extrusion of 2 of the right sided supporting struts. No evidence of iliocaval thrombosis. Review of the MIP images confirms the above findings. NON-VASCULAR Lower chest: Bibasilar subsegmental atelectasis. The heart is normal in size. No pericardial effusion. Hepatobiliary: No focal liver abnormality is seen. No gallstones, gallbladder wall thickening, or biliary dilatation.  Pancreas: Unremarkable. No pancreatic ductal dilatation or surrounding inflammatory changes. Spleen: Normal in size without focal abnormality. Adrenals/Urinary Tract: Adrenal glands are unremarkable. Similar appearing 1.7 cm simple cyst in the right upper pole. Kidneys are otherwise normal, without renal calculi, focal lesion, or hydronephrosis. Bladder is distended with a thin wall. Stomach/Bowel: No evidence of gastrointestinal hemorrhage. Mildly patulous distal esophagus with thickened, hyperemic wall. Stomach is within normal limits. Appendix appears normal. Similar appearing extensive colonic diverticula. No evidence of bowel wall thickening, distention, or inflammatory changes. Lymphatic: No abdominopelvic lymphadenopathy. Reproductive: The prostate measures to 6.1 cm in maximum axial dimension, unchanged. Other: No abdominal wall hernia or abnormality. No abdominopelvic ascites. Musculoskeletal: Multilevel degenerative changes of the lumbar spine, most prominent at  L2-L3. Bone islands are again visualized in the left acetabulum. No acute osseous abnormality. No aggressive appearing osseous lesion. IMPRESSION: VASCULAR No evidence of gastrointestinal hemorrhage. NON-VASCULAR 1. Mildly patulous and hyperemic distal esophagus. Consider endoscopic evaluation. 2. Prostatomegaly and chronic dilation of urinary bladder, likely indicative of bladder outlet obstruction. 3. Diverticulosis. Marliss Cootsylan Suttle, MD [facility] Electronically Signed   By: Marliss Cootsylan  Suttle MD   On: 12/20/2019 08:32     EKG: I have personally reviewed.  Sinus rhythm, QTC 427, low voltage, poor R wave progression, T wave inversion in lead III/aVF   Assessment/Plan Principal Problem:   Hematemesis Active Problems:   Asthma with COPD (HCC)   BPH (benign prostatic hyperplasia)   Hypercholesteremia   Multiple subsegmental pulmonary emboli without acute cor pulmonale (HCC)   Blood  loss anemia   Tobacco abuse   Hematemesis and blood loss anemia : hgb 11.6 -->9.9 -->10.4. Dr. Servando SnareWohl of GI is consulted. - will place in med-surg bed obs - GI consulted by Ed, will follow up recommendations - IVF:  75 cc of NS mL/hr - Start IV pantoprazole gtt - Zofran IV for nausea - Avoid NSAIDs and SQ heparin - Maintain IV access (2 large bore IVs if possible). - Monitor closely and follow q6h cbc, transfuse as necessary, if Hgb<7.0 - LaB: INR, PTT and type screen   Addendum: EGD is performed by Dr. Servando SnareWohl Impression: - Medium-sized hiatal hernia. - LA Grade B reflux esophagitis with bleeding. - Mallory-Weiss tear. - Hematin (altered blood/coffee-ground-like material) in the entire stomach. - Normal examined duodenum. - No specimens collected.  Asthma with COPD (HCC): Stable -Bronchodilators -Singulair  BPH (benign prostatic hyperplasia) -Proscar  Hypercholesteremia: Not taking medications -Follow-up wi  Multiple subsegmental pulmonary emboli without acute cor pulmonale (HCC): No chest pain or shortness breath. -Hold Xarelto today  Tobacco abuse -Nicotine patch  S/p of left knee surgery: -will consult wound care for dressing change      DVT ppx: SCD Code Status: Full code Family Communication:    Yes, patient's daughter at bed side Disposition Plan:  Anticipate discharge back to previous environment Consults called:  Dr. Servando SnareWohl of GI Admission status: Med-surg bed for obs   Status is: Observation  The patient remains OBS appropriate and will d/c before 2 midnights.  Dispo: The patient is from: Home              Anticipated d/c is to: Home              Anticipated d/c date is: 1 day              Patient currently is not medically stable to d/c.         Date of Service 12/20/2019    Lorretta HarpXilin Navy [facility]   If 7PM-7AM, please contact night-coverage [email or web address] 12/20/2019, 5:31 PM

## 2019-12-20 NOTE — ED Notes (Addendum)
1 episode of coffee ground emesis. Hemocult performed per Terrilee Files. Result negative.

## 2019-12-20 NOTE — ED Notes (Signed)
Patient transported to CT 

## 2019-12-20 NOTE — ED Notes (Signed)
Patient reports nausea. Per Dr. Darnelle Catalan okay to give 4 of Zofran,order placed.

## 2019-12-21 ENCOUNTER — Encounter: Payer: Self-pay | Admitting: Gastroenterology

## 2019-12-21 DIAGNOSIS — J449 Chronic obstructive pulmonary disease, unspecified: Secondary | ICD-10-CM | POA: Diagnosis not present

## 2019-12-21 DIAGNOSIS — D649 Anemia, unspecified: Secondary | ICD-10-CM | POA: Diagnosis not present

## 2019-12-21 DIAGNOSIS — Z7901 Long term (current) use of anticoagulants: Secondary | ICD-10-CM

## 2019-12-21 DIAGNOSIS — K920 Hematemesis: Secondary | ICD-10-CM | POA: Diagnosis not present

## 2019-12-21 LAB — BASIC METABOLIC PANEL
Anion gap: 7 (ref 5–15)
BUN: 22 mg/dL (ref 8–23)
CO2: 23 mmol/L (ref 22–32)
Calcium: 8.1 mg/dL — ABNORMAL LOW (ref 8.9–10.3)
Chloride: 106 mmol/L (ref 98–111)
Creatinine, Ser: 0.79 mg/dL (ref 0.61–1.24)
GFR, Estimated: >60 mL/min (ref >60)
Glucose, Bld: 94 mg/dL (ref 70–99)
Potassium: 3.8 mmol/L (ref 3.5–5.1)
Sodium: 136 mmol/L (ref 135–145)

## 2019-12-21 LAB — CBC
HCT: 27.5 % — ABNORMAL LOW (ref 39.0–52.0)
HCT: 28.3 % — ABNORMAL LOW (ref 39.0–52.0)
HCT: 29.8 % — ABNORMAL LOW (ref 39.0–52.0)
Hemoglobin: 8.8 g/dL — ABNORMAL LOW (ref 13.0–17.0)
Hemoglobin: 9 g/dL — ABNORMAL LOW (ref 13.0–17.0)
Hemoglobin: 9.7 g/dL — ABNORMAL LOW (ref 13.0–17.0)
MCH: 29.2 pg (ref 26.0–34.0)
MCH: 28.9 pg (ref 26.0–34.0)
MCH: 29.3 pg (ref 26.0–34.0)
MCHC: 32 g/dL (ref 30.0–36.0)
MCHC: 31.8 g/dL (ref 30.0–36.0)
MCHC: 32.6 g/dL (ref 30.0–36.0)
MCV: 91.4 fL (ref 80.0–100.0)
MCV: 91 fL (ref 80.0–100.0)
MCV: 90 fL (ref 80.0–100.0)
Platelets: 236 10*3/uL (ref 150–400)
Platelets: 244 10*3/uL (ref 150–400)
Platelets: 288 10*3/uL (ref 150–400)
RBC: 3.01 MIL/uL — ABNORMAL LOW (ref 4.22–5.81)
RBC: 3.11 MIL/uL — ABNORMAL LOW (ref 4.22–5.81)
RBC: 3.31 MIL/uL — ABNORMAL LOW (ref 4.22–5.81)
RDW: 16.5 % — ABNORMAL HIGH (ref 11.5–15.5)
RDW: 16.7 % — ABNORMAL HIGH (ref 11.5–15.5)
RDW: 16.5 % — ABNORMAL HIGH (ref 11.5–15.5)
WBC: 9.8 10*3/uL (ref 4.0–10.5)
WBC: 9.3 10*3/uL (ref 4.0–10.5)
WBC: 11.2 10*3/uL — ABNORMAL HIGH (ref 4.0–10.5)
nRBC: 0 % (ref 0.0–0.2)
nRBC: 0 % (ref 0.0–0.2)
nRBC: 0 % (ref 0.0–0.2)

## 2019-12-21 MED ORDER — ASPIRIN EC 81 MG PO TBEC: 81.0000 mg | DELAYED_RELEASE_TABLET | Freq: Every day | ORAL | 0 refills | Status: DC

## 2019-12-21 MED ORDER — ASPIRIN EC 81 MG PO TBEC: 81.0000 mg | DELAYED_RELEASE_TABLET | Freq: Every day | ORAL | 0 refills | Status: AC

## 2019-12-21 MED ORDER — RIVAROXABAN 20 MG PO TABS
20.0000 mg | ORAL_TABLET | Freq: Every day | ORAL | 0 refills | Status: DC
Start: 2019-12-27 — End: 2023-03-30

## 2019-12-21 MED ORDER — PANTOPRAZOLE SODIUM 40 MG PO TBEC: 40.0000 mg | DELAYED_RELEASE_TABLET | Freq: Two times a day (BID) | ORAL | 0 refills | Status: AC

## 2019-12-21 NOTE — Progress Notes (Addendum)
  Subjective: 1 Day Post-Op Procedure(s) (LRB): ESOPHAGOGASTRODUODENOSCOPY (EGD) WITH PROPOFOL (N/A) Patient reports pain as 3 on 0-10 scale.   Patient is well, and has had no acute complaints or problems. He does state the overnight nursing staff was very attentive. Gastrointestinal: negative for nausea and vomiting, negative for lightheadedness or dizziness.  Objective: Vital signs in last 24 hours: Temp:  [97.6 F (36.4 C)-98.6 F (37 C)] 98.1 F (36.7 C) (11/23 0810) Pulse Rate:  [64-86] 71 (11/23 0810) Resp:  [16-21] 20 (11/23 0810) BP: (94-164)/(53-81) 114/72 (11/23 0810) SpO2:  [95 %-100 %] 96 % (11/23 0810) Weight:  [90.7 kg] 90.7 kg (11/22 1405)  Intake/Output from previous day:  Intake/Output Summary (Last 24 hours) at 12/21/2019 0815 Last data filed at 12/21/2019 0700 Gross per 24 hour  Intake 1023.84 ml  Output 400 ml  Net 623.84 ml    Intake/Output this shift: No intake/output data recorded.  Labs: Recent Labs    12/20/19 0602 12/20/19 1113 12/20/19 1532 12/20/19 2118 12/21/19 0302  HGB 9.9* 10.4* 9.9* 9.9* 8.8*   Recent Labs    12/20/19 2118 12/21/19 0302  WBC 11.0* 9.8  RBC 3.42* 3.01*  HCT 31.2* 27.5*  PLT 226 236   Recent Labs    12/20/19 0602 12/21/19 0302  NA 137 136  K 3.5 3.8  CL 108 106  CO2 21* 23  BUN 24* 22  CREATININE 0.72 0.79  GLUCOSE 159* 94  CALCIUM 7.4* 8.1*   Recent Labs    12/20/19 0602  INR 1.7*     EXAM General - Patient is Alert, Appropriate and Oriented Extremity - Neurovascular intact Dorsiflexion/Plantar flexion intact Compartment soft; Bone Foam in place Dressing/Incision -clean, dry, no drainage Motor Function - intact, moving foot and toes well on exam.     Assessment/Plan: 1 Day Post-Op Procedure(s) (LRB): ESOPHAGOGASTRODUODENOSCOPY (EGD) WITH PROPOFOL (N/A) Principal Problem:   Hematemesis Active Problems:   Asthma with COPD (HCC)   BPH (benign prostatic hyperplasia)    Hypercholesteremia   Multiple subsegmental pulmonary emboli without acute cor pulmonale (HCC)   Blood loss anemia   Tobacco abuse  Estimated body mass index is 32.27 kg/m as calculated from the following:   Height as of this encounter: 5\' 6"  (1.676 m).   Weight as of this encounter: 90.7 kg.  S/p L TKA 12/08/19 Up with physical therapy today.  Plan to remove compression dressing and remove staples prior to d/c. Waiting at this time to allow as much time for compression dressing to stop bleeding as possible.   Message sent to attending asking about likely d/c date.  DVT Prophylaxis - foot pumps, TED hose  Weight-Bearing as tolerated to left leg  13/10/21, PA-C [facility] 12/21/2019, 8:15 AM

## 2019-12-21 NOTE — TOC Initial Note (Signed)
Transition of Care [facility]) - Initial/Assessment Note    Patient Details  Name: Kyle Wells MRN: [id number] Date of Birth: [birth year]  Transition of Care [facility]) CM/SW Contact:    Chapman Fitch, RN Phone Number: 12/21/2019, 1:09 PM  Clinical Narrative:                  Patient admitted from home with hematemesis Daughter at bedside Patient lives in his own home. Daughter is currently staying with him  Patient has Rw, cane, scooter, and adjustable bed in the home  Patient open with [facility] and wishes to discharge home with resumption of care orders Patient to discharge today Rosey Bath with [facility] notified.   MD has discussed disposition with daughter and patient  Expected Discharge Plan: Home w Home Health Services     Patient Goals and CMS Choice        Expected Discharge Plan and Services Expected Discharge Plan: Home w Home Health Services         Expected Discharge Date: 12/21/19                         HH Arranged: PT, OT HH Agency: [facility] (formerly [facility]) Date HH Agency Contacted: 12/21/19   Representative spoke with at [facility] Agency: Rosey Bath  Prior Living Arrangements/Services     Patient language and need for interpreter reviewed:: Yes Do you feel safe going back to the place where you live?: Yes      Need for Family Participation in Patient Care: Yes (Comment) Care giver support system in place?: Yes (comment)   Criminal Activity/Legal Involvement Pertinent to Current Situation/Hospitalization: No - Comment as needed  Activities of Daily Living Home Assistive Devices/Equipment: Dan Humphreys (specify type) ADL Screening (condition at time of admission) Patient's cognitive ability adequate to safely complete daily activities?: Yes Is the patient deaf or have difficulty hearing?: No Does the patient have difficulty seeing, even when wearing glasses/contacts?: No Does the patient have difficulty concentrating,  remembering, or making decisions?: No Patient able to express need for assistance with ADLs?: Yes Does the patient have difficulty dressing or bathing?: No Independently performs ADLs?: Yes (appropriate for developmental age) Does the patient have difficulty walking or climbing stairs?: Yes Weakness of Legs: Left Weakness of Arms/Hands: None  Permission Sought/Granted                  Emotional Assessment Appearance:: Appears stated age     Orientation: : Oriented to Self, Oriented to Place, Oriented to  Time, Oriented to Situation      Admission diagnosis:  Hematemesis [K92.0] Upper GI bleed [K92.2] Anticoagulated [Z79.01] Anemia, unspecified type [D64.9] Patient Active Problem List   Diagnosis Date Noted  . Anticoagulated   . Anemia 12/20/2019  . Hematemesis 12/20/2019  . Tobacco abuse 12/20/2019  . Gastro-esophageal reflux disease with esophagitis, with bleeding   . Total knee replacement status 12/08/2019  . Multiple subsegmental pulmonary emboli without acute cor pulmonale (HCC) 06/14/2019  . Intractable nausea and vomiting 05/28/2019  . Primary osteoarthritis of both knees 11/03/2017  . SBO (small bowel obstruction) (HCC) 05/07/2017  . Small bowel obstruction (HCC) 12/10/2016  . Healthcare maintenance 12/02/2016  . Nausea   . Obesity, unspecified 09/26/2016  . Obstructive sleep apnea on CPAP 11/02/2015  . Chronic obstructive pulmonary disease (HCC)   . Intestinal obstruction (HCC)   . Ileus (HCC) 09/19/2014  . Impingement syndrome of shoulder,  right 05/13/2014  . Benign localized hyperplasia of prostate with urinary obstruction and other lower urinary tract symptoms (LUTS)(600.21) 03/30/2014  . Hypercholesteremia 03/30/2014  . Sleep apnea 03/03/2012  . Benign localized prostatic hyperplasia with lower urinary tract symptoms (LUTS) 10/16/2011  . Elevated prostate specific antigen (PSA) 10/16/2011  . BPH (benign prostatic hyperplasia) 10/16/2011  .  Incomplete emptying of bladder 10/16/2011  . Frequency of micturition 10/16/2011  . Retention of urine 10/16/2011  . Asthma with COPD (HCC) 05/31/2011  . Chronic obstructive asthma (HCC) 05/31/2011  . House dust mite allergy 10/20/2010  . Other allergy, other than to medicinal agents 10/20/2010   PCP:  Lauro Regulus, MD Pharmacy:   [facility] [phone] - [place], [place] - [address] [place] [address] Phone: [phone] Fax: [phone]     Social Determinants of Health (SDOH) Interventions    Readmission Risk Interventions No flowsheet data found.

## 2019-12-21 NOTE — Evaluation (Signed)
Physical Therapy Evaluation Patient Details Name: Kyle Wells MRN: [id number] DOB: [birth year] Today's Date: 12/21/2019   History of Present Illness  Pt is an 86 y.o. male with medical history significant of hyperlipidemia, COPD, asthma, OSA on CPAP, small bowel obstruction, BPH, arthritis, PE on Xarelto, tobacco abuse, and L TKA 12/08/19 who presented with hematemesis.  MD assessment inlcudes: Hematemesis, blood loss anemia, medium-sized hiatal hernia, esophagitis, Mallory-Weiss tear, hematin in the stomach.    Clinical Impression  Pt was pleasant and motivated to participate during the session.  Pt required min A to come to full upright sitting position but was SBA only with transfers and amb.  Pt's HR and SpO2 were WNL during the session with no adverse symptoms reported other than mild HA.  Pt receiving HHPT for recent L TKA and per daughter was scheduled to transition to OPPT.  Pt will benefit from HHPT upon discharge  to safely address deficits listed in patient problem list for decreased caregiver assistance and eventual return to PLOF.      Follow Up Recommendations Home health PT;Other (comment) (Per daughter pt scheduled to transition from HHPT to OPPT)    Equipment Recommendations  None recommended by PT    Recommendations for Other Services       Precautions / Restrictions Precautions Precautions: Fall Restrictions LLE Weight Bearing: Weight bearing as tolerated      Mobility  Bed Mobility Overal bed mobility: Needs Assistance Bed Mobility: Supine to Sit     Supine to sit: Min assist     General bed mobility comments: Min A for trunk control to full upright position    Transfers Overall transfer level: Needs assistance Equipment used: Rolling walker (2 wheeled) Transfers: Sit to/from Stand Sit to Stand: Supervision         General transfer comment: Good control and stability with transfers  Ambulation/Gait Ambulation/Gait assistance:  Supervision Gait Distance (Feet): 60 Feet Assistive device: Rolling walker (2 wheeled) Gait Pattern/deviations: Step-through pattern;Decreased step length - right;Decreased step length - left Gait velocity: decreased   General Gait Details: Slow cadence but steady without LOB  Stairs            Wheelchair Mobility    Modified Rankin (Stroke Patients Only)       Balance Overall balance assessment: No apparent balance deficits (not formally assessed)                                           Pertinent Vitals/Pain Pain Assessment: 0-10 Pain Score: 2  Pain Location: HA Pain Descriptors / Indicators: Aching Pain Intervention(s): RN gave pain meds during session    Home Living Family/patient expects to be discharged to:: Private residence Living Arrangements: Alone Available Help at Discharge: Family;Available 24 hours/day Type of Home: House Home Access: Level entry     Home Layout: One level Home Equipment: Walker - 2 wheels;Cane - single point;Shower seat;Electric scooter      Prior Function Level of Independence: Needs assistance   Gait / Transfers Assistance Needed: SBA with amb limited community distances, no fall history  ADL's / Homemaking Assistance Needed: Ind with ADLs        Hand Dominance        Extremity/Trunk Assessment   Upper Extremity Assessment Upper Extremity Assessment: Overall WFL for tasks assessed    Lower Extremity Assessment Lower Extremity Assessment: Generalized weakness  Communication   Communication: No difficulties  Cognition Arousal/Alertness: Awake/alert Behavior During Therapy: WFL for tasks assessed/performed Overall Cognitive Status: Within Functional Limits for tasks assessed                                        General Comments      Exercises Total Joint Exercises Quad Sets: AROM;Left;10 reps;Strengthening Goniometric ROM: L knee ext lacking 2 deg, flex not  tested per PA [place] request secondary to staples removed just prior to session with some minimal bleeding Other Exercises Other Exercises: Positioning education with pt and daughter   Assessment/Plan    PT Assessment Patient needs continued PT services  PT Problem List Decreased strength;Decreased range of motion;Decreased activity tolerance;Decreased mobility       PT Treatment Interventions DME instruction;Gait training;Functional mobility training;Therapeutic activities;Therapeutic exercise;Balance training;Patient/family education;Stair training    PT Goals (Current goals can be found in the Care Plan section)  Acute Rehab PT Goals Patient Stated Goal: To return home PT Goal Formulation: With patient Time For Goal Achievement: 01/03/20 Potential to Achieve Goals: Good    Frequency Min 2X/week   Barriers to discharge        Co-evaluation               AM-PAC PT "6 Clicks" Mobility  Outcome Measure Help needed turning from your back to your side while in a flat bed without using bedrails?: A Little Help needed moving from lying on your back to sitting on the side of a flat bed without using bedrails?: A Little Help needed moving to and from a bed to a chair (including a wheelchair)?: A Little Help needed standing up from a chair using your arms (e.g., wheelchair or bedside chair)?: A Little Help needed to walk in hospital room?: A Little Help needed climbing 3-5 steps with a railing? : A Little 6 Click Score: 18    End of Session Equipment Utilized During Treatment: Gait belt Activity Tolerance: Patient tolerated treatment well Patient left: in bed;with family/visitor present;with call bell/phone within reach;with nursing/sitter in room;Other (comment) (Pt left sitting at EOB with MD in room and CNA arriving for bathing) Nurse Communication: Mobility status PT Visit Diagnosis: Difficulty in walking, not elsewhere classified (R26.2);Other abnormalities of gait  and mobility (R26.89);Muscle weakness (generalized) (M62.81)    Time: 1355-1420 PT Time Calculation (min) (ACUTE ONLY): 25 min   Charges:   PT Evaluation $PT Eval Moderate Complexity: 1 Mod          D. Scott Klaira Pesci PT, DPT 12/21/19, 3:41 PM

## 2019-12-21 NOTE — Plan of Care (Signed)
  Problem: Education: Goal: Knowledge of General Education information will improve Description: Including pain rating scale, medication(s)/side effects and non-pharmacologic comfort measures 12/21/2019 1344 by Orvan Seen, RN Outcome: Completed/Met 12/21/2019 0943 by Orvan Seen, RN Outcome: Progressing   Problem: Health Behavior/Discharge Planning: Goal: Ability to manage health-related needs will improve 12/21/2019 1344 by Orvan Seen, RN Outcome: Completed/Met 12/21/2019 0943 by Orvan Seen, RN Outcome: Progressing   Problem: Clinical Measurements: Goal: Ability to maintain clinical measurements within normal limits will improve 12/21/2019 1344 by Orvan Seen, RN Outcome: Completed/Met 12/21/2019 0943 by Orvan Seen, RN Outcome: Progressing Goal: Will remain free from infection 12/21/2019 1344 by Orvan Seen, RN Outcome: Completed/Met 12/21/2019 0943 by Orvan Seen, RN Outcome: Progressing Goal: Diagnostic test results will improve 12/21/2019 1344 by Orvan Seen, RN Outcome: Completed/Met 12/21/2019 0943 by Orvan Seen, RN Outcome: Progressing Goal: Respiratory complications will improve 12/21/2019 1344 by Orvan Seen, RN Outcome: Completed/Met 12/21/2019 0943 by Orvan Seen, RN Outcome: Progressing Goal: Cardiovascular complication will be avoided 12/21/2019 1344 by Orvan Seen, RN Outcome: Completed/Met 12/21/2019 0943 by Orvan Seen, RN Outcome: Progressing   Problem: Activity: Goal: Risk for activity intolerance will decrease 12/21/2019 1344 by Orvan Seen, RN Outcome: Completed/Met 12/21/2019 0943 by Orvan Seen, RN Outcome: Progressing   Problem: Nutrition: Goal: Adequate nutrition will be maintained 12/21/2019 1344 by Orvan Seen, RN Outcome: Completed/Met 12/21/2019 0943 by Orvan Seen, RN Outcome: Progressing   Problem: Coping: Goal: Level of anxiety will  decrease 12/21/2019 1344 by Orvan Seen, RN Outcome: Completed/Met 12/21/2019 0943 by Orvan Seen, RN Outcome: Progressing   Problem: Elimination: Goal: Will not experience complications related to bowel motility 12/21/2019 1344 by Orvan Seen, RN Outcome: Completed/Met 12/21/2019 0943 by Orvan Seen, RN Outcome: Progressing Goal: Will not experience complications related to urinary retention 12/21/2019 1344 by Orvan Seen, RN Outcome: Completed/Met 12/21/2019 0943 by Orvan Seen, RN Outcome: Progressing   Problem: Pain Managment: Goal: General experience of comfort will improve 12/21/2019 1344 by Orvan Seen, RN Outcome: Completed/Met 12/21/2019 0943 by Orvan Seen, RN Outcome: Progressing   Problem: Safety: Goal: Ability to remain free from injury will improve 12/21/2019 1344 by Orvan Seen, RN Outcome: Completed/Met 12/21/2019 0943 by Orvan Seen, RN Outcome: Progressing   Problem: Skin Integrity: Goal: Risk for impaired skin integrity will decrease 12/21/2019 1344 by Orvan Seen, RN Outcome: Completed/Met 12/21/2019 0943 by Orvan Seen, RN Outcome: Progressing

## 2019-12-21 NOTE — Discharge Instructions (Signed)

## 2019-12-21 NOTE — Discharge Summary (Addendum)
5        [place] [facility]   PATIENT NAME: Kyle Wells    MR#:  [id number]  DATE OF BIRTH:  Mar 06, 1933  DATE OF ADMISSION:  12/20/2019   ADMITTING PHYSICIAN: Lorretta Harp, MD  DATE OF DISCHARGE: 12/21/2019  PRIMARY CARE PHYSICIAN: Lauro Regulus, MD   ADMISSION DIAGNOSIS:  Hematemesis [K92.0] Upper GI bleed [K92.2] Anticoagulated [Z79.01] Anemia, unspecified type [D64.9] DISCHARGE DIAGNOSIS:  Principal Problem:   Hematemesis Active Problems:   Asthma with COPD (HCC)   BPH (benign prostatic hyperplasia)   Hypercholesteremia   Multiple subsegmental pulmonary emboli without acute cor pulmonale (HCC)   Anemia   Tobacco abuse   Anticoagulated  SECONDARY DIAGNOSIS:   Past Medical History:  Diagnosis Date  . Arthritis   . Asthma   . BPH (benign prostatic hyperplasia)   . COPD (chronic obstructive pulmonary disease) (HCC)   . Hypercholesterolemia   . Impingement syndrome of right shoulder   . OSA (obstructive sleep apnea)   . Small bowel obstruction [facility])    HOSPITAL COURSE:  Kyle Wells is a 86 y.o. male with medical history significant of hyperlipidemia, COPD, asthma, OSA on CPAP, small bowel obstruction, BPH, arthritis, PE on Xarelto, tobacco abuse, who presents with hematemesis.  Hematemesis and blood loss anemia : Status post EGD showing medium size hiatal hernia, grade B reflux esophagitis with bleeding, Mallory-Weiss tear.  Hematin in the entire stomach.  Normal duodenum. -Patient did not have any further bleeding, tolerated diet without any symptoms and would like to go home. -If patient does not have any further bleed, patient is to start 81 mg ASA on Friday 12/24/2019, and will restart Xarelto on Monday 12/27/2019. -Protonix 40 mg p.o. twice daily at discharge  Asthma with COPD (HCC): Stable -Bronchodilators -Singulair  BPH (benign prostatic hyperplasia) -Proscar  Hypercholesteremia: Not taking medications  Multiple  subsegmental pulmonary emboli without acute cor pulmonale (HCC): No chest pain or shortness breath. -Hold Xarelto  till this Sunday on 11/28 in the hope that he does not have further bleeding  Tobacco abuse -Counseled  S/p of left TKA on 12/08/2019 -Ortho has removed staples Steri-Strips placed over incision.  Patient was instructed to follow-up with orthopedic office in 1 week.  Patient to follow instructions per orthopedics   DISCHARGE CONDITIONS:  Stable CONSULTS OBTAINED:  Treatment Team:  Midge Minium, MD DRUG ALLERGIES:   Allergies  Allergen Reactions  . Zileuton Hives    [Onset: 10/27/2009]    . Roflumilast Other (See Comments)    insomnia   [Onset: 11/29/2010]  Other reaction(s): Other (See Comments) Insomnia   DISCHARGE MEDICATIONS:   Allergies as of 12/21/2019      Reactions   Zileuton Hives   [Onset: 10/27/2009]   Roflumilast Other (See Comments)   insomnia   [Onset: 11/29/2010] Other reaction(s): Other (See Comments) Insomnia      Medication List    STOP taking these medications   celecoxib 200 MG capsule Commonly known as: CELEBREX     TAKE these medications   acetaminophen 500 MG tablet Commonly known as: TYLENOL Take 1,000 mg by mouth in the morning and at bedtime.   Advair Diskus 500-50 MCG/DOSE Aepb Generic drug: Fluticasone-Salmeterol Inhale 1 puff into the lungs 2 (two) times daily.   albuterol 108 (90 Base) MCG/ACT inhaler Commonly known as: VENTOLIN HFA Inhale 2 puffs into the lungs every 4 (four) hours as needed for wheezing or shortness of breath.   albuterol (2.5 MG/3ML)  0.083% nebulizer solution Commonly known as: PROVENTIL Inhale 3 mLs into the lungs every 6 (six) hours as needed for wheezing or shortness of breath.   aspirin EC 81 MG tablet Take 1 tablet (81 mg total) by mouth daily. Swallow whole. Start taking on: December 24, 2019   azelastine 0.1 % nasal spray Commonly known as: ASTELIN Place 2 sprays into both  nostrils daily as needed for allergies.   docusate sodium 100 MG capsule Commonly known as: COLACE Take 100 mg by mouth at bedtime.   finasteride 5 MG tablet Commonly known as: PROSCAR Take 1 tablet (5 mg total) by mouth daily. What changed: when to take this   montelukast 10 MG tablet Commonly known as: SINGULAIR Take 10 mg by mouth at bedtime.   multivitamin capsule Take 1 capsule by mouth daily.   oxyCODONE 5 MG immediate release tablet Commonly known as: Oxy IR/ROXICODONE Take 1 tablet (5 mg total) by mouth every 6 (six) hours as needed for moderate pain (pain score 4-6).   pantoprazole 40 MG tablet Commonly known as: Protonix Take 1 tablet (40 mg total) by mouth 2 (two) times daily.   polyethylene glycol 17 g packet Commonly known as: MIRALAX / GLYCOLAX Take 17 g by mouth daily. What changed: when to take this   rivaroxaban 20 MG Tabs tablet Commonly known as: Xarelto Take 1 tablet (20 mg total) by mouth daily. Start taking on: December 27, 2019 What changed: These instructions start on December 27, 2019. If you are unsure what to do until then, ask your doctor or other care provider.   Spiriva HandiHaler 18 MCG inhalation capsule Generic drug: tiotropium Place 18 mcg into inhaler and inhale daily as needed (Wheezing). Notes to patient: As needed   traMADol 50 MG tablet Commonly known as: ULTRAM Take 1 tablet (50 mg total) by mouth every 4 (four) hours as needed for moderate pain. Notes to patient: As needed             Discharge Care Instructions  (From admission, onward)         Start     Ordered   12/21/19 0000  No dressing needed        12/21/19 1305         DISCHARGE INSTRUCTIONS:   DIET:  Regular diet DISCHARGE CONDITION:  Stable ACTIVITY:  Activity as tolerated OXYGEN:  Home Oxygen: No.  Oxygen Delivery: room air DISCHARGE LOCATION:  home with home health PT, OT  If you experience worsening of your admission symptoms, develop  shortness of breath, life threatening emergency, suicidal or homicidal thoughts you must seek medical attention immediately by calling 911 or calling your MD immediately  if symptoms less severe.  You Must read complete instructions/literature along with all the possible adverse reactions/side effects for all the Medicines you take and that have been prescribed to you. Take any new Medicines after you have completely understood and accpet all the possible adverse reactions/side effects.   Please note  You were cared for by a hospitalist during your hospital stay. If you have any questions about your discharge medications or the care you received while you were in the hospital after you are discharged, you can call the unit and asked to speak with the hospitalist on call if the hospitalist that took care of you is not available. Once you are discharged, your primary care physician will handle any further medical issues. Please note that NO REFILLS for any discharge medications will be  authorized once you are discharged, as it is imperative that you return to your primary care physician (or establish a relationship with a primary care physician if you do not have one) for your aftercare needs so that they can reassess your need for medications and monitor your lab values.    On the day of Discharge:  VITAL SIGNS:  Blood pressure 120/71, pulse 70, temperature 98 F (36.7 C), temperature source Oral, resp. rate 16, height 5\' 6"  (1.676 m), weight 90.7 kg, SpO2 95 %. PHYSICAL EXAMINATION:  GENERAL:  86 y.o.-year-old patient lying in the bed with no acute distress.  EYES: Pupils equal, round, reactive to light and accommodation. No scleral icterus. Extraocular muscles intact.  HEENT: Head atraumatic, normocephalic. Oropharynx and nasopharynx clear.  NECK:  Supple, no jugular venous distention. No thyroid enlargement, no tenderness.  LUNGS: Normal breath sounds bilaterally, no wheezing, rales,rhonchi or  crepitation. No use of accessory muscles of respiration.  CARDIOVASCULAR: S1, S2 normal. No murmurs, rubs, or gallops.  ABDOMEN: Soft, non-tender, non-distended. Bowel sounds present. No organomegaly or mass.  EXTREMITIES: No pedal edema, cyanosis, or clubbing.  NEUROLOGIC: Cranial nerves II through XII are intact. Muscle strength 5/5 in all extremities. Sensation intact. Gait not checked.  PSYCHIATRIC: The patient is alert and oriented x 3.  SKIN: No obvious rash, lesion, or ulcer.  DATA REVIEW:   CBC Recent Labs  Lab 12/21/19 1451  WBC 11.2*  HGB 9.7*  HCT 29.8*  PLT 288    Chemistries  Recent Labs  Lab 12/20/19 0602 12/20/19 0602 12/21/19 0302  NA 137   < > 136  K 3.5   < > 3.8  CL 108   < > 106  CO2 21*   < > 23  GLUCOSE 159*   < > 94  BUN 24*   < > 22  CREATININE 0.72   < > 0.79  CALCIUM 7.4*   < > 8.1*  AST 16  --   --   ALT 12  --   --   ALKPHOS 41  --   --   BILITOT 0.6  --   --    < > = values in this interval not displayed.     Outpatient follow-up  Follow-up Information    12/23/19, MD. Schedule an appointment as soon as possible for a visit in 1 week(s).   Specialty: Internal Medicine Contact information: [address] [facility] [place] [phone]        [phone] B, PA-C Follow up in 1 week(s).   Specialty: Orthopedic Surgery Contact information: [address] [facility] [place] [phone]                Management plans discussed with the patient, family and they are in agreement.  CODE STATUS: Full Code   TOTAL TIME TAKING CARE OF THIS PATIENT: 45 minutes.    [phone] M.D on 12/21/2019 at 5:17 PM  [facility]   CC: Primary care physician; 12/23/2019, MD   Note: This dictation was prepared with Dragon dictation along with smaller phrase technology. Any transcriptional errors that  result from this process are unintentional.

## 2019-12-21 NOTE — Progress Notes (Signed)
  Subjective: 1 Day Post-Op Procedure(s) (LRB): ESOPHAGOGASTRODUODENOSCOPY (EGD) WITH PROPOFOL (N/A)  S/p L TKA 12/08/19 Patient is doing well, denies chest pain or shortness of breath. Daughter at bedside.  Per MD, patient is to d/c home this afternoon.  Daughter asks about extending HH PT.  Objective: Vital signs in last 24 hours: Temp:  [97.6 F (36.4 C)-98.6 F (37 C)] 98 F (36.7 C) (11/23 1207) Pulse Rate:  [66-86] 70 (11/23 1207) Resp:  [16-21] 16 (11/23 1207) BP: (94-148)/(53-78) 120/71 (11/23 1207) SpO2:  [94 %-100 %] 95 % (11/23 1207)  Intake/Output from previous day:  Intake/Output Summary (Last 24 hours) at 12/21/2019 1411 Last data filed at 12/21/2019 1225 Gross per 24 hour  Intake 1383.84 ml  Output 900 ml  Net 483.84 ml    Intake/Output this shift: Total I/O In: 360 [P.O.:360] Out: 500 [Urine:500]  Labs: Recent Labs    12/20/19 1113 12/20/19 1532 12/20/19 2118 12/21/19 0302 12/21/19 0916  HGB 10.4* 9.9* 9.9* 8.8* 9.0*   Recent Labs    12/21/19 0302 12/21/19 0916  WBC 9.8 9.3  RBC 3.01* 3.11*  HCT 27.5* 28.3*  PLT 236 244   Recent Labs    12/20/19 0602 12/21/19 0302  NA 137 136  K 3.5 3.8  CL 108 106  CO2 21* 23  BUN 24* 22  CREATININE 0.72 0.79  GLUCOSE 159* 94  CALCIUM 7.4* 8.1*   Recent Labs    12/20/19 0602  INR 1.7*     EXAM General - Patient is Alert, Appropriate and Oriented Extremity - mild edema noted over L knee, ice packs in place over dressing Dressing/Incision -Aquacell dressing in place over knee. Following dressing removal, mild sanguinous drainage noted on bandage. No active drainage noted. Motor Function - intact, moving foot and toes well on exam.    Assessment/Plan: 1 Day Post-Op Procedure(s) (LRB): ESOPHAGOGASTRODUODENOSCOPY (EGD) WITH PROPOFOL (N/A) Principal Problem:   Hematemesis Active Problems:   Asthma with COPD (HCC)   BPH (benign prostatic hyperplasia)   Hypercholesteremia   Multiple  subsegmental pulmonary emboli without acute cor pulmonale (HCC)   Anemia   Tobacco abuse   Anticoagulated  Estimated body mass index is 32.27 kg/m as calculated from the following:   Height as of this encounter: 5\' 6"  (1.676 m).   Weight as of this encounter: 90.7 kg. Up with therapy  Staples removed. Steri strips placed over incision. TED hose reapplied. D/c pending medicine approval. Patient is to f/u in the office in 1 week.   Patient is to start 1 81 mg ASA on Friday, and will restart Xarelto on Monday, per medicine. Stressed importance of wearing TED hose, drinking plenty of fluids, and doing heel pumps when sitting. Patient and daughter understand.   Tuesday, PA-C [facility] 12/21/2019, 2:11 PM

## 2019-12-21 NOTE — Care Management Obs Status (Signed)
MEDICARE OBSERVATION STATUS NOTIFICATION   Patient Details  Name: Kyle Wells MRN: [id number] Date of Birth: 01-18-1934   Medicare Observation Status Notification Given:  Yes    Chapman Fitch, RN 12/21/2019, 1:03 PM

## 2019-12-21 NOTE — Plan of Care (Signed)

## 2020-01-06 ENCOUNTER — Other Ambulatory Visit: Payer: Self-pay

## 2020-01-06 ENCOUNTER — Ambulatory Visit (INDEPENDENT_AMBULATORY_CARE_PROVIDER_SITE_OTHER): Payer: Medicare PPO | Admitting: Vascular Surgery

## 2020-01-06 VITALS — BP 126/78 | HR 89 | Ht 66.0 in | Wt 199.0 lb

## 2020-01-06 DIAGNOSIS — Z86711 Personal history of pulmonary embolism: Secondary | ICD-10-CM | POA: Diagnosis not present

## 2020-01-06 DIAGNOSIS — J449 Chronic obstructive pulmonary disease, unspecified: Secondary | ICD-10-CM

## 2020-01-06 DIAGNOSIS — K2101 Gastro-esophageal reflux disease with esophagitis, with bleeding: Secondary | ICD-10-CM | POA: Diagnosis not present

## 2020-01-06 DIAGNOSIS — M170 Bilateral primary osteoarthritis of knee: Secondary | ICD-10-CM | POA: Diagnosis not present

## 2020-01-07 ENCOUNTER — Encounter (INDEPENDENT_AMBULATORY_CARE_PROVIDER_SITE_OTHER): Payer: Self-pay | Admitting: Vascular Surgery

## 2020-01-07 NOTE — Progress Notes (Signed)
MRN : [id number]  Kyle Wells is a 86 y.o. ([birth year]) male who presents with chief complaint of  Chief Complaint  Patient presents with  . Follow-up    [facility] 6 wk no studies post IVC filter   .  History of Present Illness:   Patient returns to the office status post knee replacement with IVC filter insertion before the surgery secondary to history of PE.  He notes that his rehab is going well and he is doing quite nicely with his knee replacement.  However, he has not followed up with Dr. Ernest Pine yet that postoperative visit is next week.  This is important as the patient is considering whether he will move forward with having his other knee replaced.  Current Meds  Medication Sig  . acetaminophen (TYLENOL) 500 MG tablet Take 1,000 mg by mouth in the morning and at bedtime.  Marland Kitchen ADVAIR DISKUS 500-50 MCG/DOSE AEPB Inhale 1 puff into the lungs 2 (two) times daily.   Marland Kitchen albuterol (PROVENTIL HFA;VENTOLIN HFA) 108 (90 Base) MCG/ACT inhaler Inhale 2 puffs into the lungs every 4 (four) hours as needed for wheezing or shortness of breath.  Marland Kitchen albuterol (PROVENTIL) (2.5 MG/3ML) 0.083% nebulizer solution Inhale 3 mLs into the lungs every 6 (six) hours as needed for wheezing or shortness of breath.   Marland Kitchen aspirin EC 81 MG tablet Take 1 tablet (81 mg total) by mouth daily. Swallow whole.  Marland Kitchen azelastine (ASTELIN) 0.1 % nasal spray Place 2 sprays into both nostrils daily as needed for allergies.   Marland Kitchen docusate sodium (COLACE) 100 MG capsule Take 100 mg by mouth at bedtime.  . finasteride (PROSCAR) 5 MG tablet Take 1 tablet (5 mg total) by mouth daily. (Patient taking differently: Take 5 mg by mouth daily with supper.)  . montelukast (SINGULAIR) 10 MG tablet Take 10 mg by mouth at bedtime.   . Multiple Vitamin (MULTIVITAMIN) capsule Take 1 capsule by mouth daily.  . ondansetron (ZOFRAN) 4 MG tablet Take 4 mg by mouth every 8 (eight) hours as needed.  Marland Kitchen oxyCODONE (OXY IR/ROXICODONE) 5 MG immediate release  tablet Take 1 tablet (5 mg total) by mouth every 6 (six) hours as needed for moderate pain (pain score 4-6).  . pantoprazole (PROTONIX) 40 MG tablet Take 1 tablet (40 mg total) by mouth 2 (two) times daily.  . polyethylene glycol (MIRALAX / GLYCOLAX) packet Take 17 g by mouth daily. (Patient taking differently: Take 17 g by mouth at bedtime.)  . rivaroxaban (XARELTO) 20 MG TABS tablet Take 1 tablet (20 mg total) by mouth daily.  Marland Kitchen SPIRIVA HANDIHALER 18 MCG inhalation capsule Place 18 mcg into inhaler and inhale daily as needed (Wheezing).   . traMADol (ULTRAM) 50 MG tablet Take 1 tablet (50 mg total) by mouth every 4 (four) hours as needed for moderate pain.    Past Medical History:  Diagnosis Date  . Arthritis   . Asthma   . BPH (benign prostatic hyperplasia)   . COPD (chronic obstructive pulmonary disease) (HCC)   . Hypercholesterolemia   . Impingement syndrome of right shoulder   . OSA (obstructive sleep apnea)   . Small bowel obstruction [facility])     Past Surgical History:  Procedure Laterality Date  . COLONOSCOPY    . ESOPHAGOGASTRODUODENOSCOPY (EGD) WITH PROPOFOL N/A 12/20/2019   Procedure: ESOPHAGOGASTRODUODENOSCOPY (EGD) WITH PROPOFOL;  Surgeon: Midge Minium, MD;  Location: [facility] END[facility]COPY;  Service: Endoscopy;  Laterality: N/A;  . IVC FILTER INSERTION N/A 11/26/2019  Procedure: IVC FILTER INSERTION;  Surgeon: Renford Dills, MD;  Location: [facility] INVASIVE CV LAB;  Service: Cardiovascular;  Laterality: N/A;  . KNEE ARTHROPLASTY Left 12/08/2019   Procedure: COMPUTER ASSISTED TOTAL KNEE ARTHROPLASTY;  Surgeon: Donato Heinz, MD;  Location: [facility] ORS;  Service: Orthopedics;  Laterality: Left;  . PROSTATE BIOPSY      Social History Social History   Tobacco Use  . Smoking status: Current Some Day Smoker    Packs/day: 0.10    Types: Cigarettes  . Smokeless tobacco: Never Used  Vaping Use  . Vaping Use: Never used  Substance Use Topics  . Alcohol use: No  . Drug use: No     Family History Family History  Problem Relation Age of Onset  . Asthma Father   . Heart attack Father     Allergies  Allergen Reactions  . Zileuton Hives    [Onset: 10/27/2009]    . Roflumilast Other (See Comments)    insomnia   [Onset: 11/29/2010]  Other reaction(s): Other (See Comments) Insomnia     REVIEW OF SYSTEMS (Negative unless checked)  Constitutional: [] Weight loss  [] Fever  [] Chills Cardiac: [] Chest pain   [] Chest pressure   [] Palpitations   [] Shortness of breath when laying flat   [] Shortness of breath with exertion. Vascular:  [] Pain in legs with walking   [] Pain in legs at rest  [x] History of DVT   [] Phlebitis   [x] Swelling in legs   [] Varicose veins   [] Non-healing ulcers Pulmonary:   [] Uses home oxygen   [] Productive cough   [] Hemoptysis   [] Wheeze  [] COPD   [] Asthma Neurologic:  [] Dizziness   [] Seizures   [] History of stroke   [] History of TIA  [] Aphasia   [] Vissual changes   [] Weakness or numbness in arm   [] Weakness or numbness in leg Musculoskeletal:   [] Joint swelling   [x] Joint pain   [] Low back pain Hematologic:  [] Easy bruising  [] Easy bleeding   [] Hypercoagulable state   [] Anemic Gastrointestinal:  [] Diarrhea   [] Vomiting  [] Gastroesophageal reflux/heartburn   [] Difficulty swallowing. Genitourinary:  [] Chronic kidney disease   [] Difficult urination  [] Frequent urination   [] Blood in urine Skin:  [] Rashes   [] Ulcers  Psychological:  [] History of anxiety   []  History of major depression.  Physical Examination  Vitals:   01/06/20 1333  BP: 126/78  Pulse: 89  Weight: 199 lb (90.3 kg)  Height: 5\' 6"  (1.676 m)   Body mass index is 32.12 kg/m. Gen: WD/WN, NAD Head: [place]/AT, No temporalis wasting.  Ear/Nose/Throat: Hearing grossly intact, nares w/o erythema or drainage Eyes: PER, EOMI, sclera nonicteric.  Neck: Supple, no large masses.   Pulmonary:  Good air movement, no audible wheezing bilaterally, no use of accessory muscles.  Cardiac: RRR,  no JVD Vascular:  Vessel Right Left  Radial Palpable Palpable  Gastrointestinal: Non-distended. No guarding/no peritoneal signs.  Musculoskeletal: M/S 5/5 throughout.  No deformity or atrophy.  Neurologic: CN 2-12 intact. Symmetrical.  Speech is fluent. Motor exam as listed above. Psychiatric: Judgment intact, Mood & affect appropriate for pt's clinical situation. Dermatologic: No rashes or ulcers noted.  No changes consistent with cellulitis.  CBC Lab Results  Component Value Date   WBC 11.2 (H) 12/21/2019   HGB 9.7 (L) 12/21/2019   HCT 29.8 (L) 12/21/2019   MCV 90.0 12/21/2019   PLT 288 12/21/2019    BMET    Component Value Date/Time   NA 136 12/21/2019 0302   NA 135 (L) 08/02/2013 0315  K 3.8 12/21/2019 0302   K 4.0 08/02/2013 0315   CL 106 12/21/2019 0302   CL 100 08/02/2013 0315   CO2 23 12/21/2019 0302   CO2 27 08/02/2013 0315   GLUCOSE 94 12/21/2019 0302   GLUCOSE 123 (H) 08/02/2013 0315   BUN 22 12/21/2019 0302   BUN 19 (H) 08/02/2013 0315   CREATININE 0.79 12/21/2019 0302   CREATININE 1.08 08/02/2013 0315   CALCIUM 8.1 (L) 12/21/2019 0302   CALCIUM 8.7 08/02/2013 0315   GFRNONAA >60 12/21/2019 0302   GFRNONAA >60 08/02/2013 0315   GFRAA >60 05/29/2019 0456   GFRAA >60 08/02/2013 0315   Estimated Creatinine Clearance: 72.3 mL/min (by C-G formula based on SCr of 0.79 mg/dL).  COAG Lab Results  Component Value Date   INR 1.7 (H) 12/20/2019   INR 2.1 (H) 11/29/2019   INR 0.9 04/13/2018    Radiology DG Knee Left Port  Result Date: 12/08/2019 CLINICAL DATA:  86 year old male status post total left knee replacement. EXAM: PORTABLE LEFT KNEE - 1-2 VIEW COMPARISON:  None. FINDINGS: There is a total knee arthroplasty. The arthroplasty components appear intact and in anatomic alignment. There is no acute fracture or dislocation. The bones are osteopenic. No large effusion. Drainage catheter noted in the suprapatellar space. Anterior knee cutaneous surgical  clips. IMPRESSION: Status post total knee arthroplasty. No acute fracture or dislocation. Electronically Signed   By: Elgie Collard M.D.   On: 12/08/2019 16:16   CT Angio Abd/Pel W and/or Wo Contrast  Result Date: 12/20/2019 CLINICAL DATA:  86 year old male presenting with coffee-ground emesis. EXAM: CTA ABDOMEN AND PELVIS WITHOUT AND WITH CONTRAST TECHNIQUE: Multidetector CT imaging of the abdomen and pelvis was performed using the standard protocol before, during, and after bolus administration of intravenous contrast. Multiplanar reconstructed images and MIPs were obtained and reviewed to evaluate the vascular anatomy. CONTRAST:  OMNIPAQUE IOHEXOL 350 MG/ML SOLN COMPARISON:  05/28/2019 FINDINGS: VASCULAR Aorta: Normal caliber aorta without aneurysm, dissection, vasculitis or significant stenosis. Scattered atherosclerotic calcifications in the distal aorta. Celiac: Patent without evidence of aneurysm, dissection, vasculitis or significant stenosis. SMA: Patent without evidence of aneurysm, dissection, vasculitis or significant stenosis. Of note, there is a replaced right hepatic artery arising from the proximal SMA. Renals: Single bilateral renal arteries are patent without evidence of aneurysm, dissection, vasculitis, fibromuscular dysplasia or significant stenosis. IMA: Patent without evidence of aneurysm, dissection, vasculitis or significant stenosis. Inflow: The left distal common iliac artery is demonstrates fusiform ectasia measuring up to approximately 15 mm. Mild scattered atherosclerotic calcifications bilaterally. Otherwise widely patent throughout. Proximal Outflow: Bilateral common femoral and visualized portions of the superficial and profunda femoral arteries are patent without evidence of aneurysm, dissection, vasculitis or significant stenosis. Veins: Infrarenal IVC filter in place with mild extravascular extrusion of 2 of the right sided supporting struts. No evidence of iliocaval  thrombosis. Review of the MIP images confirms the above findings. NON-VASCULAR Lower chest: Bibasilar subsegmental atelectasis. The heart is normal in size. No pericardial effusion. Hepatobiliary: No focal liver abnormality is seen. No gallstones, gallbladder wall thickening, or biliary dilatation. Pancreas: Unremarkable. No pancreatic ductal dilatation or surrounding inflammatory changes. Spleen: Normal in size without focal abnormality. Adrenals/Urinary Tract: Adrenal glands are unremarkable. Similar appearing 1.7 cm simple cyst in the right upper pole. Kidneys are otherwise normal, without renal calculi, focal lesion, or hydronephrosis. Bladder is distended with a thin wall. Stomach/Bowel: No evidence of gastrointestinal hemorrhage. Mildly patulous distal esophagus with thickened, hyperemic wall. Stomach is within  normal limits. Appendix appears normal. Similar appearing extensive colonic diverticula. No evidence of bowel wall thickening, distention, or inflammatory changes. Lymphatic: No abdominopelvic lymphadenopathy. Reproductive: The prostate measures to 6.1 cm in maximum axial dimension, unchanged. Other: No abdominal wall hernia or abnormality. No abdominopelvic ascites. Musculoskeletal: Multilevel degenerative changes of the lumbar spine, most prominent at L2-L3. Bone islands are again visualized in the left acetabulum. No acute osseous abnormality. No aggressive appearing osseous lesion. IMPRESSION: VASCULAR No evidence of gastrointestinal hemorrhage. NON-VASCULAR 1. Mildly patulous and hyperemic distal esophagus. Consider endoscopic evaluation. 2. Prostatomegaly and chronic dilation of urinary bladder, likely indicative of bladder outlet obstruction. 3. Diverticulosis. Marliss Cootsylan Suttle, MD [facility] Electronically Signed   By: Marliss Cootsylan  Suttle MD   On: 12/20/2019 08:32     Assessment/Plan 1. History of pulmonary embolism I have once again  discussed IVC filter removal.  Both the procedure as well as the risks and benefits.  His daughter is present for this discussion.  At this time they are uncertain as to whether he is going to move forward in the next month or 2 with total knee replacement of the other side.  They have yet to follow-up with Dr. Ernest PineHooten postop.  If this is the plan that I would leave the filter in and remove it 1 month after the second joint surgery.  If not I would go ahead and remove the filter at the patient's convenience.  I have asked the patient to call the office and let them know what his decision will be so we can appropriately plan the timing of filter removal.  2. Primary osteoarthritis of both knees Plan per Dr. Ernest PineHooten  3. Asthma with COPD (HCC) Continue pulmonary medications and aerosols as already ordered, these medications have been reviewed and there are no changes at this time.  4. Gastro-esophageal reflux disease with esophagitis, with bleeding Continue PPI as already ordered, this medication has been reviewed and there are no changes at this time.  Avoidence of caffeine and alcohol  Moderate elevation of the head of the bed      Levora DredgeGregory Annalisia Ingber, MD  01/07/2020 10:21 AM

## 2020-03-03 ENCOUNTER — Other Ambulatory Visit: Payer: Medicare PPO

## 2020-03-09 ENCOUNTER — Other Ambulatory Visit: Payer: Medicare PPO

## 2020-03-13 ENCOUNTER — Inpatient Hospital Stay: Admission: RE | Admit: 2020-03-13 | Payer: Medicare PPO | Source: Home / Self Care | Admitting: Orthopedic Surgery

## 2020-03-13 ENCOUNTER — Encounter: Admission: RE | Payer: Self-pay | Source: Home / Self Care

## 2020-03-13 SURGERY — ARTHROPLASTY, KNEE, TOTAL, USING IMAGELESS COMPUTER-ASSISTED NAVIGATION
Anesthesia: Choice | Site: Knee | Laterality: Right

## 2020-04-15 NOTE — Discharge Instructions (Signed)
Instructions after Total Knee Replacement   Brodie P. Barnard Sharps, Jr., M.D.     Dept. of Orthopaedics & Sports Medicine  [facility]  [address]  [place], [place]  [address]  Phone: [phone]   Fax: [phone]    DIET: Drink plenty of non-alcoholic fluids. Resume your normal diet. Include foods high in fiber.  ACTIVITY:  You may use crutches or a walker with weight-bearing as tolerated, unless instructed otherwise. You may be weaned off of the walker or crutches by your Physical Therapist.  Do NOT place pillows under the knee. Anything placed under the knee could limit your ability to straighten the knee.   Continue doing gentle exercises. Exercising will reduce the pain and swelling, increase motion, and prevent muscle weakness.   Please continue to use the TED compression stockings for 6 weeks. You may remove the stockings at night, but should reapply them in the morning. Do not drive or operate any equipment until instructed.  WOUND CARE:  Continue to use the PolarCare or ice packs periodically to reduce pain and swelling. You may bathe or shower after the staples are removed at the first office visit following surgery.  MEDICATIONS: You may resume your regular medications. Please take the pain medication as prescribed on the medication. Do not take pain medication on an empty stomach. You have been given a prescription for a blood thinner (Lovenox or Coumadin). Please take the medication as instructed. (NOTE: After completing a 2 week course of Lovenox, take one Enteric-coated aspirin once a day. This along with elevation will help reduce the possibility of phlebitis in your operated leg.) Do not drive or drink alcoholic beverages when taking pain medications.  CALL THE OFFICE FOR: Temperature above 101 degrees Excessive bleeding or drainage on the dressing. Excessive swelling, coldness, or paleness of the toes. Persistent nausea and vomiting.  FOLLOW-UP:  You  should have an appointment to return to the office in 10-14 days after surgery. Arrangements have been made for continuation of Physical Therapy (either home therapy or outpatient therapy).   [facility] Department Directory         [email or web address]       [email or web address]          Cardiology  Appointments: [place] - [phone] [place] - [phone]  Endocrinology  Appointments: [place] - [phone] [place] - [phone]  Gastroenterology  Appointments: [place] - [phone] [place] - [phone]        General Surgery   Appointments: [place] - [phone]  Internal Medicine/Family Medicine  Appointments: [place] - [phone] [place] - [phone] [place] - [phone]  Metabolic and Weigh Loss Surgery  Appointments: [place] - [phone]        Neurology  Appointments: [place] - [phone] [place] - [phone]  Neurosurgery  Appointments: [place] - [phone]  Obstetrics & Gynecology  Appointments: [place] - [phone] [place] - [phone]        Pediatrics  Appointments: [place] - [phone] [place] - [phone]  Physiatry  Appointments: [place] -[phone]  Physical Therapy  Appointments: [place] - [phone] [place] - [phone]        Podiatry  Appointments: [place] - [phone] [place] - [phone]  Pulmonology  Appointments: [place] - [phone]  Rheumatology  Appointments: [place] - [phone]        [place] Location: [facility]  [address] [place], [place]  [address]  [place] Location: [facility] [address] [place], [place]  [address]  [place] Location: [facility] [address] [place], [place]  [address]    

## 2020-04-20 ENCOUNTER — Other Ambulatory Visit: Payer: Self-pay

## 2020-04-20 ENCOUNTER — Other Ambulatory Visit
Admission: RE | Admit: 2020-04-20 | Discharge: 2020-04-20 | Disposition: A | Discharge status: Home / Self Care | Payer: Medicare PPO | Source: Ambulatory Visit | Attending: Orthopedic Surgery | Admitting: Orthopedic Surgery

## 2020-04-20 DIAGNOSIS — Z01818 Encounter for other preprocedural examination: Secondary | ICD-10-CM | POA: Diagnosis present

## 2020-04-20 LAB — COMPREHENSIVE METABOLIC PANEL
ALT: 20 U/L (ref 0–44)
AST: 24 U/L (ref 15–41)
Albumin: 3.8 g/dL (ref 3.5–5.0)
Alkaline Phosphatase: 47 U/L (ref 38–126)
Anion gap: 7 (ref 5–15)
BUN: 20 mg/dL (ref 8–23)
CO2: 25 mmol/L (ref 22–32)
Calcium: 8.9 mg/dL (ref 8.9–10.3)
Chloride: 105 mmol/L (ref 98–111)
Creatinine, Ser: 0.78 mg/dL (ref 0.61–1.24)
GFR, Estimated: >60 mL/min (ref >60)
Glucose, Bld: 101 mg/dL — ABNORMAL HIGH (ref 70–99)
Potassium: 3.9 mmol/L (ref 3.5–5.1)
Sodium: 137 mmol/L (ref 135–145)
Total Bilirubin: 0.7 mg/dL (ref 0.3–1.2)
Total Protein: 7.1 g/dL (ref 6.5–8.1)

## 2020-04-20 LAB — URINALYSIS, ROUTINE W REFLEX MICROSCOPIC
Bilirubin Urine: NEGATIVE
Glucose, UA: NEGATIVE mg/dL
Hgb urine dipstick: NEGATIVE
Ketones, ur: NEGATIVE mg/dL
Leukocytes,Ua: NEGATIVE
Nitrite: NEGATIVE
Protein, ur: NEGATIVE mg/dL
Specific Gravity, Urine: 1.027 (ref 1.005–1.030)
pH: 5 (ref 5.0–8.0)

## 2020-04-20 LAB — TYPE AND SCREEN
ABO/RH(D): O POS
Antibody Screen: NEGATIVE

## 2020-04-20 LAB — LIPID PANEL
Cholesterol: 155 mg/dL (ref 0–200)
HDL: 61 mg/dL (ref >40)
LDL Cholesterol: 73 mg/dL (ref 0–99)
Total CHOL/HDL Ratio: 2.5 RATIO
Triglycerides: 104 mg/dL (ref <150)
VLDL: 21 mg/dL (ref 0–40)

## 2020-04-20 LAB — CBC
HCT: 31.9 % — ABNORMAL LOW (ref 39.0–52.0)
Hemoglobin: 9.5 g/dL — ABNORMAL LOW (ref 13.0–17.0)
MCH: 22 pg — ABNORMAL LOW (ref 26.0–34.0)
MCHC: 29.8 g/dL — ABNORMAL LOW (ref 30.0–36.0)
MCV: 73.8 fL — ABNORMAL LOW (ref 80.0–100.0)
Platelets: 290 10*3/uL (ref 150–400)
RBC: 4.32 MIL/uL (ref 4.22–5.81)
RDW: 18.4 % — ABNORMAL HIGH (ref 11.5–15.5)
WBC: 8.4 10*3/uL (ref 4.0–10.5)
nRBC: 0 % (ref 0.0–0.2)

## 2020-04-20 LAB — SEDIMENTATION RATE: Sed Rate: 12 mm/hr (ref 0–20)

## 2020-04-20 LAB — PROTIME-INR
INR: 1.2 (ref 0.8–1.2)
Prothrombin Time: 14.7 seconds (ref 11.4–15.2)

## 2020-04-20 LAB — APTT: aPTT: 33 seconds (ref 24–36)

## 2020-04-20 LAB — C-REACTIVE PROTEIN: CRP: 0.5 mg/dL (ref <1.0)

## 2020-04-20 LAB — HEMOGLOBIN A1C
Hgb A1c MFr Bld: 6 % — ABNORMAL HIGH (ref 4.8–5.6)
Mean Plasma Glucose: 125.5 mg/dL

## 2020-04-20 LAB — SURGICAL PCR SCREEN
MRSA, PCR: NEGATIVE
Staphylococcus aureus: NEGATIVE

## 2020-04-20 NOTE — Patient Instructions (Addendum)
Your procedure is scheduled on: 05/01/20 - Monday Report to the Registration Desk on the 1st floor of the [facility]. To find out your arrival time, please call [phone] between 1PM - 3PM on: 04/28/20- Friday  Report to [facility] on 04/27/20 for Covid testing between 8 am and 2 pm  REMEMBER: Instructions that are not followed completely may result in serious medical risk, up to and including death; or upon the discretion of your surgeon and anesthesiologist your surgery may need to be rescheduled.  Do not eat food after midnight the night before surgery.  No gum chewing, lozengers or hard candies.  You may however, drink CLEAR liquids up to 2 hours before you are scheduled to arrive for your surgery. Do not drink anything within 2 hours of your scheduled arrival time.  Clear liquids include: - water  - apple juice without pulp - gatorade (not RED, PURPLE, OR BLUE) - black coffee or tea (Do NOT add milk or creamers to the coffee or tea) Do NOT drink anything that is not on this list.  Type 1 and Type 2 diabetics should only drink water.  In addition, your doctor has ordered for you to drink the provided  Ensure Pre-Surgery Clear Carbohydrate Drink  Drinking this carbohydrate drink up to two hours before surgery helps to reduce insulin resistance and improve patient outcomes. Please complete drinking 2 hours prior to scheduled arrival time.  TAKE THESE MEDICATIONS THE MORNING OF SURGERY WITH A SIP OF WATER:  - acetaminophen (TYLENOL) 650 MG CR tablet - ADVAIR DISKUS 500-50 MCG/DOSE AEPB - pantoprazole (PROTONIX) 40 MG tablet, take one the night before and one on the morning of surgery - helps to prevent nausea after surgery. - SPIRIVA HANDIHALER 18 MCG inhalation capsule  Use inhaler albuterol (PROVENTIL HFA;VENTOLIN HFA) 108 (90 Base) MCG/ACT inhaler on the day of surgery and bring to the hospital.   - Do Not take  rivaroxaban (XARELTO) 20 on 04/01, 04/02, 04/03,  and do  not take the morning of surgery.   One week prior to surgery: Stop Anti-inflammatories (NSAIDS) such as Advil, Aleve, Ibuprofen, Motrin, Naproxen, Naprosyn and Aspirin based products such as Excedrin, Goodys Powder, BC Powder.  Stop ANY OVER THE COUNTER supplements until after surgery.  No Alcohol for 24 hours before or after surgery.  No Smoking including e-cigarettes for 24 hours prior to surgery.  No chewable tobacco products for at least 6 hours prior to surgery.  No nicotine patches on the day of surgery.  Do not use any "recreational" drugs for at least a week prior to your surgery.  Please be advised that the combination of cocaine and anesthesia may have negative outcomes, up to and including death. If you test positive for cocaine, your surgery will be cancelled.  On the morning of surgery brush your teeth with toothpaste and water, you may rinse your mouth with mouthwash if you wish. Do not swallow any toothpaste or mouthwash.  Do not wear jewelry, make-up, hairpins, clips or nail polish.  Do not wear lotions, powders, or perfumes.   Do not shave body from the neck down 48 hours prior to surgery just in case you cut yourself which could leave a site for infection.  Also, freshly shaved skin may become irritated if using the CHG soap.  Contact lenses, hearing aids and dentures may not be worn into surgery.  Do not bring valuables to the hospital. [facility] is not responsible for any missing/lost belongings or  valuables.   Use CHG Soap or wipes as directed on instruction sheet.  Bring your C-PAP to the hospital with you in case you may have to spend the night.   Notify your doctor if there is any change in your medical condition (cold, fever, infection).  Wear comfortable clothing (specific to your surgery type) to the hospital.  Plan for stool softeners for home use; pain medications have a tendency to cause constipation. You can also help prevent constipation by  eating foods high in fiber such as fruits and vegetables and drinking plenty of fluids as your diet allows.  After surgery, you can help prevent lung complications by doing breathing exercises.  Take deep breaths and cough every 1-2 hours. Your doctor may order a device called an Incentive Spirometer to help you take deep breaths. When coughing or sneezing, hold a pillow firmly against your incision with both hands. This is called "splinting." Doing this helps protect your incision. It also decreases belly discomfort.  If you are being admitted to the hospital overnight, leave your suitcase in the car. After surgery it may be brought to your room.  If you are being discharged the day of surgery, you will not be allowed to drive home. You will need a responsible adult (18 years or older) to drive you home and stay with you that night.   If you are taking public transportation, you will need to have a responsible adult (18 years or older) with you. Please confirm with your physician that it is acceptable to use public transportation.   Please call the Pre-admissions Testing Dept. at [phone] if you have any questions about these instructions.  Surgery Visitation Policy:  Patients undergoing a surgery or procedure may have one family member or support person with them as long as that person is not COVID-19 positive or experiencing its symptoms.  That person may remain in the waiting area during the procedure.  Inpatient Visitation:    Visiting hours are 7 a.m. to 8 p.m. Inpatients will be allowed two visitors daily. The visitors may change each day during the patient's stay. No visitors under the age of 35. Any visitor under the age of 22 must be accompanied by an adult. The visitor must pass COVID-19 screenings, use hand sanitizer when entering and exiting the patient's room and wear a mask at all times, including in the patient's room. Patients must also wear a mask when staff or  their visitor are in the room. Masking is required regardless of vaccination status.

## 2020-04-21 LAB — URINE CULTURE: Special Requests: NORMAL

## 2020-04-27 ENCOUNTER — Other Ambulatory Visit
Admission: RE | Admit: 2020-04-27 | Discharge: 2020-04-27 | Disposition: A | Discharge status: Home / Self Care | Payer: Medicare PPO | Source: Ambulatory Visit | Attending: Orthopedic Surgery | Admitting: Orthopedic Surgery

## 2020-04-27 ENCOUNTER — Other Ambulatory Visit: Payer: Self-pay

## 2020-04-27 DIAGNOSIS — Z01812 Encounter for preprocedural laboratory examination: Secondary | ICD-10-CM | POA: Insufficient documentation

## 2020-04-27 DIAGNOSIS — Z20822 Contact with and (suspected) exposure to covid-19: Secondary | ICD-10-CM | POA: Insufficient documentation

## 2020-04-27 LAB — SARS CORONAVIRUS 2 (TAT 6-24 HRS): SARS Coronavirus 2: NEGATIVE

## 2020-04-30 MED ORDER — CHLORHEXIDINE GLUCONATE 0.12 % MT SOLN: 15.0000 mL | Freq: Once | OROMUCOSAL | Status: AC

## 2020-04-30 MED ORDER — CHLORHEXIDINE GLUCONATE 4 % EX LIQD: 60.0000 mL | Freq: Once | CUTANEOUS | Status: DC

## 2020-04-30 MED ORDER — TRANEXAMIC ACID-NACL 1000-0.7 MG/100ML-% IV SOLN
1000.0000 mg | INTRAVENOUS | Status: AC
  Administered 2020-05-01: 1000 mg via INTRAVENOUS

## 2020-04-30 MED ORDER — LACTATED RINGERS IV SOLN: INTRAVENOUS | Status: DC

## 2020-04-30 MED ORDER — CELECOXIB 200 MG PO CAPS: 400.0000 mg | ORAL_CAPSULE | Freq: Once | ORAL | Status: AC

## 2020-04-30 MED ORDER — ORAL CARE MOUTH RINSE: 15.0000 mL | Freq: Once | OROMUCOSAL | Status: AC

## 2020-04-30 MED ORDER — GABAPENTIN 300 MG PO CAPS: 300.0000 mg | ORAL_CAPSULE | Freq: Once | ORAL | Status: AC

## 2020-04-30 MED ORDER — CEFAZOLIN SODIUM-DEXTROSE 2-4 GM/100ML-% IV SOLN
2.0000 g | INTRAVENOUS | Status: AC
  Administered 2020-05-01: 2 g via INTRAVENOUS

## 2020-04-30 MED ORDER — DEXAMETHASONE SODIUM PHOSPHATE 10 MG/ML IJ SOLN: 8.0000 mg | Freq: Once | INTRAMUSCULAR | Status: AC

## 2020-05-01 ENCOUNTER — Ambulatory Visit: Payer: Medicare PPO | Admitting: Urgent Care

## 2020-05-01 ENCOUNTER — Encounter
Admission: RE | Disposition: A | Discharge status: Home / Self Care | Payer: Self-pay | Source: Home / Self Care | Attending: Orthopedic Surgery

## 2020-05-01 ENCOUNTER — Encounter: Payer: Self-pay | Admitting: Orthopedic Surgery

## 2020-05-01 ENCOUNTER — Other Ambulatory Visit: Payer: Self-pay

## 2020-05-01 ENCOUNTER — Observation Stay: Payer: Medicare PPO

## 2020-05-01 ENCOUNTER — Observation Stay
Admission: RE | Admit: 2020-05-01 | Discharge: 2020-05-02 | Disposition: A | Discharge status: Home / Self Care | Payer: Medicare PPO | Attending: Orthopedic Surgery | Admitting: Orthopedic Surgery

## 2020-05-01 DIAGNOSIS — M1711 Unilateral primary osteoarthritis, right knee: Secondary | ICD-10-CM | POA: Diagnosis not present

## 2020-05-01 DIAGNOSIS — Z79899 Other long term (current) drug therapy: Secondary | ICD-10-CM | POA: Insufficient documentation

## 2020-05-01 DIAGNOSIS — Z96659 Presence of unspecified artificial knee joint: Secondary | ICD-10-CM

## 2020-05-01 DIAGNOSIS — Z96652 Presence of left artificial knee joint: Secondary | ICD-10-CM | POA: Diagnosis not present

## 2020-05-01 DIAGNOSIS — M25561 Pain in right knee: Secondary | ICD-10-CM | POA: Diagnosis present

## 2020-05-01 DIAGNOSIS — J439 Emphysema, unspecified: Secondary | ICD-10-CM | POA: Diagnosis not present

## 2020-05-01 DIAGNOSIS — J45909 Unspecified asthma, uncomplicated: Secondary | ICD-10-CM | POA: Diagnosis not present

## 2020-05-01 DIAGNOSIS — F17210 Nicotine dependence, cigarettes, uncomplicated: Secondary | ICD-10-CM | POA: Insufficient documentation

## 2020-05-01 HISTORY — PX: KNEE ARTHROPLASTY: SHX992

## 2020-05-01 SURGERY — ARTHROPLASTY, KNEE, TOTAL, USING IMAGELESS COMPUTER-ASSISTED NAVIGATION
Anesthesia: Spinal | Site: Knee | Laterality: Right

## 2020-05-01 MED ORDER — IPRATROPIUM-ALBUTEROL 0.5-2.5 (3) MG/3ML IN SOLN
RESPIRATORY_TRACT | Status: AC
  Filled 2020-05-01: qty 3

## 2020-05-01 MED ORDER — BUPIVACAINE HCL (PF) 0.5 % IJ SOLN
INTRAMUSCULAR | Status: AC
  Filled 2020-05-01: qty 10

## 2020-05-01 MED ORDER — ALUM & MAG HYDROXIDE-SIMETH 200-200-20 MG/5ML PO SUSP: 30.0000 mL | ORAL | Status: DC | PRN

## 2020-05-01 MED ORDER — CEFAZOLIN SODIUM-DEXTROSE 2-4 GM/100ML-% IV SOLN
INTRAVENOUS | Status: AC
  Filled 2020-05-01: qty 100

## 2020-05-01 MED ORDER — FENTANYL CITRATE (PF) 100 MCG/2ML IJ SOLN: 25.0000 ug | INTRAMUSCULAR | Status: DC | PRN

## 2020-05-01 MED ORDER — MOMETASONE FURO-FORMOTEROL FUM 200-5 MCG/ACT IN AERO
2.0000 | INHALATION_SPRAY | Freq: Two times a day (BID) | RESPIRATORY_TRACT | Status: DC
  Administered 2020-05-02: 2 via RESPIRATORY_TRACT
  Filled 2020-05-01 (×2): qty 8.8

## 2020-05-01 MED ORDER — PROPOFOL 10 MG/ML IV BOLUS
INTRAVENOUS | Status: DC | PRN
  Administered 2020-05-01: 40 mg via INTRAVENOUS
  Administered 2020-05-01: 30 mg via INTRAVENOUS

## 2020-05-01 MED ORDER — AZELASTINE HCL 0.1 % NA SOLN
2.0000 | Freq: Every day | NASAL | Status: DC | PRN
  Filled 2020-05-01: qty 30

## 2020-05-01 MED ORDER — BUPIVACAINE HCL (PF) 0.25 % IJ SOLN
INTRAMUSCULAR | Status: DC | PRN
  Administered 2020-05-01: 60 mL

## 2020-05-01 MED ORDER — TRANEXAMIC ACID-NACL 1000-0.7 MG/100ML-% IV SOLN
INTRAVENOUS | Status: AC
  Filled 2020-05-01: qty 100

## 2020-05-01 MED ORDER — PROPOFOL 1000 MG/100ML IV EMUL
INTRAVENOUS | Status: AC
  Filled 2020-05-01: qty 100

## 2020-05-01 MED ORDER — PHENYLEPHRINE HCL (PRESSORS) 10 MG/ML IV SOLN
INTRAVENOUS | Status: DC | PRN
  Administered 2020-05-01: 50 ug via INTRAVENOUS
  Administered 2020-05-01: 200 ug via INTRAVENOUS
  Administered 2020-05-01 (×3): 100 ug via INTRAVENOUS
  Administered 2020-05-01 (×3): 150 ug via INTRAVENOUS

## 2020-05-01 MED ORDER — NEOMYCIN-POLYMYXIN B GU 40-200000 IR SOLN
Status: DC | PRN
  Administered 2020-05-01: 16 mL

## 2020-05-01 MED ORDER — CHLORHEXIDINE GLUCONATE 0.12 % MT SOLN
OROMUCOSAL | Status: AC
  Administered 2020-05-01: 15 mL via OROMUCOSAL
  Filled 2020-05-01: qty 15

## 2020-05-01 MED ORDER — HYDROMORPHONE HCL 1 MG/ML IJ SOLN: 0.5000 mg | INTRAMUSCULAR | Status: DC | PRN

## 2020-05-01 MED ORDER — RIVAROXABAN 20 MG PO TABS
20.0000 mg | ORAL_TABLET | Freq: Every day | ORAL | Status: DC
  Administered 2020-05-02: 20 mg via ORAL
  Filled 2020-05-01 (×2): qty 1

## 2020-05-01 MED ORDER — EPHEDRINE 5 MG/ML INJ
INTRAVENOUS | Status: AC
  Filled 2020-05-01: qty 10

## 2020-05-01 MED ORDER — MENTHOL 3 MG MT LOZG
1.0000 | LOZENGE | OROMUCOSAL | Status: DC | PRN
  Filled 2020-05-01: qty 9

## 2020-05-01 MED ORDER — ACETAMINOPHEN 10 MG/ML IV SOLN
1000.0000 mg | Freq: Four times a day (QID) | INTRAVENOUS | Status: AC
  Administered 2020-05-01 – 2020-05-02 (×4): 1000 mg via INTRAVENOUS
  Filled 2020-05-01 (×4): qty 100

## 2020-05-01 MED ORDER — METOCLOPRAMIDE HCL 10 MG PO TABS
10.0000 mg | ORAL_TABLET | Freq: Three times a day (TID) | ORAL | Status: DC
  Administered 2020-05-01 – 2020-05-02 (×3): 10 mg via ORAL
  Filled 2020-05-01 (×3): qty 1

## 2020-05-01 MED ORDER — ONDANSETRON HCL 4 MG PO TABS: 4.0000 mg | ORAL_TABLET | Freq: Four times a day (QID) | ORAL | Status: DC | PRN

## 2020-05-01 MED ORDER — FLEET ENEMA 7-19 GM/118ML RE ENEM: 1.0000 | ENEMA | Freq: Once | RECTAL | Status: DC | PRN

## 2020-05-01 MED ORDER — ACETAMINOPHEN 10 MG/ML IV SOLN
INTRAVENOUS | Status: DC | PRN
  Administered 2020-05-01: 1000 mg via INTRAVENOUS

## 2020-05-01 MED ORDER — DIPHENHYDRAMINE HCL 12.5 MG/5ML PO ELIX: 12.5000 mg | ORAL_SOLUTION | ORAL | Status: DC | PRN

## 2020-05-01 MED ORDER — CEFAZOLIN SODIUM-DEXTROSE 2-4 GM/100ML-% IV SOLN
2.0000 g | Freq: Four times a day (QID) | INTRAVENOUS | Status: AC
  Administered 2020-05-01 – 2020-05-02 (×2): 2 g via INTRAVENOUS
  Filled 2020-05-01 (×2): qty 100

## 2020-05-01 MED ORDER — OXYCODONE HCL 5 MG PO TABS
5.0000 mg | ORAL_TABLET | ORAL | Status: DC | PRN
Start: 2020-05-01 — End: 2020-05-03
  Administered 2020-05-02: 5 mg via ORAL
  Filled 2020-05-01: qty 1

## 2020-05-01 MED ORDER — CELECOXIB 200 MG PO CAPS
200.0000 mg | ORAL_CAPSULE | Freq: Two times a day (BID) | ORAL | Status: DC
  Administered 2020-05-01: 200 mg via ORAL
  Filled 2020-05-01: qty 1

## 2020-05-01 MED ORDER — IPRATROPIUM-ALBUTEROL 0.5-2.5 (3) MG/3ML IN SOLN
3.0000 mL | RESPIRATORY_TRACT | Status: DC
  Administered 2020-05-01: 3 mL via RESPIRATORY_TRACT

## 2020-05-01 MED ORDER — PHENOL 1.4 % MT LIQD
1.0000 | OROMUCOSAL | Status: DC | PRN
  Filled 2020-05-01: qty 177

## 2020-05-01 MED ORDER — MAGNESIUM HYDROXIDE 400 MG/5ML PO SUSP
30.0000 mL | Freq: Every day | ORAL | Status: DC
  Administered 2020-05-01 – 2020-05-02 (×2): 30 mL via ORAL
  Filled 2020-05-01 (×2): qty 30

## 2020-05-01 MED ORDER — PROPOFOL 500 MG/50ML IV EMUL
INTRAVENOUS | Status: DC | PRN
  Administered 2020-05-01: 100 ug/kg/min via INTRAVENOUS

## 2020-05-01 MED ORDER — SODIUM CHLORIDE 0.9 % IV SOLN: INTRAVENOUS | Status: DC

## 2020-05-01 MED ORDER — TRANEXAMIC ACID-NACL 1000-0.7 MG/100ML-% IV SOLN
1000.0000 mg | Freq: Once | INTRAVENOUS | Status: AC
  Administered 2020-05-01: 1000 mg via INTRAVENOUS

## 2020-05-01 MED ORDER — TRAMADOL HCL 50 MG PO TABS: 50.0000 mg | ORAL_TABLET | ORAL | Status: DC | PRN

## 2020-05-01 MED ORDER — SURGIPHOR WOUND IRRIGATION SYSTEM - OPTIME
TOPICAL | Status: DC | PRN
  Administered 2020-05-01: 1 via TOPICAL

## 2020-05-01 MED ORDER — SODIUM CHLORIDE 0.9 % IV SOLN
INTRAVENOUS | Status: DC | PRN
  Administered 2020-05-01: 25 ug/min via INTRAVENOUS

## 2020-05-01 MED ORDER — ALBUTEROL SULFATE (2.5 MG/3ML) 0.083% IN NEBU: 3.0000 mL | INHALATION_SOLUTION | Freq: Four times a day (QID) | RESPIRATORY_TRACT | Status: DC | PRN

## 2020-05-01 MED ORDER — GABAPENTIN 300 MG PO CAPS
ORAL_CAPSULE | ORAL | Status: AC
  Administered 2020-05-01: 300 mg via ORAL
  Filled 2020-05-01: qty 1

## 2020-05-01 MED ORDER — ACETAMINOPHEN 10 MG/ML IV SOLN
INTRAVENOUS | Status: AC
  Filled 2020-05-01: qty 100

## 2020-05-01 MED ORDER — FERROUS SULFATE 325 (65 FE) MG PO TABS
325.0000 mg | ORAL_TABLET | Freq: Two times a day (BID) | ORAL | Status: DC
  Administered 2020-05-01 – 2020-05-02 (×2): 325 mg via ORAL
  Filled 2020-05-01 (×2): qty 1

## 2020-05-01 MED ORDER — KETAMINE HCL 50 MG/5ML IJ SOSY
PREFILLED_SYRINGE | INTRAMUSCULAR | Status: AC
  Filled 2020-05-01: qty 5

## 2020-05-01 MED ORDER — OXYCODONE HCL 5 MG PO TABS
10.0000 mg | ORAL_TABLET | ORAL | Status: DC | PRN
  Administered 2020-05-02: 10 mg via ORAL
  Filled 2020-05-01: qty 2

## 2020-05-01 MED ORDER — SENNOSIDES-DOCUSATE SODIUM 8.6-50 MG PO TABS
1.0000 | ORAL_TABLET | Freq: Two times a day (BID) | ORAL | Status: DC
  Administered 2020-05-01 – 2020-05-02 (×2): 1 via ORAL
  Filled 2020-05-01 (×2): qty 1

## 2020-05-01 MED ORDER — MONTELUKAST SODIUM 10 MG PO TABS
10.0000 mg | ORAL_TABLET | Freq: Every day | ORAL | Status: DC
  Administered 2020-05-01: 10 mg via ORAL
  Filled 2020-05-01: qty 1

## 2020-05-01 MED ORDER — OXYCODONE HCL 5 MG/5ML PO SOLN: 5.0000 mg | Freq: Once | ORAL | Status: AC | PRN

## 2020-05-01 MED ORDER — CELECOXIB 200 MG PO CAPS
ORAL_CAPSULE | ORAL | Status: AC
  Administered 2020-05-01: 400 mg via ORAL
  Filled 2020-05-01: qty 2

## 2020-05-01 MED ORDER — EPHEDRINE SULFATE 50 MG/ML IJ SOLN
INTRAMUSCULAR | Status: DC | PRN
  Administered 2020-05-01 (×3): 5 mg via INTRAVENOUS
  Administered 2020-05-01 (×3): 10 mg via INTRAVENOUS
  Administered 2020-05-01: 5 mg via INTRAVENOUS

## 2020-05-01 MED ORDER — FINASTERIDE 5 MG PO TABS
5.0000 mg | ORAL_TABLET | Freq: Every day | ORAL | Status: DC
  Administered 2020-05-01: 5 mg via ORAL
  Filled 2020-05-01: qty 1

## 2020-05-01 MED ORDER — SODIUM CHLORIDE 0.9 % IV SOLN
INTRAVENOUS | Status: DC | PRN
  Administered 2020-05-01: 60 mL

## 2020-05-01 MED ORDER — TRAZODONE HCL 50 MG PO TABS
50.0000 mg | ORAL_TABLET | Freq: Every day | ORAL | Status: DC
  Administered 2020-05-01: 50 mg via ORAL
  Filled 2020-05-01: qty 1

## 2020-05-01 MED ORDER — OXYCODONE HCL 5 MG PO TABS
5.0000 mg | ORAL_TABLET | Freq: Once | ORAL | Status: AC | PRN
Start: 2020-05-01 — End: 2020-05-01
  Administered 2020-05-01: 5 mg via ORAL

## 2020-05-01 MED ORDER — ALBUTEROL SULFATE HFA 108 (90 BASE) MCG/ACT IN AERS
2.0000 | INHALATION_SPRAY | RESPIRATORY_TRACT | Status: DC | PRN
  Filled 2020-05-01 (×2): qty 6.7

## 2020-05-01 MED ORDER — DEXAMETHASONE SODIUM PHOSPHATE 10 MG/ML IJ SOLN
INTRAMUSCULAR | Status: AC
  Administered 2020-05-01: 8 mg via INTRAVENOUS
  Filled 2020-05-01: qty 1

## 2020-05-01 MED ORDER — BISACODYL 10 MG RE SUPP: 10.0000 mg | Freq: Every day | RECTAL | Status: DC | PRN

## 2020-05-01 MED ORDER — ADULT MULTIVITAMIN W/MINERALS CH
1.0000 | ORAL_TABLET | Freq: Every day | ORAL | Status: DC
  Administered 2020-05-01 – 2020-05-02 (×2): 1 via ORAL
  Filled 2020-05-01 (×2): qty 1

## 2020-05-01 MED ORDER — ACETAMINOPHEN 325 MG PO TABS: 325.0000 mg | ORAL_TABLET | Freq: Four times a day (QID) | ORAL | Status: DC | PRN

## 2020-05-01 MED ORDER — PANTOPRAZOLE SODIUM 40 MG PO TBEC
40.0000 mg | DELAYED_RELEASE_TABLET | Freq: Two times a day (BID) | ORAL | Status: DC
  Administered 2020-05-01 – 2020-05-02 (×2): 40 mg via ORAL
  Filled 2020-05-01 (×2): qty 1

## 2020-05-01 MED ORDER — ONDANSETRON HCL 4 MG/2ML IJ SOLN: 4.0000 mg | Freq: Four times a day (QID) | INTRAMUSCULAR | Status: DC | PRN

## 2020-05-01 MED ORDER — OXYCODONE HCL 5 MG PO TABS
ORAL_TABLET | ORAL | Status: AC
  Filled 2020-05-01: qty 1

## 2020-05-01 MED ORDER — TIOTROPIUM BROMIDE MONOHYDRATE 18 MCG IN CAPS
18.0000 ug | ORAL_CAPSULE | Freq: Every day | RESPIRATORY_TRACT | Status: DC
  Administered 2020-05-01 – 2020-05-02 (×2): 18 ug via RESPIRATORY_TRACT
  Filled 2020-05-01: qty 5

## 2020-05-01 MED ORDER — BUPIVACAINE HCL (PF) 0.5 % IJ SOLN
INTRAMUSCULAR | Status: DC | PRN
  Administered 2020-05-01: 3 mL

## 2020-05-01 MED ORDER — KETAMINE HCL 10 MG/ML IJ SOLN
INTRAMUSCULAR | Status: DC | PRN
  Administered 2020-05-01 (×2): 20 mg via INTRAVENOUS
  Administered 2020-05-01: 10 mg via INTRAVENOUS

## 2020-05-01 MED ORDER — POLYETHYLENE GLYCOL 3350 17 G PO PACK
17.0000 g | PACK | Freq: Every day | ORAL | Status: DC
  Administered 2020-05-01: 17 g via ORAL
  Filled 2020-05-01: qty 1

## 2020-05-01 SURGICAL SUPPLY — 78 items
ATTUNE MED DOME PAT 38 KNEE (Knees) ×1 IMPLANT
ATTUNE PS FEM RT SZ 7 CEM KNEE (Femur) ×1 IMPLANT
ATTUNE PSRP INSR SZ7 6 KNEE (Insert) ×1 IMPLANT
BASE TIBIAL ROT PLAT SZ 7 KNEE (Knees) IMPLANT
BATTERY INSTRU NAVIGATION (MISCELLANEOUS) ×8 IMPLANT
BLADE SAW 70X12.5 (BLADE) ×2 IMPLANT
BLADE SAW 90X13X1.19 OSCILLAT (BLADE) ×2 IMPLANT
BLADE SAW 90X25X1.19 OSCILLAT (BLADE) ×2 IMPLANT
CANISTER SUCT 3000ML PPV (MISCELLANEOUS) ×2 IMPLANT
CEMENT HV SMART SET (Cement) ×2 IMPLANT
COOLER POLAR GLACIER W/PUMP (MISCELLANEOUS) ×2 IMPLANT
COVER WAND RF STERILE (DRAPES) ×2 IMPLANT
CUFF TOURN SGL QUICK 30 (TOURNIQUET CUFF) ×1
CUFF TRNQT CYL 30X4X21-28X (TOURNIQUET CUFF) IMPLANT
DRAPE 3/4 80X56 (DRAPES) ×2 IMPLANT
DRESSING PEEL AND PLAC PRVNA20 (GAUZE/BANDAGES/DRESSINGS) ×1 IMPLANT
DRSG DERMACEA 8X12 NADH (GAUZE/BANDAGES/DRESSINGS) ×2 IMPLANT
DRSG MEPILEX SACRM 8.7X9.8 (GAUZE/BANDAGES/DRESSINGS) ×2 IMPLANT
DRSG OPSITE POSTOP 4X14 (GAUZE/BANDAGES/DRESSINGS) ×2 IMPLANT
DRSG PEEL AND PLACE PREVENA 20 (GAUZE/BANDAGES/DRESSINGS) ×2
DRSG TEGADERM 4X4.75 (GAUZE/BANDAGES/DRESSINGS) ×2 IMPLANT
DURAPREP 26ML APPLICATOR (WOUND CARE) ×4 IMPLANT
ELECT CAUTERY BLADE 6.4 (BLADE) ×2 IMPLANT
ELECT REM PT RETURN 9FT ADLT (ELECTROSURGICAL) ×2
ELECTRODE REM PT RTRN 9FT ADLT (ELECTROSURGICAL) ×1 IMPLANT
EX-PIN ORTHOLOCK NAV 4X150 (PIN) ×4 IMPLANT
GAUZE SPONGE 4X4 12PLY STRL (GAUZE/BANDAGES/DRESSINGS) ×1 IMPLANT
GLOVE SURG ENC MOIS LTX SZ7.5 (GLOVE) ×4 IMPLANT
GLOVE SURG ENC TEXT LTX SZ7.5 (GLOVE) ×4 IMPLANT
GLOVE SURG UNDER LTX SZ8 (GLOVE) ×2 IMPLANT
GLOVE SURG UNDER POLY LF SZ7.5 (GLOVE) ×2 IMPLANT
GOWN STRL REUS W/ TWL LRG LVL3 (GOWN DISPOSABLE) ×2 IMPLANT
GOWN STRL REUS W/ TWL XL LVL3 (GOWN DISPOSABLE) ×1 IMPLANT
GOWN STRL REUS W/TWL LRG LVL3 (GOWN DISPOSABLE) ×2
GOWN STRL REUS W/TWL XL LVL3 (GOWN DISPOSABLE) ×1
HEMOVAC 400CC 10FR (MISCELLANEOUS) ×2 IMPLANT
HOLDER FOLEY CATH W/STRAP (MISCELLANEOUS) ×2 IMPLANT
HOOD PEEL AWAY FLYTE STAYCOOL (MISCELLANEOUS) ×4 IMPLANT
IRRIGATION SURGIPHOR STRL (IV SOLUTION) ×2 IMPLANT
IV NS IRRIG 3000ML ARTHROMATIC (IV SOLUTION) ×2 IMPLANT
KIT TURNOVER KIT A (KITS) ×2 IMPLANT
KNIFE SCULPS 14X20 (INSTRUMENTS) ×2 IMPLANT
LABEL OR SOLS (LABEL) ×2 IMPLANT
MANIFOLD NEPTUNE II (INSTRUMENTS) ×4 IMPLANT
NDL SAFETY ECLIPSE 18X1.5 (NEEDLE) ×1 IMPLANT
NDL SPNL 20GX3.5 QUINCKE YW (NEEDLE) ×2 IMPLANT
NEEDLE HYPO 18GX1.5 SHARP (NEEDLE) ×1
NEEDLE SPNL 20GX3.5 QUINCKE YW (NEEDLE) ×4 IMPLANT
NS IRRIG 500ML POUR BTL (IV SOLUTION) ×2 IMPLANT
PACK TOTAL KNEE (MISCELLANEOUS) ×2 IMPLANT
PAD ABD DERMACEA PRESS 5X9 (GAUZE/BANDAGES/DRESSINGS) ×1 IMPLANT
PAD CAST CTTN 4X4 STRL (SOFTGOODS) IMPLANT
PAD WRAPON POLAR KNEE (MISCELLANEOUS) ×1 IMPLANT
PADDING CAST COTTON 4X4 STRL (SOFTGOODS) ×1
PENCIL SMOKE EVACUATOR COATED (MISCELLANEOUS) ×2 IMPLANT
PIN DRILL QUICK PACK ×2 IMPLANT
PIN FIXATION 1/8DIA X 3INL (PIN) ×6 IMPLANT
PULSAVAC PLUS IRRIG FAN TIP (DISPOSABLE) ×2
SOL PREP PVP 2OZ (MISCELLANEOUS) ×2
SOLUTION PREP PVP 2OZ (MISCELLANEOUS) ×1 IMPLANT
SPONGE DRAIN TRACH 4X4 STRL 2S (GAUZE/BANDAGES/DRESSINGS) ×2 IMPLANT
STAPLER SKIN PROX 35W (STAPLE) ×2 IMPLANT
STOCKINETTE BIAS CUT 6 980064 (GAUZE/BANDAGES/DRESSINGS) ×1 IMPLANT
STOCKINETTE IMPERV 14X48 (MISCELLANEOUS) ×1 IMPLANT
STRAP TIBIA SHORT (MISCELLANEOUS) ×2 IMPLANT
SUCTION FRAZIER HANDLE 10FR (MISCELLANEOUS) ×1
SUCTION TUBE FRAZIER 10FR DISP (MISCELLANEOUS) ×1 IMPLANT
SUT VIC AB 0 CT1 36 (SUTURE) ×4 IMPLANT
SUT VIC AB 1 CT1 36 (SUTURE) ×4 IMPLANT
SUT VIC AB 2-0 CT2 27 (SUTURE) ×2 IMPLANT
SYR 20ML LL LF (SYRINGE) ×2 IMPLANT
SYR 30ML LL (SYRINGE) ×4 IMPLANT
TIBIAL BASE ROT PLAT SZ 7 KNEE (Knees) ×2 IMPLANT
TIP FAN IRRIG PULSAVAC PLUS (DISPOSABLE) ×1 IMPLANT
TOWEL OR 17X26 4PK STRL BLUE (TOWEL DISPOSABLE) ×2 IMPLANT
TOWER CARTRIDGE SMART MIX (DISPOSABLE) ×2 IMPLANT
TRAY FOLEY MTR SLVR 16FR STAT (SET/KITS/TRAYS/PACK) ×2 IMPLANT
WRAPON POLAR PAD KNEE (MISCELLANEOUS) ×2

## 2020-05-01 NOTE — Transfer of Care (Signed)
Immediate Anesthesia Transfer of Care Note  Patient: Kyle Wells  Procedure(s) Performed: COMPUTER ASSISTED TOTAL KNEE ARTHROPLASTY (Right Knee)  Patient Location: PACU  Anesthesia Type:General  Level of Consciousness: drowsy  Airway & Oxygen Therapy: Patient Spontanous Breathing and Patient connected to face mask oxygen  Post-op Assessment: Report given to RN and Post -op Vital signs reviewed and stable  Post vital signs: Reviewed and stable  Last Vitals:  Vitals Value Taken Time  BP 97/65 05/01/20 1545  Temp    Pulse 103 05/01/20 1547  Resp 17 05/01/20 1547  SpO2 95 % 05/01/20 1547  Vitals shown include unvalidated device data.  Last Pain:  Vitals:   05/01/20 1004  PainSc: 0-No pain         Complications: No complications documented.

## 2020-05-01 NOTE — Anesthesia Preprocedure Evaluation (Addendum)
Anesthesia Evaluation  Patient identified by MRN, date of birth, ID band Patient awake    Reviewed: Allergy & Precautions, H&P , NPO status , Patient's Chart, lab work & pertinent test results  History of Anesthesia Complications Negative for: history of anesthetic complications  Airway Mallampati: III  TM Distance: >3 FB Neck ROM: limited    Dental  (+) Chipped, Poor Dentition, Missing   Pulmonary asthma , sleep apnea, Continuous Positive Airway Pressure Ventilation and Oxygen sleep apnea , COPD,  oxygen dependent, Current Smoker and Patient abstained from smoking.,    Pulmonary exam normal        Cardiovascular Exercise Tolerance: Good (-) anginaNormal cardiovascular exam     Neuro/Psych negative neurological ROS  negative psych ROS   GI/Hepatic negative GI ROS, Neg liver ROS, neg GERD  ,  Endo/Other  negative endocrine ROS  Renal/GU      Musculoskeletal  (+) Arthritis ,   Abdominal   Peds  Hematology negative hematology ROS (+)   Anesthesia Other Findings Past Medical History: No date: Arthritis No date: Asthma No date: BPH (benign prostatic hyperplasia) No date: COPD (chronic obstructive pulmonary disease) (HCC) No date: Hypercholesterolemia No date: Impingement syndrome of right shoulder No date: OSA (obstructive sleep apnea) No date: Small bowel obstruction (HCC)  Past Surgical History: No date: COLONOSCOPY 12/20/2019: ESOPHAGOGASTRODUODENOSCOPY (EGD) WITH PROPOFOL; N/A     Comment:  Procedure: ESOPHAGOGASTRODUODENOSCOPY (EGD) WITH               PROPOFOL;  Surgeon: Midge Minium, MD;  Location: [facility]               ENDOSCOPY;  Service: Endoscopy;  Laterality: N/A; 11/26/2019: IVC FILTER INSERTION; N/A     Comment:  Procedure: IVC FILTER INSERTION;  Surgeon: Renford Dills, MD;  Location: [facility] INVASIVE CV LAB;  Service:              Cardiovascular;  Laterality: N/A; No date: JOINT  REPLACEMENT 12/08/2019: KNEE ARTHROPLASTY; Left     Comment:  Procedure: COMPUTER ASSISTED TOTAL KNEE ARTHROPLASTY;                Surgeon: Donato Heinz, MD;  Location: [facility] ORS;                Service: Orthopedics;  Laterality: Left; No date: PROSTATE BIOPSY  BMI    Body Mass Index: 30.71 kg/m      Reproductive/Obstetrics negative OB ROS                             Anesthesia Physical Anesthesia Plan  ASA: III  Anesthesia Plan: Spinal   Post-op Pain Management:    Induction:   PONV Risk Score and Plan:   Airway Management Planned: Natural Airway and Nasal Cannula  Additional Equipment:   Intra-op Plan:   Post-operative Plan:   Informed Consent: I have reviewed the patients History and Physical, chart, labs and discussed the procedure including the risks, benefits and alternatives for the proposed anesthesia with the patient or authorized representative who has indicated his/her understanding and acceptance.     Dental Advisory Given  Plan Discussed with: Anesthesiologist, CRNA and Surgeon  Anesthesia Plan Comments: (Patient reports no bleeding problems and no anticoagulant use (patient reports no Xarelto use in over 72 hours) .  Plan for spinal with backup GA  Patient  consented for risks of anesthesia including but not limited to:  - adverse reactions to medications - damage to eyes, teeth, lips or other oral mucosa - nerve damage due to positioning  - risk of bleeding, infection and or nerve damage from spinal that could lead to paralysis - risk of headache or failed spinal - damage to teeth, lips or other oral mucosa - sore throat or hoarseness - damage to heart, brain, nerves, lungs, other parts of body or loss of life  Patient voiced understanding.)      Anesthesia Quick Evaluation

## 2020-05-01 NOTE — Anesthesia Procedure Notes (Signed)
Spinal  Patient location during procedure: OR Start time: 05/01/2020 11:35 AM End time: 05/01/2020 11:38 AM Reason for block: surgical anesthesia Staffing Performed: resident/CRNA  Anesthesiologist: Piscitello, Precious Haws, MD Resident/CRNA: Hedda Slade, CRNA Preanesthetic Checklist Completed: patient identified, IV checked, site marked, risks and benefits discussed, surgical consent, monitors and equipment checked, pre-op evaluation and timeout performed Spinal Block Patient position: sitting Prep: ChloraPrep Patient monitoring: heart rate, continuous pulse ox, blood pressure and cardiac monitor Approach: midline Location: L3-4 Injection technique: single-shot Needle Needle type: Whitacre and Introducer  Needle gauge: 24 G Needle length: 9 cm Assessment Events: CSF return Additional Notes Negative paresthesia. Negative blood return. Positive free-flowing CSF. Expiration date of kit checked and confirmed. Patient tolerated procedure well, without complications.

## 2020-05-01 NOTE — Op Note (Signed)
OPERATIVE NOTE  DATE OF SURGERY:  05/01/2020  PATIENT NAME:  Kyle Wells   DOB: [birth year]  MRN: [id number]  PRE-OPERATIVE DIAGNOSIS: Degenerative arthrosis of the right knee, primary  POST-OPERATIVE DIAGNOSIS:  Same  PROCEDURE:  Right total knee arthroplasty using computer-assisted navigation  SURGEON:  Jena Gauss. M.D.  ASSISTANT: Baldwin Jamaica, PA-C (present and scrubbed throughout the case, critical for assistance with exposure, retraction, instrumentation, and closure)  ANESTHESIA: spinal  ESTIMATED BLOOD LOSS: 50 mL  FLUIDS REPLACED: 1100 mL of crystalloid  TOURNIQUET TIME: 109 minutes  DRAINS: 2 medium Hemovac  SOFT TISSUE RELEASES: Anterior cruciate ligament, posterior cruciate ligament, deep and superficial medial collateral ligament, patellofemoral ligament  IMPLANTS UTILIZED: DePuy Attune size 7 posterior stabilized femoral component (cemented), size 7 rotating platform tibial component (cemented), 38 mm medialized dome patella (cemented), and a 6 mm stabilized rotating platform polyethylene insert.  INDICATIONS FOR SURGERY: Kyle Wells is a 87 y.o. year old male with a long history of progressive knee pain. X-rays demonstrated severe degenerative changes in tricompartmental fashion. The patient had not seen any significant improvement despite conservative nonsurgical intervention. After discussion of the risks and benefits of surgical intervention, the patient expressed understanding of the risks benefits and agree with plans for total knee arthroplasty.   The risks, benefits, and alternatives were discussed at length including but not limited to the risks of infection, bleeding, nerve injury, stiffness, blood clots, the need for revision surgery, cardiopulmonary complications, among others, and they were willing to proceed.  PROCEDURE IN DETAIL: The patient was brought into the operating room and, after adequate spinal anesthesia was achieved, a tourniquet  was placed on the patient's upper thigh. The patient's knee and leg were cleaned and prepped with alcohol and DuraPrep and draped in the usual sterile fashion. A "timeout" was performed as per usual protocol. The lower extremity was exsanguinated using an Esmarch, and the tourniquet was inflated to 300 mmHg. An anterior longitudinal incision was made followed by a standard mid vastus approach. The deep fibers of the medial collateral ligament were elevated in a subperiosteal fashion off of the medial flare of the tibia so as to maintain a continuous soft tissue sleeve. The patella was subluxed laterally and the patellofemoral ligament was incised. Inspection of the knee demonstrated severe degenerative changes with full-thickness loss of articular cartilage. Osteophytes were debrided using a rongeur. Anterior and posterior cruciate ligaments were excised. Two 4.0 mm Schanz pins were inserted in the femur and into the tibia for attachment of the array of trackers used for computer-assisted navigation. Hip center was identified using a circumduction technique. Distal landmarks were mapped using the computer. The distal femur and proximal tibia were mapped using the computer. The distal femoral cutting guide was positioned using computer-assisted navigation so as to achieve a 5 distal valgus cut. The femur was sized and it was felt that a size 7 femoral component was appropriate. A size 7 femoral cutting guide was positioned and the anterior cut was performed and verified using the computer. This was followed by completion of the posterior and chamfer cuts. Femoral cutting guide for the central box was then positioned in the center box cut was performed.  Attention was then directed to the proximal tibia. Medial and lateral menisci were excised. The extramedullary tibial cutting guide was positioned using computer-assisted navigation so as to achieve a 0 varus-valgus alignment and 3 posterior slope. The cut was  performed and verified using the computer. The proximal tibia  was sized and it was felt that a size 7 tibial tray was appropriate. Tibial and femoral trials were inserted followed by insertion of a 5 mm polyethylene insert. The knee was felt to be tight medially. A Cobb elevator was used to elevate the superficial fibers of the medial collateral ligament.  A 6 mm polyethylene trial was used to replace the 5 mm polyethylene trial.  This allowed for excellent mediolateral soft tissue balancing both in flexion and in full extension. Finally, the patella was cut and prepared so as to accommodate a 38 mm medialized dome patella. A patella trial was placed and the knee was placed through a range of motion with excellent patellar tracking appreciated. The femoral trial was removed after debridement of posterior osteophytes. The central post-hole for the tibial component was reamed followed by insertion of a keel punch. Tibial trials were then removed. Cut surfaces of bone were irrigated with copious amounts of normal saline using pulsatile lavage and then suctioned dry. Polymethylmethacrylate cement was prepared in the usual fashion using a vacuum mixer. Cement was applied to the cut surface of the proximal tibia as well as along the undersurface of a size 7 rotating platform tibial component. Tibial component was positioned and impacted into place. Excess cement was removed using Personal assistant. Cement was then applied to the cut surfaces of the femur as well as along the posterior flanges of the size 7 femoral component. The femoral component was positioned and impacted into place. Excess cement was removed using Personal assistant. A 6 mm polyethylene trial was inserted and the knee was brought into full extension with steady axial compression applied. Finally, cement was applied to the backside of a 38 mm medialized dome patella and the patellar component was positioned and patellar clamp applied. Excess cement was  removed using Personal assistant. After adequate curing of the cement, the tourniquet was deflated after a total tourniquet time of 109 minutes. Hemostasis was achieved using electrocautery. The knee was irrigated with copious amounts of normal saline using pulsatile lavage followed by 500 ml of Surgiphor and then suctioned dry. 20 mL of 1.3% Exparel and 60 mL of 0.25% Marcaine in 40 mL of normal saline was injected along the posterior capsule, medial and lateral gutters, and along the arthrotomy site. A 6 mm stabilized rotating platform polyethylene insert was inserted and the knee was placed through a range of motion with excellent mediolateral soft tissue balancing appreciated and excellent patellar tracking noted. 2 medium drains were placed in the wound bed and brought out through separate stab incisions. The medial parapatellar portion of the incision was reapproximated using interrupted sutures of #1 Vicryl. Subcutaneous tissue was approximated in layers using first #0 Vicryl followed #2-0 Vicryl. The skin was approximated with skin staples. A sterile dressing was applied.  The patient tolerated the procedure well and was transported to the recovery room in stable condition.    Breland P. Angie Fava., M.D.

## 2020-05-01 NOTE — H&P (Signed)
ORTHOPAEDIC HISTORY & PHYSICAL Kyle Wells, [name] - 04/21/2020 2:45 PM EDT Formatting of this note is different from the original. [facility] Chief Complaint:   Chief Complaint  Patient presents with  . Knee Pain  H & P RIGHT KNEE   History of Present Illness:   Kyle Wells is a 87 y.o. male that presents to clinic today for his preoperative history and evaluation. Patient presents unaccompanied. The patient is scheduled to undergo a right total knee arthroplasty on 05/01/20 by Dr. Ernest Pine. His pain began many years ago. The pain is located primarily along the medial aspect of the knee. He describes his pain as worse with weightbearing. He reports associated swelling with some giving way of the knee. He denies associated numbness or tingling, denies locking.   The patient's symptoms have progressed to the point that they decrease his quality of life. The patient has previously undergone conservative treatment including NSAIDS and injections to the knee without adequate control of his symptoms.  Patient does have a history of pulmonary embolism. He is currently taking Xarelto for anticoagulation. He has had an IVC filter placed by Dr. Dion Saucier. Denies lumbar surgery. Patient previously underwent left total knee arthroplasty on 12/08/2019.  Past Medical, Surgical, Family, Social History, Allergies, Medications:   Past Medical History:  Past Medical History:  Diagnosis Date  . Benign localized hyperplasia of prostate with urinary obstruction and other lower urinary tract symptoms (LUTS)(600.21)  . Benign localized prostatic hyperplasia with lower urinary tract symptoms (LUTS) 10/16/2011  . Benign neoplasm of colon  . Chronic airway obstruction (CMS-HCC) 10/20/2010  . Chronic obstructive airway disease with asthma (CMS-HCC) 05/31/2011  . Elevated prostate specific antigen (PSA) 10/16/2011  . Elevated PSA  . Emphysema, unspecified (CMS-HCC)   . Frequency of micturition 10/16/2011  . Hemorrhage of rectum and anus  . Nausea 12/26/2016  . Obesity  . Other allergy, other than to medicinal agents 10/20/2010  . Retention of urine 10/16/2011  . Sleep apnea  on CPAP  . Small bowel obstruction (CMS-HCC) 09/30/2014  . Tobacco abuse   Past Surgical History:  Past Surgical History:  Procedure Laterality Date  . Left total knee arthroplasty using computer-assisted navigation 12/08/2019  Dr Ernest Pine  . Biopsy of prostate  normal, Dr. Evelene Croon  . COLONOSCOPY  . EGD   Current Medications:  Current Outpatient Medications  Medication Sig Dispense Refill  . acetaminophen (TYLENOL) 500 MG tablet Take 1,000 mg by mouth every 8 (eight) hours as needed  . albuterol (PROVENTIL) 2.5 mg /3 mL (0.083 %) nebulizer solution TAKE 3 MLS (2.5 MG TOTAL) BY NEBULIZATION 4 (FOUR) TIMES DAILY 1080 mL 1  . albuterol 90 mcg/actuation inhaler INHALE 2 INHALATIONS INTO THE LUNGS EVERY 4 (FOUR) HOURS AS NEEDED FOR WHEEZING 18 g 1  . azelastine (ASTELIN) 137 mcg nasal spray Place 1 spray into both nostrils 2 (two) times daily as needed  . docusate (COLACE) 100 MG capsule Take 100 mg by mouth 2 (two) times daily as needed  . finasteride (PROSCAR) 5 mg tablet TAKE 1 TABLET BY MOUTH EVERY DAY 90 tablet 1  . fluticasone propion-salmeteroL (ADVAIR DISKUS) 500-50 mcg/dose diskus inhaler Inhale 1 inhalation into the lungs every 12 (twelve) hours 3 Inhaler 3  . montelukast (SINGULAIR) 10 mg tablet TAKE 1 TABLET BY MOUTH EVERY DAY 90 tablet 3  . multivitamin-minerals-lutein Tab 1 tab by mouth daily  . polyethylene glycol (MIRALAX) packet Take 17 g by mouth once  daily as needed  . SPIRIVA WITH HANDIHALER 18 mcg inhalation capsule PLACE 1 CAPSULE (18 MCG TOTAL) INTO INHALER AND INHALE ONCE DAILY 90 capsule 3  . traZODone (DESYREL) 50 MG tablet Take 1.5 tablets (75 mg total) by mouth nightly as needed for Sleep 135 tablet 3  . XARELTO 20 mg tablet TAKE 1 TABLET BY MOUTH DAILY  WITH BREAKFAST 90 tablet 1   No current facility-administered medications for this visit.   Allergies:  Allergies  Allergen Reactions  . Zileuton Hives  . Roflumilast Other (See Comments)  Insomnia    Social History:  Social History   Socioeconomic History  . Marital status: Married  Spouse name: [name]  . Number of children: 2  . Years of education: 4  . Highest education level: Not on file  Occupational History  . Occupation: Retired-Electrician  Tobacco Use  . Smoking status: Current Some Day Smoker  Packs/day: 0.10  Years: 44.00  Pack years: 4.40  Types: Cigarettes  Last attempt to quit: 02/05/2018  Years since quitting: 2.2  . Smokeless tobacco: Never Used  Vaping Use  . Vaping Use: Never used  Substance and Sexual Activity  . Alcohol use: Yes  Alcohol/week: 0.0 standard drinks  Comment: vodka sometimes  . Drug use: No  . Sexual activity: Defer  Partners: Female  Other Topics Concern  . Not on file  Social History Narrative  . Not on file   Social Determinants of Health   Financial Resource Strain: Not on file  Food Insecurity: Not on file  Transportation Needs: Not on file  Physical Activity: Not on file  Stress: Not on file  Social Connections: Not on file  Housing Stability: Not on file   Family History:  Family History  Problem Relation Age of Onset  . Asthma Father  . Myocardial Infarction (Heart attack) Father   Review of Systems:   A 10+ ROS was performed, reviewed, and the pertinent orthopaedic findings are documented in the HPI.   Physical Examination:   BP 120/80 (BP Location: Left upper arm, Patient Position: Sitting, BP Cuff Size: Adult)  Ht 167.6 cm (5\' 6" )  Wt 91.1 kg (200 lb 12.8 oz)  BMI 32.41 kg/m   Patient is a well-developed, well-nourished male in no acute distress. Patient has normal mood and affect. Patient is alert and oriented to person, place, and time.   HEENT: Atraumatic, normocephalic. Pupils equal and  reactive to light. Extraocular motion intact. Noninjected sclera.  Cardiovascular: Regular rate and rhythm, with no murmurs, rubs, or gallops. Distal pulses palpable. No bruits.  Respiratory: Lungs clear to auscultation bilaterally.   Right Knee: Soft tissue swelling: minimal Effusion: none Erythema: none Crepitance: moderate Tenderness: medial Alignment: relative varus Mediolateral laxity: medial pseudolaxity Posterior sag: negative Patellar tracking: Good tracking without evidence of subluxation or tilt Atrophy: Generalized quadriceps atrophy.  Quadriceps tone was fair to good. Range of motion: 0/15/115 degrees  Sensation intact over the saphenous, lateral sural cutaneous, superficial fibular, and deep fibular nerve distributions.  Tests Performed/Reviewed:  X-rays  Anteroposterior, lateral, and sunrise views of the right knee were obtained. Images reveal complete loss of medial compartment joint space with bone-on-bone contact, osteophyte formation, and subchondral sclerosis noted. Varus alignment of the knee noted. Loss of joint space noted laterally with osteophyte formation. Loss of patellofemoral joint space noted. No fractures or dislocations.  I personally ordered and interpreted today's x-rays.  Impression:   ICD-10-CM  1. Primary osteoarthritis of right knee M17.11   Plan:  The patient has end-stage degenerative changes of the right knee. It was explained to the patient that the condition is progressive in nature. Having failed conservative treatment, the patient has elected to proceed with a total joint arthroplasty. The patient will undergo a total joint arthroplasty with Dr. Ernest Pine. The risks of surgery, including blood clot and infection, were discussed with the patient. Measures to reduce these risks, including the use of anticoagulation, perioperative antibiotics, and early ambulation were discussed. The importance of postoperative physical therapy was discussed  with the patient. The patient elects to proceed with surgery. The patient is instructed to stop all blood thinners prior to surgery. The patient is instructed to call the hospital the day before surgery to learn of the proper arrival time.   Contact our office with any questions or concerns. Follow up as indicated, or sooner should any new problems arise, if conditions worsen, or if they are otherwise concerned.   Kyle Gardener, PA-C [facility] [address] [place], [place] [address] Phone: [phone]  This note was generated in part with voice recognition software and I apologize for any typographical errors that were not detected and corrected.   Electronically signed by Kyle Gardener, PA at 04/21/2020 5:59 PM EDT

## 2020-05-01 NOTE — H&P (Signed)
The patient has been re-examined, and the chart reviewed, and there have been no interval changes to the documented history and physical.    The risks, benefits, and alternatives have been discussed at length. The patient expressed understanding of the risks benefits and agreed with plans for surgical intervention.  Emrick P. Taniyah Ballow, Jr. M.D.    

## 2020-05-02 ENCOUNTER — Encounter: Payer: Self-pay | Admitting: Orthopedic Surgery

## 2020-05-02 DIAGNOSIS — M1711 Unilateral primary osteoarthritis, right knee: Secondary | ICD-10-CM | POA: Diagnosis not present

## 2020-05-02 MED ORDER — OXYCODONE HCL 5 MG PO TABS: 5.0000 mg | ORAL_TABLET | ORAL | 0 refills | Status: DC | PRN

## 2020-05-02 NOTE — Progress Notes (Signed)
Discharge Note: Reviewed discharge instructions with pt. Daughter at bedside. Both pt and daughter verbalized understanding. Medical Compression socks on.  PT discharged with 1 extra honeycomb dressing, Zero Bone foam, Polar care, and Incentive spirometer. Iv cath intact upon removal. Staff wheeled pt out. Pt transported to home via private vehicle.

## 2020-05-02 NOTE — TOC Benefit Eligibility Note (Signed)
Transition of Care [facility]) Benefit Eligibility Note    Patient Details  Name: Kyle Wells MRN: [id number] Date of Birth: [birth year]   Medication/Dose: ENOXAPARIN 40 MG BID SYRINGES  Covered?: Yes  Tier:  (TIER- 1 DRUG)  Prescription Coverage Preferred Pharmacy: [facility], [address]  Spoke with Person/Company/Phone Number:: DONNA @  [employer] # [phone]  Co-Pay: $10.00  Prior Approval: No  Deductible:  (NO DEDUCTIBLE WITH PLAN)  Additional Notes: LOVENOX : NON-FORMULARY -NOT- COVER: P/A-YES # [phone]    Mardene Sayer Phone Number: 05/02/2020, 11:29 AM

## 2020-05-02 NOTE — Progress Notes (Signed)
  Subjective: 1 Day Post-Op Procedure(s) (LRB): COMPUTER ASSISTED TOTAL KNEE ARTHROPLASTY (Right) Patient reports pain as well-controlled.   Patient is well, and has had no acute complaints or problems Plan is to go Home after hospital stay. Negative for chest pain and shortness of breath Fever: no Gastrointestinal: negative for nausea and vomiting.   Patient has not had a bowel movement.  Objective: Vital signs in last 24 hours: Temp:  [97.4 F (36.3 C)-98.9 F (37.2 C)] 97.5 F (36.4 C) (04/05 0307) Pulse Rate:  [67-102] 67 (04/05 0307) Resp:  [12-24] 17 (04/05 0307) BP: (97-134)/(58-97) 103/62 (04/05 0307) SpO2:  [90 %-98 %] 96 % (04/05 0307) Weight:  [91.6 kg] 91.6 kg (04/04 1712)  Intake/Output from previous day:  Intake/Output Summary (Last 24 hours) at 05/02/2020 0838 Last data filed at 05/01/2020 1532 Gross per 24 hour  Intake 1500 ml  Output 1300 ml  Net 200 ml    Intake/Output this shift: No intake/output data recorded.  Labs: No results for input(s): HGB in the last 72 hours. No results for input(s): WBC, RBC, HCT, PLT in the last 72 hours. No results for input(s): NA, K, CL, CO2, BUN, CREATININE, GLUCOSE, CALCIUM in the last 72 hours. No results for input(s): LABPT, INR in the last 72 hours.   EXAM General - Patient is Alert, Appropriate and Oriented Extremity - Neurovascular intact Dorsiflexion/Plantar flexion intact Compartment soft Dressing/Incision -Postoperative dressing remains in place., Polar Care in place and working. , Hemovac in place.  Motor Function - intact, moving foot and toes well on exam.  Cardiovascular- Regular rate and rhythm, no murmurs/rubs/gallops Respiratory- Lungs clear to auscultation bilaterally Gastrointestinal- soft, nontender and active bowel sounds   Assessment/Plan: 1 Day Post-Op Procedure(s) (LRB): COMPUTER ASSISTED TOTAL KNEE ARTHROPLASTY (Right) Active Problems:   Total knee replacement status  Estimated body mass  index is 30.71 kg/m as calculated from the following:   Height as of this encounter: 5\' 8"  (1.727 m).   Weight as of this encounter: 91.6 kg. Advance diet Up with therapy  Discussed possible d/c later today.  DVT Prophylaxis - Eloquis, Ted hose and foot pumps Weight-Bearing as tolerated to right leg  , PA-C [facility] 05/02/2020, 8:38 AM

## 2020-05-02 NOTE — Plan of Care (Signed)
  Problem: Health Behavior/Discharge Planning: Goal: Ability to manage health-related needs will improve Outcome: Progressing   

## 2020-05-02 NOTE — Evaluation (Signed)
Occupational Therapy Evaluation Patient Details Name: Kyle Wells MRN: [id number] DOB: Aug 07, 1933 Today's Date: 05/02/2020    History of Present Illness 87 y/o male s/o R TKA 4/4, had L knee replaced 11/2019 with good results.   Clinical Impression   Kyle Wells was seen for OT evaluation this date, POD#1 from above surgery. Pt was independent in all ADLs prior to surgery, using primarily a SPC in home and community, with occasional use of a RW. Pt underwent L TKA in Nov 2021, with good results. Pt lives alone; however, his daughter will be staying with him as he recovers from surgery. Pt is eager to return to PLOF with less pain and improved mobility. He plans to return to boating/fishing within 6 weeks. Pt currently requires MOD A for LB dressing while in seated position due to limited AROM of R knee. Pt and daughter instructed in polar care mgt, falls prevention strategies, home/routines modifications, DME/AE for LB bathing and dressing tasks, and compression stocking mgt. They verbalized good understanding and were able to provide teach-back/demonstration. Do not currently anticipate any OT needs following this hospitalization.      Follow Up Recommendations  No OT follow up    Equipment Recommendations  None recommended by OT    Recommendations for Other Services       Precautions / Restrictions Precautions Precautions: Fall Restrictions Weight Bearing Restrictions: Yes RLE Weight Bearing: Weight bearing as tolerated      Mobility Bed Mobility               General bed mobility comments: pt received in recliner    Transfers Overall transfer level: Modified independent Equipment used: Rolling walker (2 wheeled)             General transfer comment: Pt able to rise to standing w/o assist or cueing, using RW. Good safety and confidence    Balance Overall balance assessment: Modified Independent                                          ADL either performed or assessed with clinical judgement   ADL Overall ADL's : Needs assistance/impaired     Grooming: Modified independent;Set up               Lower Body Dressing: Moderate assistance                       Vision Baseline Vision/History: Wears glasses Wears Glasses: At all times Patient Visual Report: No change from baseline       Perception     Praxis      Pertinent Vitals/Pain Pain Assessment: 0-10 Pain Score: 2  Pain Location: R knee Pain Intervention(s): Limited activity within patient's tolerance;Premedicated before session;Repositioned;Ice applied     Hand Dominance     Extremity/Trunk Assessment Upper Extremity Assessment Upper Extremity Assessment: Overall WFL for tasks assessed   Lower Extremity Assessment Lower Extremity Assessment: Overall WFL for tasks assessed;RLE deficits/detail RLE Deficits / Details: decreased ROM, s/p R TKA RLE Sensation: WNL       Communication Communication Communication: No difficulties   Cognition Arousal/Alertness: Awake/alert Behavior During Therapy: WFL for tasks assessed/performed Overall Cognitive Status: Within Functional Limits for tasks assessed  General Comments       Exercises Other Exercises: Pt and daughter educ re: polar care mgmt, TED hose mgmt, AE for LB dressing and bathing   Shoulder Instructions      Home Living Family/patient expects to be discharged to:: Private residence Living Arrangements: Children Available Help at Discharge: Family;Available 24 hours/day Type of Home: House Home Access: Level entry     Home Layout: One level     Bathroom Shower/Tub: Producer, television/film/video: Handicapped height Bathroom Accessibility: Yes   Home Equipment: Environmental consultant - 2 wheels;Cane - single point;Shower seat;Electric scooter;Bedside commode          Prior Functioning/Environment Level of Independence:  Independent with assistive device(s)        Comments: Patient reports using rolling walker when he first gets up, then transitions to [facility] "after my first cup of coffee." Pt uses SPC in the community. Patient completes ADLs independently and drives.        OT Problem List: Decreased strength;Decreased range of motion;Decreased activity tolerance;Impaired balance (sitting and/or standing)      OT Treatment/Interventions:      OT Goals(Current goals can be found in the care plan section) Acute Rehab OT Goals Patient Stated Goal: to be on his boat fishing 6 weeks from now OT Goal Formulation: With patient Time For Goal Achievement: 05/16/20 Potential to Achieve Goals: Good  OT Frequency:     Barriers to D/C:            Co-evaluation              AM-PAC OT "6 Clicks" Daily Activity     Outcome Measure Help from another person eating meals?: None Help from another person taking care of personal grooming?: None Help from another person toileting, which includes using toliet, bedpan, or urinal?: A Little Help from another person bathing (including washing, rinsing, drying)?: A Little Help from another person to put on and taking off regular upper body clothing?: None Help from another person to put on and taking off regular lower body clothing?: A Lot 6 Click Score: 20   End of Session Equipment Utilized During Treatment: Rolling walker  Activity Tolerance: Patient tolerated treatment well Patient left: in chair;with family/visitor present;with call bell/phone within reach  OT Visit Diagnosis: Muscle weakness (generalized) (M62.81);Other abnormalities of gait and mobility (R26.89)                Time: 0932-3557 OT Time Calculation (min): 24 min Charges:  OT General Charges $OT Visit: 1 Visit OT Evaluation $OT Eval Low Complexity: 1 Low OT Treatments $Self Care/Home Management : 23-37 mins  Latina Craver, PhD, MS, OTR/L 05/02/20, 11:22 AM

## 2020-05-02 NOTE — Evaluation (Signed)
Physical Therapy Evaluation Patient Details Name: Kyle Wells MRN: [id number] DOB: [birth year] Today's Date: 05/02/2020   History of Present Illness  87 y/o male s/o R TKA 4/4, had L knee replaced 11/21 with good results.  Clinical Impression  Pt did very well with first PT session post-op. He was able to move R LE well and showed good quad control and ROM (AROM flexion >100).  He was able to easily and confidently ambulate >100 ft with walker and relatively consistent cadence, pt not having much pain even during activity/WBing.  Pt did well with L TKA ~5 months ago and by all accounts is continuing this trend now post-op on the R.    Follow Up Recommendations Home health PT;Follow surgeon's recommendation for DC plan and follow-up therapies    Equipment Recommendations  None recommended by PT    Recommendations for Other Services       Precautions / Restrictions Precautions Precautions: Fall Restrictions Weight Bearing Restrictions: Yes RLE Weight Bearing: Weight bearing as tolerated      Mobility  Bed Mobility               General bed mobility comments: in recliner on arrival, reports he felt good getting up to chair with nursing    Transfers Overall transfer level: Modified independent Equipment used: Rolling walker (2 wheeled)             General transfer comment: Pt able to rise to standing w/o assist or extra cuing, good safety and confidence  Ambulation/Gait Ambulation/Gait assistance: Supervision Gait Distance (Feet): 120 Feet Assistive device: Rolling walker (2 wheeled)       General Gait Details: Pt did well with first post-op bout of ambulation; he was able to assume consistent walker motion, did not struggle with fatigue, etc (vitals stable) and generally speaking showed good safety, cadence and confidence  Stairs            Wheelchair Mobility    Modified Rankin (Stroke Patients Only)       Balance Overall balance assessment:  Modified Independent                                           Pertinent Vitals/Pain Pain Assessment: 0-10 Pain Score: 2     Home Living Family/patient expects to be discharged to:: Private residence Living Arrangements: Children Available Help at Discharge: Family;Available 24 hours/day (daughter will be staying with him for post-op recovery) Type of Home: House Home Access: Level entry     Home Layout: One level Home Equipment: Walker - 2 wheels;Cane - single point;Shower seat;Electric scooter;Bedside commode      Prior Function           Comments: Patient reports using rolling walker inside the home and cane in the community. Patient completes ADLs independently and drives      Hand Dominance        Extremity/Trunk Assessment   Upper Extremity Assessment Upper Extremity Assessment: Overall WFL for tasks assessed    Lower Extremity Assessment Lower Extremity Assessment: Overall WFL for tasks assessed (pt with good R LE ROM and strength from recent TKA, L knee with solid QS and ability to lift leg against gravity, good ROM and quality of motion)       Communication   Communication: No difficulties  Cognition Arousal/Alertness: Awake/alert Behavior During Therapy: WFL for tasks assessed/performed Overall  Cognitive Status: Within Functional Limits for tasks assessed                                        General Comments      Exercises Total Joint Exercises Ankle Circles/Pumps: AROM;10 reps Quad Sets: Strengthening;10 reps Short Arc Quad: AROM;10 reps Heel Slides: Strengthening;10 reps Hip ABduction/ADduction: Strengthening;10 reps Long Arc Quad: Strengthening;10 reps Knee Flexion: PROM;5 reps Goniometric ROM: 0-102 AROM   Assessment/Plan    PT Assessment Patient needs continued PT services  PT Problem List Decreased strength;Decreased range of motion;Decreased activity tolerance;Decreased balance;Decreased  mobility;Decreased safety awareness;Pain;Decreased knowledge of use of DME;Decreased coordination       PT Treatment Interventions DME instruction;Gait training;Stair training;Functional mobility training;Therapeutic activities;Therapeutic exercise;Balance training;Neuromuscular re-education;Patient/family education    PT Goals (Current goals can be found in the Care Plan section)  Acute Rehab PT Goals Patient Stated Goal: go home today PT Goal Formulation: With patient Time For Goal Achievement: 05/16/20 Potential to Achieve Goals: Good    Frequency BID   Barriers to discharge        Co-evaluation               AM-PAC PT "6 Clicks" Mobility  Outcome Measure Help needed turning from your back to your side while in a flat bed without using bedrails?: A Little Help needed moving from lying on your back to sitting on the side of a flat bed without using bedrails?: A Little Help needed moving to and from a bed to a chair (including a wheelchair)?: A Little Help needed standing up from a chair using your arms (e.g., wheelchair or bedside chair)?: A Little Help needed to walk in hospital room?: A Little Help needed climbing 3-5 steps with a railing? : A Little 6 Click Score: 18    End of Session Equipment Utilized During Treatment: Gait belt Activity Tolerance: Patient tolerated treatment well Patient left: with chair alarm set;with call bell/phone within reach Nurse Communication: Mobility status PT Visit Diagnosis: Muscle weakness (generalized) (M62.81);Difficulty in walking, not elsewhere classified (R26.2);Pain Pain - Right/Left: Right Pain - part of body: Knee    Time: 5583-1674 PT Time Calculation (min) (ACUTE ONLY): 30 min   Charges:   PT Evaluation $PT Eval Low Complexity: 1 Low PT Treatments $Gait Training: 8-22 mins $Therapeutic Exercise: 8-22 mins        Malachi Pro, DPT 05/02/2020, 10:55 AM

## 2020-05-02 NOTE — Progress Notes (Signed)
Physical Therapy Treatment Patient Details Name: Kyle Wells MRN: [id number] DOB: Oct 25, 1933 Today's Date: 05/02/2020    History of Present Illness 87 y/o male s/o R TKA 05/01/20, had L knee replaced 11/2019 with good results.    PT Comments    Pt continues to do very well post TKA.  He has slightly more pain than this AM, but still only 4/10 with activity.  he did well with exercises and showed good tolerance with most activityand was able to circumambulate the nurses' station and negotiate up/down steps w/o direct assist.  (O2 did drop to high 80s with prolonged ambulation. Pt doing well and eager to get home and start working with HHPT and his stationary bike.     Follow Up Recommendations  Home health PT;Follow surgeon's recommendation for DC plan and follow-up therapies     Equipment Recommendations  None recommended by PT    Recommendations for Other Services       Precautions / Restrictions Precautions Precautions: Fall Restrictions RLE Weight Bearing: Weight bearing as tolerated    Mobility  Bed Mobility Overal bed mobility: Modified Independent             General bed mobility comments: easily gets supine to sit w/o assist or hesitation    Transfers Overall transfer level: Modified independent Equipment used: Rolling walker (2 wheeled)             General transfer comment: Pt able to rise to standing w/o assist or cueing, using RW. Good safety and confidence  Ambulation/Gait Ambulation/Gait assistance: Supervision Gait Distance (Feet): 200 Feet Assistive device: Rolling walker (2 wheeled)       General Gait Details: Pt again quick to assume consistent walker momentum and good speed/cadence. Pt with only mild fatigue after the effort, O2 down to 88% on room air but back to 90s quickly in sitting.   Stairs Stairs: Yes Stairs assistance: Supervision Stair Management: Two rails Number of Stairs: 4 General stair comments: Pt recalled appropriate  sequencing from last TKA. Despite this, however, he descended final step with surgical leg up yet had the strength and ROM to do so safely   Wheelchair Mobility    Modified Rankin (Stroke Patients Only)       Balance Overall balance assessment: Modified Independent                                          Cognition Arousal/Alertness: Awake/alert Behavior During Therapy: WFL for tasks assessed/performed Overall Cognitive Status: Within Functional Limits for tasks assessed                                        Exercises Total Joint Exercises Ankle Circles/Pumps: AROM;10 reps Quad Sets: Strengthening;10 reps Heel Slides: Strengthening;10 reps (with resisted leg extensions) Hip ABduction/ADduction: Strengthening;10 reps Straight Leg Raises: AROM;10 reps Long Arc Quad: Strengthening;10 reps Knee Flexion: PROM;5 reps Goniometric ROM: >100 flexion    General Comments        Pertinent Vitals/Pain Pain Assessment: 0-10 Pain Score: 4     Home Living                      Prior Function            PT Goals (current goals can now  be found in the care plan section) Progress towards PT goals: Progressing toward goals    Frequency    BID      PT Plan Current plan remains appropriate    Co-evaluation              AM-PAC PT "6 Clicks" Mobility   Outcome Measure  Help needed turning from your back to your side while in a flat bed without using bedrails?: None Help needed moving from lying on your back to sitting on the side of a flat bed without using bedrails?: None Help needed moving to and from a bed to a chair (including a wheelchair)?: None Help needed standing up from a chair using your arms (e.g., wheelchair or bedside chair)?: None Help needed to walk in hospital room?: A Little Help needed climbing 3-5 steps with a railing? : A Little 6 Click Score: 22    End of Session Equipment Utilized During  Treatment: Gait belt Activity Tolerance: Patient tolerated treatment well Patient left: with chair alarm set;with call bell/phone within reach Nurse Communication: Mobility status PT Visit Diagnosis: Muscle weakness (generalized) (M62.81);Difficulty in walking, not elsewhere classified (R26.2);Pain Pain - Right/Left: Right Pain - part of body: Knee     Time: 1500-1526 PT Time Calculation (min) (ACUTE ONLY): 26 min  Charges:  $Gait Training: 8-22 mins $Therapeutic Exercise: 8-22 mins                     Malachi Pro, DPT 05/02/2020, 3:30 PM

## 2020-05-02 NOTE — Anesthesia Postprocedure Evaluation (Signed)
Anesthesia Post Note  Patient: Kyle Wells  Procedure(s) Performed: COMPUTER ASSISTED TOTAL KNEE ARTHROPLASTY (Right Knee)  Patient location during evaluation: Nursing Unit Anesthesia Type: Spinal Level of consciousness: oriented and awake and alert Pain management: pain level controlled Vital Signs Assessment: post-procedure vital signs reviewed and stable Respiratory status: spontaneous breathing and respiratory function stable Cardiovascular status: blood pressure returned to baseline and stable Postop Assessment: no headache, no backache, no apparent nausea or vomiting, patient able to bend at knees and able to ambulate Anesthetic complications: no   No complications documented.   Last Vitals:  Vitals:   05/01/20 2323 05/02/20 0307  BP: 117/78 103/62  Pulse: 83 67  Resp: 18 17  Temp: (!) 36.3 C (!) 36.4 C  SpO2: 96% 96%    Last Pain:  Vitals:   05/02/20 0258  TempSrc:   PainSc: 8                  Lynden Oxford

## 2020-05-02 NOTE — Progress Notes (Signed)
Met with the patient at the bedside to discuss DC plan and needs, he is set up with [facility] PT, He has a RW and a 3 in 1 at home from previous surgery 3 months ago, he is familiar with the price of Lovenox. No additinal needs

## 2020-05-02 NOTE — Discharge Summary (Signed)
Physician Discharge Summary  Patient ID: Kyle Wells MRN: [id number] DOB/AGE: Jul 27, 1933 87 y.o.  Admit date: 05/01/2020 Discharge date: 05/02/2020  Admission Diagnoses:  Total knee replacement status [Z96.659]  Surgeries:Procedure(s):  Right total knee arthroplasty using computer-assisted navigation  SURGEON:  Jena Gauss. M.D.  ASSISTANT: Baldwin Jamaica, PA-C (present and scrubbed throughout the case, critical for assistance with exposure, retraction, instrumentation, and closure)  ANESTHESIA: spinal  ESTIMATED BLOOD LOSS: 50 mL  FLUIDS REPLACED: 1100 mL of crystalloid  TOURNIQUET TIME: 109 minutes  DRAINS: 2 medium Hemovac  SOFT TISSUE RELEASES: Anterior cruciate ligament, posterior cruciate ligament, deep and superficial medial collateral ligament, patellofemoral ligament  IMPLANTS UTILIZED: DePuy Attune size 7 posterior stabilized femoral component (cemented), size 7 rotating platform tibial component (cemented), 38 mm medialized dome patella (cemented), and a 6 mm stabilized rotating platform polyethylene insert.  Discharge Diagnoses: Patient Active Problem List   Diagnosis Date Noted  . Total knee replacement status 05/01/2020  . Anticoagulated   . Anemia 12/20/2019  . Hematemesis 12/20/2019  . Tobacco abuse 12/20/2019  . Gastro-esophageal reflux disease with esophagitis, with bleeding   . Status post total left knee replacement 12/08/2019  . History of pulmonary embolism 06/14/2019  . Multiple subsegmental pulmonary emboli without acute cor pulmonale (HCC) 06/14/2019  . Intractable nausea and vomiting 05/28/2019  . Primary osteoarthritis of both knees 11/03/2017  . SBO (small bowel obstruction) (HCC) 05/07/2017  . Small bowel obstruction (HCC) 12/10/2016  . Healthcare maintenance 12/02/2016  . Nausea   . Obesity, unspecified 09/26/2016  . Obstructive sleep apnea on CPAP 11/02/2015  . Chronic obstructive pulmonary disease (HCC)   . Intestinal  obstruction (HCC)   . Ileus (HCC) 09/19/2014  . Impingement syndrome of shoulder, right 05/13/2014  . Benign localized hyperplasia of prostate with urinary obstruction and other lower urinary tract symptoms (LUTS)(600.21) 03/30/2014  . Hypercholesteremia 03/30/2014  . Sleep apnea 03/03/2012  . Benign localized prostatic hyperplasia with lower urinary tract symptoms (LUTS) 10/16/2011  . Elevated prostate specific antigen (PSA) 10/16/2011  . BPH (benign prostatic hyperplasia) 10/16/2011  . Incomplete emptying of bladder 10/16/2011  . Frequency of micturition 10/16/2011  . Retention of urine 10/16/2011  . Asthma with COPD (HCC) 05/31/2011  . Chronic obstructive asthma (HCC) 05/31/2011  . House dust mite allergy 10/20/2010  . Other allergy, other than to medicinal agents 10/20/2010    Past Medical History:  Diagnosis Date  . Arthritis   . Asthma   . BPH (benign prostatic hyperplasia)   . COPD (chronic obstructive pulmonary disease) (HCC)   . Hypercholesterolemia   . Impingement syndrome of right shoulder   . OSA (obstructive sleep apnea)   . Small bowel obstruction (HCC)      Transfusion:    Consultants (if any):   Discharged Condition: Improved  Hospital Course: Kyle Wells is an 87 y.o. male who was admitted 05/01/2020 with a diagnosis of right knee osteoarthritis and went to the operating room on 05/01/2020 and underwent right total knee arthroplasty. The patient received perioperative antibiotics for prophylaxis (see below). The patient tolerated the procedure well and was transported to PACU in stable condition. After meeting PACU criteria, the patient was subsequently transferred to the Orthopaedics/Rehabilitation unit.   The patient received DVT prophylaxis in the form of early mobilization, Xarelto, Foot Pumps and TED hose. A sacral pad had been placed and heels were elevated off of the bed with rolled towels in order to protect skin integrity. Foley catheter was  discontinued  on postoperative day #0. Wound drains were discontinued on postoperative day #1. The surgical incision was healing well without signs of infection.  Physical therapy was initiated postoperatively for transfers, gait training, and strengthening. Occupational therapy was initiated for activities of daily living and evaluation for assisted devices. Rehabilitation goals were reviewed in detail with the patient. The patient made steady progress with physical therapy and physical therapy recommended discharge to Home.   The patient achieved the preliminary goals of this hospitalization and was felt to be medically and orthopaedically appropriate for discharge.  He was given perioperative antibiotics:  Anti-infectives (From admission, onward)   Start     Dose/Rate Route Frequency Ordered Stop   05/01/20 1800  ceFAZolin (ANCEF) IVPB 2g/100 mL premix        2 g 200 mL/hr over 30 Minutes Intravenous Every 6 hours 05/01/20 1649 05/02/20 0059   05/01/20 0955  ceFAZolin (ANCEF) 2-4 GM/100ML-% IVPB       Note to Pharmacy: Rayann Heman   : cabinet override      05/01/20 0955 05/01/20 1154   05/01/20 0600  ceFAZolin (ANCEF) IVPB 2g/100 mL premix        2 g 200 mL/hr over 30 Minutes Intravenous On call to O.R. 04/30/20 2304 05/01/20 1140    .  Recent vital signs:  Vitals:   05/02/20 0840 05/02/20 1609  BP: 118/69 121/62  Pulse: 65 85  Resp: 15 18  Temp: 98.3 F (36.8 C) 98.4 F (36.9 C)  SpO2: 98% 97%    Recent laboratory studies:  No results for input(s): WBC, HGB, HCT, PLT, K, CL, CO2, BUN, CREATININE, GLUCOSE, CALCIUM, LABPT, INR in the last 72 hours.  Diagnostic Studies: DG Knee Right Port  Result Date: 05/01/2020 CLINICAL DATA:  Status post right knee replacement. EXAM: PORTABLE RIGHT KNEE - 1-2 VIEW COMPARISON:  None. FINDINGS: The right femoral and tibial components are well situated. Surgical drain is seen in the soft tissues anterior to the distal femur. IMPRESSION:  Status post right total knee arthroplasty. Electronically Signed   By: Lupita Raider M.D.   On: 05/01/2020 16:16    Discharge Medications:   Allergies as of 05/02/2020      Reactions   Zileuton Hives   [Onset: 10/27/2009]   Roflumilast Other (See Comments)   insomnia   [Onset: 11/29/2010] Insomnia      Medication List    TAKE these medications   acetaminophen 650 MG CR tablet Commonly known as: TYLENOL Take 1,300 mg by mouth 2 (two) times daily.   Advair Diskus 500-50 MCG/DOSE Aepb Generic drug: Fluticasone-Salmeterol Inhale 1 puff into the lungs 2 (two) times daily.   albuterol 108 (90 Base) MCG/ACT inhaler Commonly known as: VENTOLIN HFA Inhale 2 puffs into the lungs every 4 (four) hours as needed for wheezing or shortness of breath.   albuterol (2.5 MG/3ML) 0.083% nebulizer solution Commonly known as: PROVENTIL Inhale 3 mLs into the lungs every 6 (six) hours as needed for wheezing or shortness of breath.   azelastine 0.1 % nasal spray Commonly known as: ASTELIN Place 2 sprays into both nostrils daily as needed for allergies.   docusate sodium 100 MG capsule Commonly known as: COLACE Take 100 mg by mouth daily in the afternoon.   finasteride 5 MG tablet Commonly known as: PROSCAR Take 1 tablet (5 mg total) by mouth daily. What changed: when to take this   montelukast 10 MG tablet Commonly known as: SINGULAIR Take 10 mg by mouth  at bedtime.   multivitamin capsule Take 1 capsule by mouth daily.   oxyCODONE 5 MG immediate release tablet Commonly known as: Oxy IR/ROXICODONE Take 1 tablet (5 mg total) by mouth every 4 (four) hours as needed for moderate pain (pain score 4-6).   pantoprazole 40 MG tablet Commonly known as: Protonix Take 1 tablet (40 mg total) by mouth 2 (two) times daily.   polyethylene glycol 17 g packet Commonly known as: MIRALAX / GLYCOLAX Take 17 g by mouth daily. What changed: when to take this   rivaroxaban 20 MG Tabs tablet Commonly  known as: Xarelto Take 1 tablet (20 mg total) by mouth daily.   Spiriva HandiHaler 18 MCG inhalation capsule Generic drug: tiotropium Place 18 mcg into inhaler and inhale daily.   traZODone 50 MG tablet Commonly known as: DESYREL Take 50 mg by mouth at bedtime.            Durable Medical Equipment  (From admission, onward)         Start     Ordered   05/01/20 1650  DME Walker rolling  Once       Question:  Patient needs a walker to treat with the following condition  Answer:  Total knee replacement status   05/01/20 1649   05/01/20 1650  DME Bedside commode  Once       Question:  Patient needs a bedside commode to treat with the following condition  Answer:  Total knee replacement status   05/01/20 1649          Disposition: home with home health PT      Follow-up Information    My[facility] On 05/16/2020.   Specialty: Orthopedic Surgery Why: at 10:45am Contact information: [address] [facility] [place] [address] [phone]        Donato Heinz, MD On 06/08/2020.   Specialty: Orthopedic Surgery Why: at 1:45pm Contact information: [address] [facility] [place] [address] [phone]                Lasandra Beech, PA-C 05/02/2020, 5:41 PM

## 2020-05-02 NOTE — Plan of Care (Signed)
Post-op dressing removed. Hemovac removed. Mini compression dressing applied.  

## 2020-06-07 ENCOUNTER — Other Ambulatory Visit: Payer: Self-pay

## 2020-06-07 ENCOUNTER — Emergency Department: Payer: Medicare Other

## 2020-06-07 ENCOUNTER — Inpatient Hospital Stay
Admission: EM | Admit: 2020-06-07 | Discharge: 2020-06-09 | DRG: 812 | Disposition: A | Discharge status: Home / Self Care | Payer: Medicare Other | Attending: Internal Medicine | Admitting: Internal Medicine

## 2020-06-07 DIAGNOSIS — D696 Thrombocytopenia, unspecified: Secondary | ICD-10-CM | POA: Diagnosis not present

## 2020-06-07 DIAGNOSIS — E7800 Pure hypercholesterolemia, unspecified: Secondary | ICD-10-CM | POA: Diagnosis not present

## 2020-06-07 DIAGNOSIS — K449 Diaphragmatic hernia without obstruction or gangrene: Secondary | ICD-10-CM | POA: Diagnosis not present

## 2020-06-07 DIAGNOSIS — D62 Acute posthemorrhagic anemia: Secondary | ICD-10-CM | POA: Diagnosis not present

## 2020-06-07 DIAGNOSIS — R791 Abnormal coagulation profile: Secondary | ICD-10-CM | POA: Diagnosis not present

## 2020-06-07 DIAGNOSIS — Z96653 Presence of artificial knee joint, bilateral: Secondary | ICD-10-CM | POA: Diagnosis not present

## 2020-06-07 DIAGNOSIS — Z825 Family history of asthma and other chronic lower respiratory diseases: Secondary | ICD-10-CM | POA: Diagnosis not present

## 2020-06-07 DIAGNOSIS — K2970 Gastritis, unspecified, without bleeding: Secondary | ICD-10-CM | POA: Diagnosis not present

## 2020-06-07 DIAGNOSIS — Z95828 Presence of other vascular implants and grafts: Secondary | ICD-10-CM

## 2020-06-07 DIAGNOSIS — G4700 Insomnia, unspecified: Secondary | ICD-10-CM | POA: Diagnosis not present

## 2020-06-07 DIAGNOSIS — K641 Second degree hemorrhoids: Secondary | ICD-10-CM | POA: Diagnosis not present

## 2020-06-07 DIAGNOSIS — D649 Anemia, unspecified: Secondary | ICD-10-CM

## 2020-06-07 DIAGNOSIS — N400 Enlarged prostate without lower urinary tract symptoms: Secondary | ICD-10-CM | POA: Diagnosis not present

## 2020-06-07 DIAGNOSIS — K635 Polyp of colon: Secondary | ICD-10-CM | POA: Diagnosis present

## 2020-06-07 DIAGNOSIS — R0602 Shortness of breath: Secondary | ICD-10-CM | POA: Diagnosis present

## 2020-06-07 DIAGNOSIS — Z86718 Personal history of other venous thrombosis and embolism: Secondary | ICD-10-CM | POA: Diagnosis not present

## 2020-06-07 DIAGNOSIS — Z20822 Contact with and (suspected) exposure to covid-19: Secondary | ICD-10-CM | POA: Diagnosis present

## 2020-06-07 DIAGNOSIS — E785 Hyperlipidemia, unspecified: Secondary | ICD-10-CM | POA: Diagnosis not present

## 2020-06-07 DIAGNOSIS — K5730 Diverticulosis of large intestine without perforation or abscess without bleeding: Secondary | ICD-10-CM | POA: Diagnosis present

## 2020-06-07 DIAGNOSIS — K621 Rectal polyp: Secondary | ICD-10-CM | POA: Diagnosis present

## 2020-06-07 DIAGNOSIS — F17210 Nicotine dependence, cigarettes, uncomplicated: Secondary | ICD-10-CM | POA: Diagnosis present

## 2020-06-07 DIAGNOSIS — J449 Chronic obstructive pulmonary disease, unspecified: Secondary | ICD-10-CM | POA: Diagnosis not present

## 2020-06-07 DIAGNOSIS — G4730 Sleep apnea, unspecified: Secondary | ICD-10-CM

## 2020-06-07 DIAGNOSIS — Z96652 Presence of left artificial knee joint: Secondary | ICD-10-CM

## 2020-06-07 DIAGNOSIS — Z7901 Long term (current) use of anticoagulants: Secondary | ICD-10-CM

## 2020-06-07 DIAGNOSIS — K922 Gastrointestinal hemorrhage, unspecified: Secondary | ICD-10-CM | POA: Diagnosis not present

## 2020-06-07 DIAGNOSIS — Z66 Do not resuscitate: Secondary | ICD-10-CM | POA: Diagnosis present

## 2020-06-07 DIAGNOSIS — Z86711 Personal history of pulmonary embolism: Secondary | ICD-10-CM

## 2020-06-07 DIAGNOSIS — G4733 Obstructive sleep apnea (adult) (pediatric): Secondary | ICD-10-CM | POA: Diagnosis not present

## 2020-06-07 DIAGNOSIS — Z7951 Long term (current) use of inhaled steroids: Secondary | ICD-10-CM

## 2020-06-07 DIAGNOSIS — Z886 Allergy status to analgesic agent status: Secondary | ICD-10-CM | POA: Diagnosis not present

## 2020-06-07 DIAGNOSIS — Z79899 Other long term (current) drug therapy: Secondary | ICD-10-CM

## 2020-06-07 DIAGNOSIS — Z8719 Personal history of other diseases of the digestive system: Secondary | ICD-10-CM

## 2020-06-07 LAB — BASIC METABOLIC PANEL
Anion gap: 6 (ref 5–15)
BUN: 14 mg/dL (ref 8–23)
CO2: 25 mmol/L (ref 22–32)
Calcium: 9.1 mg/dL (ref 8.9–10.3)
Chloride: 103 mmol/L (ref 98–111)
Creatinine, Ser: 0.74 mg/dL (ref 0.61–1.24)
GFR, Estimated: >60 mL/min (ref >60)
Glucose, Bld: 94 mg/dL (ref 70–99)
Potassium: 4 mmol/L (ref 3.5–5.1)
Sodium: 134 mmol/L — ABNORMAL LOW (ref 135–145)

## 2020-06-07 LAB — TROPONIN I (HIGH SENSITIVITY)
Troponin I (High Sensitivity): 3 ng/L (ref <18)
Troponin I (High Sensitivity): 3 ng/L (ref <18)

## 2020-06-07 LAB — CBC
HCT: 22.7 % — ABNORMAL LOW (ref 39.0–52.0)
Hemoglobin: 6.6 g/dL — ABNORMAL LOW (ref 13.0–17.0)
MCH: 21 pg — ABNORMAL LOW (ref 26.0–34.0)
MCHC: 29.1 g/dL — ABNORMAL LOW (ref 30.0–36.0)
MCV: 72.1 fL — ABNORMAL LOW (ref 80.0–100.0)
Platelets: 161 10*3/uL (ref 150–400)
RBC: 3.15 MIL/uL — ABNORMAL LOW (ref 4.22–5.81)
RDW: 19.7 % — ABNORMAL HIGH (ref 11.5–15.5)
WBC: 4.1 10*3/uL (ref 4.0–10.5)
nRBC: 0 % (ref 0.0–0.2)

## 2020-06-07 LAB — RESP PANEL BY RT-PCR (FLU A&B, COVID) ARPGX2
Influenza A by PCR: NEGATIVE
Influenza B by PCR: NEGATIVE
SARS Coronavirus 2 by RT PCR: NEGATIVE

## 2020-06-07 LAB — PROTIME-INR
INR: 1.9 — ABNORMAL HIGH (ref 0.8–1.2)
Prothrombin Time: 21.6 seconds — ABNORMAL HIGH (ref 11.4–15.2)

## 2020-06-07 LAB — PREPARE RBC (CROSSMATCH)

## 2020-06-07 MED ORDER — ONDANSETRON HCL 4 MG/2ML IJ SOLN: 4.0000 mg | Freq: Four times a day (QID) | INTRAMUSCULAR | Status: DC | PRN

## 2020-06-07 MED ORDER — MOMETASONE FURO-FORMOTEROL FUM 200-5 MCG/ACT IN AERO
2.0000 | INHALATION_SPRAY | Freq: Two times a day (BID) | RESPIRATORY_TRACT | Status: DC
  Administered 2020-06-07 – 2020-06-09 (×4): 2 via RESPIRATORY_TRACT
  Filled 2020-06-07 (×2): qty 8.8

## 2020-06-07 MED ORDER — FINASTERIDE 5 MG PO TABS
5.0000 mg | ORAL_TABLET | Freq: Every day | ORAL | Status: DC
  Administered 2020-06-07 – 2020-06-08 (×2): 5 mg via ORAL
  Filled 2020-06-07 (×3): qty 1

## 2020-06-07 MED ORDER — OXYCODONE HCL 5 MG PO TABS: 5.0000 mg | ORAL_TABLET | ORAL | Status: DC | PRN

## 2020-06-07 MED ORDER — SODIUM CHLORIDE 0.9 % IV SOLN
10.0000 mL/h | Freq: Once | INTRAVENOUS | Status: AC
  Administered 2020-06-07: 10 mL/h via INTRAVENOUS

## 2020-06-07 MED ORDER — TRAMADOL HCL 50 MG PO TABS: 50.0000 mg | ORAL_TABLET | ORAL | Status: DC | PRN

## 2020-06-07 MED ORDER — ALBUTEROL SULFATE (2.5 MG/3ML) 0.083% IN NEBU
3.0000 mL | INHALATION_SOLUTION | Freq: Four times a day (QID) | RESPIRATORY_TRACT | Status: DC | PRN
  Administered 2020-06-07 – 2020-06-09 (×3): 3 mL via RESPIRATORY_TRACT
  Filled 2020-06-07 (×4): qty 3

## 2020-06-07 MED ORDER — AZELASTINE HCL 0.1 % NA SOLN
2.0000 | Freq: Every day | NASAL | Status: DC | PRN
  Filled 2020-06-07: qty 30

## 2020-06-07 MED ORDER — SODIUM CHLORIDE 0.9 % IV SOLN: INTRAVENOUS | Status: AC

## 2020-06-07 MED ORDER — TRAZODONE HCL 50 MG PO TABS
50.0000 mg | ORAL_TABLET | Freq: Every day | ORAL | Status: DC
  Administered 2020-06-07: 50 mg via ORAL
  Filled 2020-06-07: qty 1

## 2020-06-07 MED ORDER — POLYETHYLENE GLYCOL 3350 17 G PO PACK
17.0000 g | PACK | Freq: Every day | ORAL | Status: DC
  Administered 2020-06-07: 17 g via ORAL
  Filled 2020-06-07: qty 1

## 2020-06-07 MED ORDER — TIOTROPIUM BROMIDE MONOHYDRATE 18 MCG IN CAPS
18.0000 ug | ORAL_CAPSULE | Freq: Every day | RESPIRATORY_TRACT | Status: DC
  Administered 2020-06-07 – 2020-06-09 (×3): 18 ug via RESPIRATORY_TRACT
  Filled 2020-06-07 (×2): qty 5

## 2020-06-07 MED ORDER — PANTOPRAZOLE SODIUM 40 MG IV SOLR
40.0000 mg | Freq: Two times a day (BID) | INTRAVENOUS | Status: DC
  Administered 2020-06-07 – 2020-06-09 (×5): 40 mg via INTRAVENOUS
  Filled 2020-06-07 (×5): qty 40

## 2020-06-07 MED ORDER — ACETAMINOPHEN 325 MG PO TABS
650.0000 mg | ORAL_TABLET | Freq: Four times a day (QID) | ORAL | Status: DC | PRN
  Administered 2020-06-07: 650 mg via ORAL
  Filled 2020-06-07: qty 2

## 2020-06-07 MED ORDER — ACETAMINOPHEN 650 MG RE SUPP: 650.0000 mg | Freq: Four times a day (QID) | RECTAL | Status: DC | PRN

## 2020-06-07 MED ORDER — ACETAMINOPHEN 500 MG PO TABS
1000.0000 mg | ORAL_TABLET | Freq: Once | ORAL | Status: AC
  Administered 2020-06-07: 1000 mg via ORAL
  Filled 2020-06-07: qty 2

## 2020-06-07 MED ORDER — DOCUSATE SODIUM 100 MG PO CAPS
100.0000 mg | ORAL_CAPSULE | Freq: Every day | ORAL | Status: DC
  Administered 2020-06-07 – 2020-06-08 (×2): 100 mg via ORAL
  Filled 2020-06-07 (×2): qty 1

## 2020-06-07 MED ORDER — ONDANSETRON HCL 4 MG PO TABS: 4.0000 mg | ORAL_TABLET | Freq: Four times a day (QID) | ORAL | Status: DC | PRN

## 2020-06-07 MED ORDER — MONTELUKAST SODIUM 10 MG PO TABS
10.0000 mg | ORAL_TABLET | Freq: Every day | ORAL | Status: DC
  Administered 2020-06-07 – 2020-06-08 (×2): 10 mg via ORAL
  Filled 2020-06-07 (×2): qty 1

## 2020-06-07 MED ORDER — ACETAMINOPHEN 500 MG PO TABS
1000.0000 mg | ORAL_TABLET | Freq: Two times a day (BID) | ORAL | Status: AC
  Administered 2020-06-08: 1000 mg via ORAL
  Filled 2020-06-07: qty 2

## 2020-06-07 NOTE — ED Notes (Signed)
Pt able to use urinal without assistance.  

## 2020-06-07 NOTE — H&P (Addendum)
[facility] History and Physical  Kyle MunroJames W Archuleta XBJ:[id number] DOB: 1933/10/25 DOA: 06/07/2020  Referring physician: ED  PCP: Lauro RegulusAnderson, Marshall W, MD   Patient is coming from: Home  Chief Complaint: dyspnea on exertion  HPI: Kyle Wells is a 87 y.o. 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  He also had an episode of syncope 4 days back while he was walking but did not hit his head or had trauma this lasted for few seconds.  Currently, he has been having shortness of breath whenever he exerts.  Denies any cough, fever, chills or rigor.  Denies sick contacts no congestion, runny nose or sore throat.  Patient has been taking Xarelto for his history of DVT/PE and has a history of IVC filter.    ED Course: In the ED, patient hemodynamically stable.  Lab data showed Hemoglobin of 6.6 MCV low at 72.  INR elevated at 1.9.  COVID test was negative.  Patient was initiated on 1 unit of packed RBC in the ED.  Per rectal examination was positive for stool occult blood and had some red-tinged blood per rectum.  Patient was then considered for admission to hospital for possible endoscopic evaluation/ transfusion and GI was consulted.  Review of Systems:  All systems were reviewed and were negative unless otherwise mentioned in the HPI  Past Medical History:  Diagnosis Date  . Arthritis   . Asthma   . BPH (benign prostatic hyperplasia)   . COPD (chronic obstructive pulmonary disease) (HCC)   . Hypercholesterolemia   . Impingement syndrome of right shoulder   . OSA (obstructive sleep apnea)   . Small bowel obstruction [facility](HCC)    Past Surgical History:  Procedure Laterality Date  . COLONOSCOPY    . ESOPHAGOGASTRODUODENOSCOPY (EGD) WITH PROPOFOL N/A 12/20/2019    Procedure: ESOPHAGOGASTRODUODENOSCOPY (EGD) WITH PROPOFOL;  Surgeon: Midge MiniumWohl, Darren, MD;  [facility];  Service: Endoscopy;  Laterality: N/A;  . IVC FILTER INSERTION N/A 11/26/2019   Procedure: IVC FILTER INSERTION;  Surgeon: Renford DillsSchnier, Gregory G, MD;  Location: [facility] INVASIVE CV LAB;  Service: Cardiovascular;  Laterality: N/A;  . JOINT REPLACEMENT    . KNEE ARTHROPLASTY Left 12/08/2019   Procedure: COMPUTER ASSISTED TOTAL KNEE ARTHROPLASTY;  Surgeon: Donato HeinzHooten, Jocsan P, MD;  Location: [facility] ORS;  Service: Orthopedics;  Laterality: Left;  . KNEE ARTHROPLASTY Right 05/01/2020   Procedure: COMPUTER ASSISTED TOTAL KNEE ARTHROPLASTY;  Surgeon: Donato HeinzHooten, Geneva P, MD;  Location: [facility] ORS;  Service: Orthopedics;  Laterality: Right;  . PROSTATE BIOPSY      Social History:  reports that he has been smoking cigarettes. He has been smoking about 0.10 packs per day. He has never used smokeless tobacco. He reports that he does not drink alcohol and does not use drugs.  Allergies  Allergen Reactions  . Zileuton Hives    [Onset: 10/27/2009]    . Roflumilast Other (See Comments)    insomnia   [Onset: 11/29/2010] Insomnia    Family History  Problem Relation Age of Onset  . Asthma Father   . Heart attack Father      Prior to Admission medications   Medication Sig Start Date End Date Taking? Authorizing Provider  acetaminophen (TYLENOL) 650 MG CR tablet Take 1,300 mg by mouth 2 (two) times daily.    [provider]  ADVAIR DISKUS 500-50 MCG/DOSE AEPB Inhale 1 puff into the lungs 2 (two) times daily.  09/12/14   [provider]  albuterol (PROVENTIL HFA;VENTOLIN HFA) 108 (90 Base) MCG/ACT inhaler Inhale 2 puffs into the lungs every 4 (four) hours as needed for wheezing or shortness of breath.    [provider]  albuterol (PROVENTIL) (2.5 MG/3ML) 0.083% nebulizer solution Inhale 3 mLs into the lungs every 6 (six) hours as needed for wheezing or shortness of breath.  02/21/14    [provider]  azelastine (ASTELIN) 0.1 % nasal spray Place 2 sprays into both nostrils daily as needed for allergies.  05/21/19   [provider]  docusate sodium (COLACE) 100 MG capsule Take 100 mg by mouth daily in the afternoon.    [provider]  finasteride (PROSCAR) 5 MG tablet Take 1 tablet (5 mg total) by mouth daily. Patient taking differently: Take 5 mg by mouth at bedtime. 09/21/14   Lattie Haw, MD  montelukast (SINGULAIR) 10 MG tablet Take 10 mg by mouth at bedtime.  09/12/14   [provider]  Multiple Vitamin (MULTIVITAMIN) capsule Take 1 capsule by mouth daily.    [provider]  oxyCODONE (OXY IR/ROXICODONE) 5 MG immediate release tablet Take 1 tablet (5 mg total) by mouth every 4 (four) hours as needed for moderate pain (pain score 4-6). 05/02/20   Lasandra Beech B, PA-C  pantoprazole (PROTONIX) 40 MG tablet Take 1 tablet (40 mg total) by mouth 2 (two) times daily. 12/21/19 01/20/20  Delfino Lovett, MD  polyethylene glycol (MIRALAX / GLYCOLAX) packet Take 17 g by mouth daily. Patient taking differently: Take 17 g by mouth at bedtime. 05/09/17   Katha Hamming, MD  rivaroxaban (XARELTO) 20 MG TABS tablet Take 1 tablet (20 mg total) by mouth daily. 12/27/19   Delfino Lovett, MD  SPIRIVA HANDIHALER 18 MCG inhalation capsule Place 18 mcg into inhaler and inhale daily. 09/13/14   [provider]  traZODone (DESYREL) 50 MG tablet Take 50 mg by mouth at bedtime. 02/08/20   [provider]    Physical Exam: Vitals:   06/07/20 1048 06/07/20 1200  BP: 133/66 (!) 115/57  Pulse: 71 67  Resp: 18 14  Temp: 97.8 F (36.6 C)   TempSrc: Oral   SpO2: 97% 100%   Wt Readings from Last 3 Encounters:  05/01/20 91.6 kg  01/06/20 90.3 kg  12/20/19 90.7 kg   There is no height or weight on file to calculate BMI.  General:  Average built, not in obvious distress, mildly hard of hearing HENT: Normocephalic, pupils equally  reacting to light and accommodation.   Mild pallor noted. Oral mucosa is moist.  Chest:  Clear breath sounds.  Diminished breath sounds bilaterally. No crackles or wheezes.  CVS: S1 &S2 heard. No murmur.  Regular rate and rhythm. Abdomen: Soft, nontender, nondistended.  Bowel sounds are heard.  Liver is not palpable, no abdominal mass palpated.  Digital rectal in the ED by ED provider showed some  blood Extremities: No cyanosis, clubbing or edema.  Peripheral pulses are palpable. Psych: Alert, awake and oriented, normal mood CNS:  No cranial nerve deficits.  Power equal in all extremities.   No cerebellar signs.   Skin: Warm and dry.  No rashes noted.  Labs on Admission:   CBC: Recent Labs  Lab 06/07/20 1116  WBC 4.1  HGB 6.6*  HCT 22.7*  MCV 72.1*  PLT 161    Basic  Metabolic Panel: Recent Labs  Lab 06/07/20 1116  NA 134*  K 4.0  CL 103  CO2 25  GLUCOSE 94  BUN 14  CREATININE 0.74  CALCIUM 9.1    Liver Function Tests: No results for input(s): AST, ALT, ALKPHOS, BILITOT, PROT, ALBUMIN in the last 168 hours. No results for input(s): LIPASE, AMYLASE in the last 168 hours. No results for input(s): AMMONIA in the last 168 hours.  Cardiac Enzymes: No results for input(s): CKTOTAL, CKMB, CKMBINDEX, TROPONINI in the last 168 hours.  BNP (last 3 results) No results for input(s): BNP in the last 8760 hours.  ProBNP (last 3 results) No results for input(s): PROBNP in the last 8760 hours.  CBG: No results for input(s): GLUCAP in the last 168 hours.  Lipase     Component Value Date/Time   LIPASE 17 05/28/2019 0603     Urinalysis    Component Value Date/Time   COLORURINE YELLOW (A) 04/20/2020 1108   APPEARANCEUR CLEAR (A) 04/20/2020 1108   LABSPEC 1.027 04/20/2020 1108   PHURINE 5.0 04/20/2020 1108   GLUCOSEU NEGATIVE 04/20/2020 1108   HGBUR NEGATIVE 04/20/2020 1108   BILIRUBINUR NEGATIVE 04/20/2020 1108   KETONESUR NEGATIVE 04/20/2020 1108   PROTEINUR NEGATIVE  04/20/2020 1108   NITRITE NEGATIVE 04/20/2020 1108   LEUKOCYTESUR NEGATIVE 04/20/2020 1108     Drugs of Abuse  No results found for: LABOPIA, COCAINSCRNUR, LABBENZ, AMPHETMU, THCU, LABBARB    Radiological Exams on Admission: DG Chest 2 View  Result Date: 06/07/2020 CLINICAL DATA:  Shortness of breath for 4 days EXAM: CHEST - 2 VIEW COMPARISON:  05/06/2017 FINDINGS: Normal heart size. Aortic tortuosity. Linear scarring or atelectasis at the right base. There is no edema, consolidation, effusion, or pneumothorax. IMPRESSION: Mild scarring or atelectasis.  No edema or consolidation. Electronically Signed   By: Marnee Spring M.D.   On: 06/07/2020 11:27    EKG: Personally reviewed by me which shows normal sinus rhythm.  Assessment/Plan  Principal Problem:   Symptomatic anemia Active Problems:   Asthma with COPD (HCC)   Sleep apnea   History of pulmonary embolism   Status post total left knee replacement   GI bleed  Symptomatic anemia likely secondary to chronic GI bleed.  Patient was FOBT positive and had red tinged stool on DRE.  Likely need endoscopic evaluation.  Patient is on Xarelto as outpatient for DVT and PE.  Hold Xarelto for now.  Continue IV PPI.  Spoke with  Dr. Norma Fredrickson for consultation.Marland Kitchen  History of asthma, COPD.  Continue with oxygen nebulizers and supportive care.  Currently compensated.  History of sleep apnea.  Uses CPAP at home.  Will resume  History of DVT pulmonary embolism status post IVC filter on Xarelto currently.  The patient's daughter at bedside states that there was some discussion with her to remove the IVC filter soon.  We will hold off with Xarelto for now.   DVT Prophylaxis: SCD due to GI bleed  Consultant: GI  Code Status: DNR  Microbiology none  Antibiotics: None  Family Communication:  Patients' condition and plan of care including tests being ordered have been discussed with the patient and the patient's daughter at bedside who indicate  understanding and agree with the plan.   Status is: Observation  The patient remains OBS appropriate and will d/c before 2 midnights.  Dispo: The patient is from: Home              Anticipated d/c is to: Home  Patient currently is not medically stable to d/c.   Difficult to place patient No  Severity of Illness: The appropriate patient status for this patient is OBSERVATION. Observation status is judged to be reasonable and necessary in order to provide the required intensity of service to ensure the patient's safety. The patient's presenting symptoms, physical exam findings, and initial radiographic and laboratory data in the context of their medical condition is felt to place them at decreased risk for further clinical deterioration. Furthermore, it is anticipated that the patient will be medically stable for discharge from the hospital within 2 midnights of admission. The following factors support the patient status of observation.   Signed, Joycelyn Das, MD [facility] 06/07/2020

## 2020-06-07 NOTE — ED Notes (Signed)
Informed RN bed assigned 

## 2020-06-07 NOTE — ED Triage Notes (Signed)
Pt comes with c/o SOB since Sunday. Pt states he got call today that he needed to come here for blood transfusion. Pt states SOB more when ambulating and with exertion.  Pt denies any CP.

## 2020-06-07 NOTE — Consult Note (Signed)
[facility] GI Inpatient Consult Note   Jamey Reas, M.D.  Reason for Consult: Anemia secondary to gastrointestinal bleeding   Attending Requesting Consult: Joycelyn Das, M.D.   History of Present Illness: Kyle Wells is a 87 y.o. male with reported weakness, Dyspnea on exertion and noted to have marked anemia (Hgb 6.6) on bloodwork.He denies any symptoms of hemetemesis, melena, abdominal pain or hematochezia. He denies any change of bowel habits, though he seems to be a suboptimal historian due to advanced age and difficulty with hearing.  He has been evaluated in the past by EGD on 12/20/2019 by Dr. Servando Snare who noted Grade "B" esophagitis, hiatal hernia and a mallory weiss tear as the etiology of bleeding at that time.  Patient reports a colonoscopy was performed by Dr. Mechele Collin "several years ago (possibly less than 10)" that was "normal".  Past Medical History:  Past Medical History:  Diagnosis Date  . Arthritis   . Asthma   . BPH (benign prostatic hyperplasia)   . COPD (chronic obstructive pulmonary disease) (HCC)   . Hypercholesterolemia   . Impingement syndrome of right shoulder   . OSA (obstructive sleep apnea)   . Small bowel obstruction (HCC)     Problem List: Patient Active Problem List   Diagnosis Date Noted  . Symptomatic anemia 06/07/2020  . GI bleed 06/07/2020  . Total knee replacement status 05/01/2020  . Anticoagulated   . Anemia 12/20/2019  . Hematemesis 12/20/2019  . Tobacco abuse 12/20/2019  . Gastro-esophageal reflux disease with esophagitis, with bleeding   . Status post total left knee replacement 12/08/2019  . History of pulmonary embolism 06/14/2019  . Multiple subsegmental pulmonary emboli without acute cor pulmonale (HCC) 06/14/2019  . Intractable nausea and vomiting 05/28/2019  . Primary osteoarthritis of both knees 11/03/2017  . SBO (small bowel obstruction) (HCC) 05/07/2017  . Small bowel obstruction (HCC) 12/10/2016  .  Healthcare maintenance 12/02/2016  . Nausea   . Obesity, unspecified 09/26/2016  . Obstructive sleep apnea on CPAP 11/02/2015  . Chronic obstructive pulmonary disease (HCC)   . Intestinal obstruction (HCC)   . Ileus (HCC) 09/19/2014  . Impingement syndrome of shoulder, right 05/13/2014  . Benign localized hyperplasia of prostate with urinary obstruction and other lower urinary tract symptoms (LUTS)(600.21) 03/30/2014  . Hypercholesteremia 03/30/2014  . Sleep apnea 03/03/2012  . Benign localized prostatic hyperplasia with lower urinary tract symptoms (LUTS) 10/16/2011  . Elevated prostate specific antigen (PSA) 10/16/2011  . BPH (benign prostatic hyperplasia) 10/16/2011  . Incomplete emptying of bladder 10/16/2011  . Frequency of micturition 10/16/2011  . Retention of urine 10/16/2011  . Asthma with COPD (HCC) 05/31/2011  . Chronic obstructive asthma (HCC) 05/31/2011  . House dust mite allergy 10/20/2010  . Other allergy, other than to medicinal agents 10/20/2010    Past Surgical History: Past Surgical History:  Procedure Laterality Date  . COLONOSCOPY    . ESOPHAGOGASTRODUODENOSCOPY (EGD) WITH PROPOFOL N/A 12/20/2019   Procedure: ESOPHAGOGASTRODUODENOSCOPY (EGD) WITH PROPOFOL;  Surgeon: Midge Minium, MD;  Location: [facility] ENDOSCOPY;  Service: Endoscopy;  Laterality: N/A;  . IVC FILTER INSERTION N/A 11/26/2019   Procedure: IVC FILTER INSERTION;  Surgeon: Renford Dills, MD;  Location: [facility] INVASIVE CV LAB;  Service: Cardiovascular;  Laterality: N/A;  . JOINT REPLACEMENT    . KNEE ARTHROPLASTY Left 12/08/2019   Procedure: COMPUTER ASSISTED TOTAL KNEE ARTHROPLASTY;  Surgeon: Donato Heinz, MD;  Location: [facility] ORS;  Service: Orthopedics;  Laterality: Left;  . KNEE ARTHROPLASTY Right 05/01/2020  Procedure: COMPUTER ASSISTED TOTAL KNEE ARTHROPLASTY;  Surgeon: Donato Heinz, MD;  Location: [facility] ORS;  Service: Orthopedics;  Laterality: Right;  . PROSTATE BIOPSY       Allergies: Allergies  Allergen Reactions  . Zileuton Hives    [Onset: 10/27/2009]    . Roflumilast Other (See Comments)    insomnia   [Onset: 11/29/2010] Insomnia    Home Medications: (Not in a hospital admission)  Home medication reconciliation was completed with the patient.   Scheduled Inpatient Medications:   . finasteride  5 mg Oral QHS  . mometasone-formoterol  2 puff Inhalation BID  . montelukast  10 mg Oral QHS  . pantoprazole (PROTONIX) IV  40 mg Intravenous Q12H  . tiotropium  18 mcg Inhalation Daily    Continuous Inpatient Infusions:   . sodium chloride      PRN Inpatient Medications:  acetaminophen **OR** acetaminophen, albuterol, azelastine, ondansetron **OR** ondansetron (ZOFRAN) IV  Family History: family history includes Asthma in his father; Heart attack in his father.   GI Family History: Negative  Social History:   reports that he has been smoking cigarettes. He has been smoking about 0.10 packs per day. He has never used smokeless tobacco. He reports that he does not drink alcohol and does not use drugs. The patient denies ETOH, tobacco, or drug use.    Review of Systems: Review of Systems - General ROS: positive for  - fatigue and weight loss negative for - night sweats or sleep disturbance Psychological ROS: negative Ophthalmic ROS: negative ENT ROS: negative Allergy and Immunology ROS: negative Hematological and Lymphatic ROS: positive for - blood clots negative for - bruising, night sweats or swollen lymph nodes Endocrine ROS: negative Respiratory ROS: positive for - shortness of breath Cardiovascular ROS: negative for - irregular heartbeat, loss of consciousness, palpitations or paroxysmal nocturnal dyspnea Genito-Urinary ROS: no dysuria, trouble voiding, or hematuria Musculoskeletal ROS: positive for - joint stiffness and joint swelling Neurological ROS: no TIA or stroke symptoms Dermatological ROS: negative  Physical  Examination: BP 127/72   Pulse 65   Temp 98.4 F (36.9 C) (Oral)   Resp 15   SpO2 98%  Physical Exam Vitals reviewed.  Constitutional:      Appearance: He is well-developed.  HENT:     Head: Normocephalic and atraumatic.  Eyes:     Extraocular Movements: Extraocular movements intact.     Pupils: Pupils are equal, round, and reactive to light.  Cardiovascular:     Rate and Rhythm: Normal rate.  Pulmonary:     Effort: Pulmonary effort is normal.     Breath sounds: No decreased breath sounds, wheezing or rhonchi.  Chest:     Chest wall: No mass, deformity or crepitus.  Abdominal:     Palpations: Abdomen is soft. There is no splenomegaly.     Tenderness: There is no abdominal tenderness. There is no guarding or rebound.  Musculoskeletal:     Cervical back: Normal range of motion and neck supple.     Right lower leg: Tenderness present. 2+ Edema present.     Left lower leg: No tenderness. No edema.       Legs:     Comments: Postop right knee swelling  Neurological:     Mental Status: He is alert and oriented to person, place, and time. Mental status is at baseline.     Sensory: Sensation is intact.     Motor: Motor function is intact.  Psychiatric:  Attention and Perception: Attention and perception normal.        Mood and Affect: Mood and affect normal.        Speech: Speech normal.        Behavior: Behavior normal.        Thought Content: Thought content normal.        Cognition and Memory: Cognition normal.        Judgment: Judgment normal.     Data: Lab Results  Component Value Date   WBC 4.1 06/07/2020   HGB 6.6 (L) 06/07/2020   HCT 22.7 (L) 06/07/2020   MCV 72.1 (L) 06/07/2020   PLT 161 06/07/2020   Recent Labs  Lab 06/07/20 1116  HGB 6.6*   Lab Results  Component Value Date   NA 134 (L) 06/07/2020   K 4.0 06/07/2020   CL 103 06/07/2020   CO2 25 06/07/2020   BUN 14 06/07/2020   CREATININE 0.74 06/07/2020   Lab Results  Component Value  Date   ALT 20 04/20/2020   AST 24 04/20/2020   ALKPHOS 47 04/20/2020   BILITOT 0.7 04/20/2020   Recent Labs  Lab 06/07/20 1147  INR 1.9*   CBC Latest Ref Rng & Units 06/07/2020 04/20/2020 12/21/2019  WBC 4.0 - 10.5 K/uL 4.1 8.4 11.2(H)  Hemoglobin 13.0 - 17.0 g/dL 6.6(L) 9.5(L) 9.7(L)  Hematocrit 39.0 - 52.0 % 22.7(L) 31.9(L) 29.8(L)  Platelets 150 - 400 K/uL 161 290 288    STUDIES: DG Chest 2 View  Result Date: 06/07/2020 CLINICAL DATA:  Shortness of breath for 4 days EXAM: CHEST - 2 VIEW COMPARISON:  05/06/2017 FINDINGS: Normal heart size. Aortic tortuosity. Linear scarring or atelectasis at the right base. There is no edema, consolidation, effusion, or pneumothorax. IMPRESSION: Mild scarring or atelectasis.  No edema or consolidation. Electronically Signed   By: Marnee Spring M.D.   On: 06/07/2020 11:27   @IMAGES @  Assessment:  1. Anemia secondary to gastrointestinal blood loss. DDx includes PUD, angiodysplasia, nsaid enteropathy (reportedly negative hx of NSAIDs), small bowel or colon neoplasm, resolving diverticular bleed, etc.  2. Heme positive stool. Bleeding is not overt at this time.  3. Anticoagulant therapy - Xarelto  4. Pulmonary embolism and DVT.  5.  S/p Total right knee replacement - Dr. - 05/01/2020.  6. History of small bowel ileus  COVID-19 status: NEGATIVE  Recommendations:  1. Agree with holding anticoagulants if medically feasible.  2. Clear liquid diet.  3. IV acid suppression.  4. Transfusions as deemed necessary.  5. EGD and colonoscopy when clinically feasible, not before 48 hrs given recent intake of Xarelto.  Thank you for the consult. Please call with questions or concerns.  07/01/2020, "Rosina Lowenstein MD [facility] [address] [place], [place] [id number]  06/07/2020 2:09 PM

## 2020-06-07 NOTE — ED Provider Notes (Signed)
[facility] Emergency Department Provider Note  ____________________________________________   Event Date/Time   First MD Initiated Contact with Patient 06/07/20 1116     (approximate)  I have reviewed the triage vital signs and the nursing notes.   HISTORY  Chief Complaint Shortness of Breath   HPI Kyle Wells is a 87 y.o. 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  Patient also had a syncopal episode 4 days ago when he was out walking and after he got back he passed out.  He was accompanied by family and he did not fall or hit his head and he was out for less than a few seconds.  He states that he has shortness of breath whenever he exerts himself at rest.  Denies any chest pain, cough, headache, earache, sore throat, vomiting, diarrhea, dysuria, blood in his stool or urine or any other recent injuries or falls.  States he last took his Xarelto this morning.  No extremity weakness numbness tingling or any other clear acute complaints.         Past Medical History:  Diagnosis Date  . Arthritis   . Asthma   . BPH (benign prostatic hyperplasia)   . COPD (chronic obstructive pulmonary disease) (HCC)   . Hypercholesterolemia   . Impingement syndrome of right shoulder   . OSA (obstructive sleep apnea)   . Small bowel obstruction [facility])     Patient Active Problem List   Diagnosis Date Noted  . Total knee replacement status 05/01/2020  . Anticoagulated   . Anemia 12/20/2019  . Hematemesis 12/20/2019  . Tobacco abuse 12/20/2019  . Gastro-esophageal reflux disease with esophagitis, with bleeding   . Status post total left knee replacement  12/08/2019  . History of pulmonary embolism 06/14/2019  . Multiple subsegmental pulmonary emboli without acute cor pulmonale (HCC) 06/14/2019  . Intractable nausea and vomiting 05/28/2019  . Primary osteoarthritis of both knees 11/03/2017  . SBO (small bowel obstruction) (HCC) 05/07/2017  . Small bowel obstruction (HCC) 12/10/2016  . Healthcare maintenance 12/02/2016  . Nausea   . Obesity, unspecified 09/26/2016  . Obstructive sleep apnea on CPAP 11/02/2015  . Chronic obstructive pulmonary disease (HCC)   . Intestinal obstruction (HCC)   . Ileus (HCC) 09/19/2014  . Impingement syndrome of shoulder, right 05/13/2014  . Benign localized hyperplasia of prostate with urinary obstruction and other lower urinary tract symptoms (LUTS)(600.21) 03/30/2014  . Hypercholesteremia 03/30/2014  . Sleep apnea 03/03/2012  . Benign localized prostatic hyperplasia with lower urinary tract symptoms (LUTS) 10/16/2011  . Elevated prostate specific antigen (PSA) 10/16/2011  . BPH (benign prostatic hyperplasia) 10/16/2011  . Incomplete emptying of bladder 10/16/2011  . Frequency of micturition 10/16/2011  . Retention of urine 10/16/2011  . Asthma with COPD (HCC) 05/31/2011  . Chronic obstructive asthma (HCC) 05/31/2011  . House dust mite allergy 10/20/2010  . Other allergy, other than to medicinal agents 10/20/2010    Past Surgical History:  Procedure Laterality Date  . COLONOSCOPY    . ESOPHAGOGASTRODUODENOSCOPY (EGD) WITH PROPOFOL N/A 12/20/2019   Procedure: ESOPHAGOGASTRODUODENOSCOPY (EGD) WITH PROPOFOL;  Surgeon: Midge Minium, MD;  Location: [facility] ENDOSCOPY;  Service: Endoscopy;  Laterality: N/A;  . IVC  FILTER INSERTION N/A 11/26/2019   Procedure: IVC FILTER INSERTION;  Surgeon: Renford Dills, MD;  Location: [facility] INVASIVE CV LAB;  Service: Cardiovascular;  Laterality: N/A;  . JOINT REPLACEMENT    . KNEE ARTHROPLASTY Left 12/08/2019   Procedure: COMPUTER ASSISTED TOTAL KNEE ARTHROPLASTY;   Surgeon: Donato Heinz, MD;  Location: [facility] ORS;  Service: Orthopedics;  Laterality: Left;  . KNEE ARTHROPLASTY Right 05/01/2020   Procedure: COMPUTER ASSISTED TOTAL KNEE ARTHROPLASTY;  Surgeon: Donato Heinz, MD;  Location: [facility] ORS;  Service: Orthopedics;  Laterality: Right;  . PROSTATE BIOPSY      Prior to Admission medications   Medication Sig Start Date End Date Taking? Authorizing Provider  acetaminophen (TYLENOL) 650 MG CR tablet Take 1,300 mg by mouth 2 (two) times daily.    [provider]  ADVAIR DISKUS 500-50 MCG/DOSE AEPB Inhale 1 puff into the lungs 2 (two) times daily.  09/12/14   [provider]  albuterol (PROVENTIL HFA;VENTOLIN HFA) 108 (90 Base) MCG/ACT inhaler Inhale 2 puffs into the lungs every 4 (four) hours as needed for wheezing or shortness of breath.    [provider]  albuterol (PROVENTIL) (2.5 MG/3ML) 0.083% nebulizer solution Inhale 3 mLs into the lungs every 6 (six) hours as needed for wheezing or shortness of breath.  02/21/14   [provider]  azelastine (ASTELIN) 0.1 % nasal spray Place 2 sprays into both nostrils daily as needed for allergies.  05/21/19   [provider]  docusate sodium (COLACE) 100 MG capsule Take 100 mg by mouth daily in the afternoon.    [provider]  finasteride (PROSCAR) 5 MG tablet Take 1 tablet (5 mg total) by mouth daily. Patient taking differently: Take 5 mg by mouth at bedtime. 09/21/14   Lattie Haw, MD  montelukast (SINGULAIR) 10 MG tablet Take 10 mg by mouth at bedtime.  09/12/14   [provider]  Multiple Vitamin (MULTIVITAMIN) capsule Take 1 capsule by mouth daily.    [provider]  oxyCODONE (OXY IR/ROXICODONE) 5 MG immediate release tablet Take 1 tablet (5 mg total) by mouth every 4 (four) hours as needed for moderate pain (pain score 4-6). 05/02/20   Lasandra Beech B, PA-C  pantoprazole (PROTONIX) 40 MG tablet Take 1 tablet (40 mg total) by mouth 2  (two) times daily. 12/21/19 01/20/20  Delfino Lovett, MD  polyethylene glycol (MIRALAX / GLYCOLAX) packet Take 17 g by mouth daily. Patient taking differently: Take 17 g by mouth at bedtime. 05/09/17   Katha Hamming, MD  rivaroxaban (XARELTO) 20 MG TABS tablet Take 1 tablet (20 mg total) by mouth daily. 12/27/19   Delfino Lovett, MD  SPIRIVA HANDIHALER 18 MCG inhalation capsule Place 18 mcg into inhaler and inhale daily. 09/13/14   [provider]  traZODone (DESYREL) 50 MG tablet Take 50 mg by mouth at bedtime. 02/08/20   [provider]    Allergies Zileuton and Roflumilast  Family History  Problem Relation Age of Onset  . Asthma Father   . Heart attack Father     Social History Social History   Tobacco Use  . Smoking status: Current Some Day Smoker    Packs/day: 0.10    Types: Cigarettes  . Smokeless tobacco: Never Used  Vaping Use  . Vaping Use: Never used  Substance Use Topics  . Alcohol use: No  . Drug use: No    Review of Systems  Review of Systems  Constitutional: Positive for malaise/fatigue. Negative  for chills and fever.  HENT: Negative for sore throat.   Eyes: Negative for pain.  Respiratory: Positive for shortness of breath. Negative for cough and stridor.   Cardiovascular: Negative for chest pain.  Gastrointestinal: Negative for vomiting.  Genitourinary: Negative for dysuria.  Musculoskeletal: Negative for myalgias.  Skin: Negative for rash.  Neurological: Positive for loss of consciousness. Negative for seizures and headaches.  Psychiatric/Behavioral: Negative for suicidal ideas.  All other systems reviewed and are negative.     ____________________________________________   PHYSICAL EXAM:  VITAL SIGNS: ED Triage Vitals [06/07/20 1048]  Enc Vitals Group     BP 133/66     Pulse Rate 71     Resp 18     Temp 97.8 F (36.6 C)     Temp Source Oral     SpO2 97 %     Weight      Height      Head Circumference      Peak Flow       Pain Score 0     Pain Loc      Pain Edu?      Excl. in GC?    Vitals:   06/07/20 1048 06/07/20 1200  BP: 133/66 (!) 115/57  Pulse: 71 67  Resp: 18 14  Temp: 97.8 F (36.6 C)   SpO2: 97% 100%   Physical Exam Vitals and nursing note reviewed. Exam conducted with a chaperone present.  Constitutional:      Appearance: He is well-developed.  HENT:     Head: Normocephalic and atraumatic.     Right Ear: External ear normal.     Left Ear: External ear normal.     Nose: Nose normal.  Eyes:     Conjunctiva/sclera: Conjunctivae normal.  Cardiovascular:     Rate and Rhythm: Normal rate and regular rhythm.     Heart sounds: No murmur heard.   Pulmonary:     Effort: Pulmonary effort is normal. No respiratory distress.     Breath sounds: Normal breath sounds.  Abdominal:     Palpations: Abdomen is soft.     Tenderness: There is no abdominal tenderness.  Genitourinary:    Rectum: Guaiac result positive.     Comments: Small amount of bright red blood mixed with brown stool.  No obvious source of bleeding or active bleeding on exam. Musculoskeletal:     Cervical back: Neck supple.  Skin:    General: Skin is warm and dry.     Capillary Refill: Capillary refill takes less than 2 seconds.  Neurological:     Mental Status: He is alert and oriented to person, place, and time.  Psychiatric:        Mood and Affect: Mood normal.      ____________________________________________   LABS (all labs ordered are listed, but only abnormal results are displayed)  Labs Reviewed  CBC - Abnormal; Notable for the following components:      Result Value   RBC 3.15 (*)    Hemoglobin 6.6 (*)    HCT 22.7 (*)    MCV 72.1 (*)    MCH 21.0 (*)    MCHC 29.1 (*)    RDW 19.7 (*)    All other components within normal limits  BASIC METABOLIC PANEL - Abnormal; Notable for the following components:   Sodium 134 (*)    All other components within normal limits  PROTIME-INR - Abnormal; Notable  for the following components:   Prothrombin Time 21.6 (*)  INR 1.9 (*)    All other components within normal limits  RESP PANEL BY RT-PCR (FLU A&B, COVID) ARPGX2  TYPE AND SCREEN  PREPARE RBC (CROSSMATCH)  TROPONIN I (HIGH SENSITIVITY)   ____________________________________________  EKG  Sinus rhythm with a ventricular of 69, normal axis, unremarkable intervals without clearance of acute ischemia or other significant underlying arrhythmia. ____________________________________________  RADIOLOGY  ED MD interpretation: Chest x-ray has no full consolidation, effusion, significant edema, pneumothorax or any other clear acute thoracic process.  Official radiology report(s): DG Chest 2 View  Result Date: 06/07/2020 CLINICAL DATA:  Shortness of breath for 4 days EXAM: CHEST - 2 VIEW COMPARISON:  05/06/2017 FINDINGS: Normal heart size. Aortic tortuosity. Linear scarring or atelectasis at the right base. There is no edema, consolidation, effusion, or pneumothorax. IMPRESSION: Mild scarring or atelectasis.  No edema or consolidation. Electronically Signed   By: Marnee SpringJonathon  Watts M.D.   On: 06/07/2020 11:27    ____________________________________________   PROCEDURES  Procedure(s) performed (including Critical Care):  .Critical Care Performed by: Gilles ChiquitoSmith, Judie Hollick P, MD Authorized by: Gilles ChiquitoSmith, Linsey Arteaga P, MD   Critical care provider statement:    Critical care time (minutes):  45   Critical care was necessary to treat or prevent imminent or life-threatening deterioration of the following conditions:  Circulatory failure   Critical care was time spent personally by me on the following activities:  Discussions with consultants, evaluation of patient's response to treatment, examination of patient, ordering and performing treatments and interventions, ordering and review of laboratory studies, ordering and review of radiographic studies, pulse oximetry, re-evaluation of patient's condition,  obtaining history from patient or surrogate and review of old charts     ____________________________________________   INITIAL IMPRESSION / ASSESSMENT AND PLAN / ED COURSE      Patient presents with above-stated history and exam for assessment of dyspnea with exertion and syncopal episode several days ago and low hemoglobin noted on outpatient labs earlier today.  On arrival he is afebrile and hemodynamically stable.  He is Hemoccult positive and there is a little bit of gross blood visible on rectal exam without any active bleeding.  Hemoglobin today is 6.6 compared to 9.5 a month ago.  I suspect this is the cause of patient's dyspnea on exertion syncopal episode a couple of days ago.  He denies any injury or trauma from that episode and did not hit his head or fall.  Low suspicion for significant visceral or intracranial injury at this point.  ECG shows no evidence of arrhythmia he denies any chest pain and given nonelevated troponin I have a low suspicion for ACS arrhythmia causing patient's syncope or fatigue.  BMP shows no significant electrolyte or metabolic derangements.  Suspect source is likely lower GI bleed.  I will plan to transfuse patient 1 unit of PRBCs and admit to medicine service for further evaluation and management.      ____________________________________________   FINAL CLINICAL IMPRESSION(S) / ED DIAGNOSES  Final diagnoses:  Gastrointestinal hemorrhage, unspecified gastrointestinal hemorrhage type  Symptomatic anemia  Anticoagulated    Medications  0.9 %  sodium chloride infusion (has no administration in time range)  acetaminophen (TYLENOL) tablet 1,000 mg (has no administration in time range)     ED Discharge Orders    None       Note:  This document was prepared using Dragon voice recognition software and may include unintentional dictation errors.   Gilles ChiquitoSmith, Rei Medlen P, MD 06/07/20 1231

## 2020-06-07 NOTE — ED Triage Notes (Signed)
First Nurse Note:  Arrives to ED for 'blood transfusion'.  Patient had blood work drawn yesterday.  Per Care everywhere, H/H 6.4/ 22.8.  AAOx3,  Skin pale, warm and dry. No SOB, DOE.

## 2020-06-07 NOTE — ED Notes (Signed)
Pt requesting Tylenol from recent knee surgery.

## 2020-06-08 ENCOUNTER — Encounter: Payer: Self-pay | Admitting: Internal Medicine

## 2020-06-08 DIAGNOSIS — G4700 Insomnia, unspecified: Secondary | ICD-10-CM | POA: Diagnosis not present

## 2020-06-08 DIAGNOSIS — N400 Enlarged prostate without lower urinary tract symptoms: Secondary | ICD-10-CM | POA: Diagnosis not present

## 2020-06-08 DIAGNOSIS — D62 Acute posthemorrhagic anemia: Secondary | ICD-10-CM | POA: Diagnosis not present

## 2020-06-08 DIAGNOSIS — K449 Diaphragmatic hernia without obstruction or gangrene: Secondary | ICD-10-CM | POA: Diagnosis not present

## 2020-06-08 DIAGNOSIS — D696 Thrombocytopenia, unspecified: Secondary | ICD-10-CM | POA: Diagnosis not present

## 2020-06-08 DIAGNOSIS — D649 Anemia, unspecified: Secondary | ICD-10-CM | POA: Diagnosis not present

## 2020-06-08 DIAGNOSIS — G4733 Obstructive sleep apnea (adult) (pediatric): Secondary | ICD-10-CM | POA: Diagnosis not present

## 2020-06-08 DIAGNOSIS — Z8719 Personal history of other diseases of the digestive system: Secondary | ICD-10-CM | POA: Diagnosis not present

## 2020-06-08 DIAGNOSIS — K635 Polyp of colon: Secondary | ICD-10-CM | POA: Diagnosis not present

## 2020-06-08 DIAGNOSIS — K2970 Gastritis, unspecified, without bleeding: Secondary | ICD-10-CM | POA: Diagnosis not present

## 2020-06-08 DIAGNOSIS — F17210 Nicotine dependence, cigarettes, uncomplicated: Secondary | ICD-10-CM | POA: Diagnosis not present

## 2020-06-08 DIAGNOSIS — K621 Rectal polyp: Secondary | ICD-10-CM | POA: Diagnosis not present

## 2020-06-08 DIAGNOSIS — Z20822 Contact with and (suspected) exposure to covid-19: Secondary | ICD-10-CM | POA: Diagnosis not present

## 2020-06-08 DIAGNOSIS — K922 Gastrointestinal hemorrhage, unspecified: Secondary | ICD-10-CM | POA: Diagnosis not present

## 2020-06-08 DIAGNOSIS — R0602 Shortness of breath: Secondary | ICD-10-CM | POA: Diagnosis present

## 2020-06-08 DIAGNOSIS — E785 Hyperlipidemia, unspecified: Secondary | ICD-10-CM | POA: Diagnosis not present

## 2020-06-08 DIAGNOSIS — Z886 Allergy status to analgesic agent status: Secondary | ICD-10-CM | POA: Diagnosis not present

## 2020-06-08 DIAGNOSIS — Z86718 Personal history of other venous thrombosis and embolism: Secondary | ICD-10-CM | POA: Diagnosis not present

## 2020-06-08 DIAGNOSIS — E7800 Pure hypercholesterolemia, unspecified: Secondary | ICD-10-CM | POA: Diagnosis not present

## 2020-06-08 DIAGNOSIS — Z825 Family history of asthma and other chronic lower respiratory diseases: Secondary | ICD-10-CM | POA: Diagnosis not present

## 2020-06-08 DIAGNOSIS — Z86711 Personal history of pulmonary embolism: Secondary | ICD-10-CM | POA: Diagnosis not present

## 2020-06-08 DIAGNOSIS — J449 Chronic obstructive pulmonary disease, unspecified: Secondary | ICD-10-CM | POA: Diagnosis not present

## 2020-06-08 DIAGNOSIS — R791 Abnormal coagulation profile: Secondary | ICD-10-CM | POA: Diagnosis not present

## 2020-06-08 DIAGNOSIS — Z66 Do not resuscitate: Secondary | ICD-10-CM | POA: Diagnosis not present

## 2020-06-08 DIAGNOSIS — K641 Second degree hemorrhoids: Secondary | ICD-10-CM | POA: Diagnosis not present

## 2020-06-08 DIAGNOSIS — Z96653 Presence of artificial knee joint, bilateral: Secondary | ICD-10-CM | POA: Diagnosis not present

## 2020-06-08 DIAGNOSIS — K5730 Diverticulosis of large intestine without perforation or abscess without bleeding: Secondary | ICD-10-CM | POA: Diagnosis not present

## 2020-06-08 LAB — CBC
HCT: 23.5 % — ABNORMAL LOW (ref 39.0–52.0)
Hemoglobin: 7.1 g/dL — ABNORMAL LOW (ref 13.0–17.0)
MCH: 22 pg — ABNORMAL LOW (ref 26.0–34.0)
MCHC: 30.2 g/dL (ref 30.0–36.0)
MCV: 73 fL — ABNORMAL LOW (ref 80.0–100.0)
Platelets: 145 10*3/uL — ABNORMAL LOW (ref 150–400)
RBC: 3.22 MIL/uL — ABNORMAL LOW (ref 4.22–5.81)
RDW: 19 % — ABNORMAL HIGH (ref 11.5–15.5)
WBC: 5.8 10*3/uL (ref 4.0–10.5)
nRBC: 0 % (ref 0.0–0.2)

## 2020-06-08 LAB — BASIC METABOLIC PANEL
Anion gap: 6 (ref 5–15)
BUN: 10 mg/dL (ref 8–23)
CO2: 24 mmol/L (ref 22–32)
Calcium: 8.6 mg/dL — ABNORMAL LOW (ref 8.9–10.3)
Chloride: 106 mmol/L (ref 98–111)
Creatinine, Ser: 0.71 mg/dL (ref 0.61–1.24)
GFR, Estimated: >60 mL/min (ref >60)
Glucose, Bld: 86 mg/dL (ref 70–99)
Potassium: 3.8 mmol/L (ref 3.5–5.1)
Sodium: 136 mmol/L (ref 135–145)

## 2020-06-08 LAB — HEMOGLOBIN AND HEMATOCRIT, BLOOD
HCT: 26.7 % — ABNORMAL LOW (ref 39.0–52.0)
Hemoglobin: 8.2 g/dL — ABNORMAL LOW (ref 13.0–17.0)

## 2020-06-08 LAB — PROTIME-INR
INR: 1.4 — ABNORMAL HIGH (ref 0.8–1.2)
Prothrombin Time: 17.6 seconds — ABNORMAL HIGH (ref 11.4–15.2)

## 2020-06-08 LAB — PREPARE RBC (CROSSMATCH)

## 2020-06-08 LAB — MAGNESIUM: Magnesium: 2 mg/dL (ref 1.7–2.4)

## 2020-06-08 MED ORDER — TRAZODONE HCL 100 MG PO TABS
100.0000 mg | ORAL_TABLET | Freq: Every day | ORAL | Status: DC
  Administered 2020-06-08: 100 mg via ORAL
  Filled 2020-06-08: qty 1

## 2020-06-08 MED ORDER — MELATONIN 5 MG PO TABS
5.0000 mg | ORAL_TABLET | Freq: Every day | ORAL | Status: DC
  Filled 2020-06-08: qty 1

## 2020-06-08 MED ORDER — BISACODYL 5 MG PO TBEC
20.0000 mg | DELAYED_RELEASE_TABLET | Freq: Once | ORAL | Status: AC
  Administered 2020-06-08: 20 mg via ORAL
  Filled 2020-06-08: qty 4

## 2020-06-08 MED ORDER — SODIUM CHLORIDE 0.9% IV SOLUTION: Freq: Once | INTRAVENOUS | Status: AC

## 2020-06-08 MED ORDER — POLYETHYLENE GLYCOL 3350 17 GM/SCOOP PO POWD
1.0000 | Freq: Once | ORAL | Status: AC
  Administered 2020-06-08: 255 g via ORAL
  Filled 2020-06-08 (×2): qty 255

## 2020-06-08 NOTE — Evaluation (Signed)
Physical Therapy Evaluation Patient Details Name: Kyle Wells MRN: [id number] DOB: November 20, 1933 Today's Date: 06/08/2020   History of Present Illness  Kyle Wells is a 87 y.o. 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  He also had an episode of syncope 4 days back while he was walking but did not hit his head or had trauma this lasted for few seconds.  Currently, he has been having shortness of breath whenever he exerts.  Denies any cough, fever, chills or rigor.  Denies sick contacts no congestion, runny nose or sore throat.  Patient has been taking Xarelto for his history of DVT/PE and has a history of IVC filter.    Clinical Impression  Pt received in Semi-Fowler's position and agreeable to therapy.  Pt is to be getting blood transfusion later this PM, but nursing cleared pt to be seen by therapist.  Pt was able to perform all bed mobility with modI and no difficulty performing.  Pt notes he is to get blood transfusion and that is causing him to be somewhat SOB, but feels fine throughout session.  Pt was able to circumambulate around the nursing station with good speed, only slowing down for therapist to navigate the IV pole.  Pt does have RR increase to 34, however O2 sats remained >96%.  Pt reports he is functionally at baseline level and will no longer need PT for knee following d/c from hospital.  Pt agreeable for d/c at this time, however if any change to status occurs, please send new orders for PT.  Patient is at baseline, all education completed, and time is given to address all questions/concerns. No additional skilled PT services needed at this time, PT signing off. PT recommends daily ambulation ad lib or with nursing staff as needed to prevent deconditioning.       Follow Up Recommendations No PT follow up    Equipment Recommendations       Recommendations for Other Services       Precautions / Restrictions Precautions Precautions: None Restrictions Weight Bearing Restrictions: No      Mobility  Bed Mobility Overal bed mobility: Modified Independent                  Transfers Overall transfer level: Modified independent Equipment used: Rolling walker (2 wheeled)                Ambulation/Gait   Gait Distance (Feet): 160 Feet Assistive device: Rolling walker (2 wheeled) Gait Pattern/deviations: WFL(Within Functional Limits) Gait velocity: WNL   General Gait Details: Pt able to navigate hallways with good speed and was slowed down only by therapist and IV pole navigation.  Stairs            Wheelchair Mobility    Modified Rankin (Stroke Patients Only)       Balance Overall balance assessment: Modified Independent                                           Pertinent Vitals/Pain Pain Assessment: No/denies pain    Home Living Family/patient expects to be discharged to:: Private residence Living Arrangements: Alone Available Help at Discharge: Family;Available 24 hours/day Type of Home:  House Home Access: Level entry     Home Layout: One level Home Equipment: Walker - 2 wheels;Cane - single point;Shower seat;Electric scooter;Bedside commode      Prior Function Level of Independence: Independent with assistive device(s)         Comments: Patient reports using rolling walker when he first gets up, then transitions to [facility] "after my first cup of coffee." Pt uses [facility] in the community. Patient completes ADLs independently and drives.     Hand Dominance   Dominant Hand: Right    Extremity/Trunk Assessment   Upper Extremity Assessment Upper Extremity Assessment: Overall WFL for tasks assessed    Lower Extremity Assessment Lower Extremity Assessment: Overall WFL for  tasks assessed       Communication   Communication: No difficulties  Cognition Arousal/Alertness: Awake/alert Behavior During Therapy: WFL for tasks assessed/performed Overall Cognitive Status: Within Functional Limits for tasks assessed                                        General Comments      Exercises     Assessment/Plan    PT Assessment Patent does not need any further PT services  PT Problem List         PT Treatment Interventions      PT Goals (Current goals can be found in the Care Plan section)  Acute Rehab PT Goals Patient Stated Goal: I want to go home PT Goal Formulation: With patient Time For Goal Achievement: 06/22/20 Potential to Achieve Goals: Good    Frequency     Barriers to discharge        Co-evaluation               AM-PAC PT "6 Clicks" Mobility  Outcome Measure Help needed turning from your back to your side while in a flat bed without using bedrails?: None Help needed moving from lying on your back to sitting on the side of a flat bed without using bedrails?: None Help needed moving to and from a bed to a chair (including a wheelchair)?: None Help needed standing up from a chair using your arms (e.g., wheelchair or bedside chair)?: None Help needed to walk in hospital room?: None Help needed climbing 3-5 steps with a railing? : A Little 6 Click Score: 23    End of Session Equipment Utilized During Treatment: Gait belt Activity Tolerance: Patient tolerated treatment well Patient left: in bed;with nursing/sitter in room Nurse Communication: Mobility status      Time: 0962-8366 PT Time Calculation (min) (ACUTE ONLY): 18 min   Charges:   PT Evaluation $PT Eval Low Complexity: 1 Low PT Treatments $Gait Training: 8-22 mins        Nolon Bussing, PT, DPT 06/08/20, 3:04 PM   Phineas Real 06/08/2020, 3:00 PM

## 2020-06-08 NOTE — Progress Notes (Signed)
Mobility Specialist - Progress Note   06/08/20 1500  Mobility  Activity Contraindicated/medical hold  Mobility performed by Mobility specialist    Per discussion with nurse, pt receiving blood transfusion at this time, contraindicating mobility. Will attempt session at another date/time as appropriate.    Filiberto Pinks Mobility Specialist 06/08/20, 3:15 PM

## 2020-06-08 NOTE — Progress Notes (Addendum)
Progress Note    Kyle Wells  EXH:[id number] DOB: 26-Oct-1933  DOA: 06/07/2020 PCP: Lauro Regulus, MD      Brief Narrative:    Medical records reviewed and are as summarized below:  Kyle Wells is a 87 y.o. 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  He presented to the hospital because of exertional shortness of breath.  He also reported a transient syncopal episode that occurred about 4 days prior to admission.  He was found to have positive heme stools and acute symptomatic anemia on chronic anemia.    Assessment/Plan:   Principal Problem:   Symptomatic anemia Active Problems:   Asthma with COPD (HCC)   Sleep apnea   History of pulmonary embolism   Status post total left knee replacement   GI bleed  Acute symptomatic anemia on chronic anemia: S/p transfusion 1 unit of PRBCs on 06/07/2020.  Transfuse another unit of PRBCs today.  Monitor H&H.  Occult GI bleeding/positive heme stools: Xarelto has been held.  Plan for endoscopic work-up in 1 to 2 days.  Follow-up with gastroenterologist.  Discontinue IV fluids later today.  Mild acute thrombocytopenia: Follow-up platelet count.  History of DVT and PE (May 2021), s/p IVC filter: Xarelto has been held.  History of asthma, COPD and obstructive sleep apnea: Continue bronchodilators and CPAP at night.   Diet Order            Diet clear liquid Room service appropriate? Yes; Fluid consistency: Thin  Diet effective now                    Consultants:  Gastroenterologist  Procedures:  None    Medications:   . sodium chloride   Intravenous Once  . acetaminophen  1,000 mg Oral BID  . docusate sodium  100 mg Oral Q1500  . finasteride  5 mg Oral QHS  . melatonin  5 mg Oral QHS  . mometasone-formoterol  2 puff Inhalation BID  .  montelukast  10 mg Oral QHS  . pantoprazole (PROTONIX) IV  40 mg Intravenous Q12H  . polyethylene glycol  17 g Oral QHS  . tiotropium  18 mcg Inhalation Daily  . traZODone  100 mg Oral QHS   Continuous Infusions: . sodium chloride 100 mL/hr at 06/08/20 0933     Anti-infectives (From admission, onward)   None             Family Communication/Anticipated D/C date and plan/Code Status   DVT prophylaxis: SCDs Start: 06/07/20 1330     Code Status: DNR  Family Communication: None Disposition Plan:    Status is: Inpatient  Remains inpatient appropriate because:Ongoing diagnostic testing needed not appropriate for outpatient work up   Dispo: The patient is from: Home              Anticipated d/c is to: Home              Patient currently is not medically stable to d/c.   Difficult to place patient No           Subjective:   Interval events noted.  He does not report any bloody or black stools.  He said he has not noticed any such thing.  No vomiting or abdominal pain. He feels weak.  No shortness of breath or  chest pain.  Objective:    Vitals:   06/07/20 1631 06/07/20 2151 06/08/20 0401 06/08/20 0949  BP: 130/74 122/72 128/67 129/75  Pulse: 68 69 85 84  Resp: 18 20 20 18   Temp: 98.1 F (36.7 C) 97.7 F (36.5 C) 98.3 F (36.8 C) 98.3 F (36.8 C)  TempSrc: Oral Oral Oral   SpO2: 97% 95% 93% 97%   No data found.   Intake/Output Summary (Last 24 hours) at 06/08/2020 1155 Last data filed at 06/08/2020 1015 Gross per 24 hour  Intake 4114.39 ml  Output 2300 ml  Net 1814.39 ml   There were no vitals filed for this visit.  Exam:  GEN: NAD SKIN: No rash EYES: EOMI ENT: MMM CV: RRR PULM: CTA B ABD: soft, ND, NT, +BS CNS: AAO x 3, non focal EXT: No edema or tenderness        Data Reviewed:   I have personally reviewed following labs and imaging studies:  Labs: Labs show the following:   Basic Metabolic Panel: Recent Labs  Lab  06/07/20 1116 06/08/20 0349  NA 134* 136  K 4.0 3.8  CL 103 106  CO2 25 24  GLUCOSE 94 86  BUN 14 10  CREATININE 0.74 0.71  CALCIUM 9.1 8.6*  MG  --  2.0   GFR CrCl cannot be calculated (Unknown ideal weight.). Liver Function Tests: No results for input(s): AST, ALT, ALKPHOS, BILITOT, PROT, ALBUMIN in the last 168 hours. No results for input(s): LIPASE, AMYLASE in the last 168 hours. No results for input(s): AMMONIA in the last 168 hours. Coagulation profile Recent Labs  Lab 06/07/20 1147 06/08/20 0349  INR 1.9* 1.4*    CBC: Recent Labs  Lab 06/07/20 1116 06/08/20 0349  WBC 4.1 5.8  HGB 6.6* 7.1*  HCT 22.7* 23.5*  MCV 72.1* 73.0*  PLT 161 145*   Cardiac Enzymes: No results for input(s): CKTOTAL, CKMB, CKMBINDEX, TROPONINI in the last 168 hours. BNP (last 3 results) No results for input(s): PROBNP in the last 8760 hours. CBG: No results for input(s): GLUCAP in the last 168 hours. D-Dimer: No results for input(s): DDIMER in the last 72 hours. Hgb A1c: No results for input(s): HGBA1C in the last 72 hours. Lipid Profile: No results for input(s): CHOL, HDL, LDLCALC, TRIG, CHOLHDL, LDLDIRECT in the last 72 hours. Thyroid function studies: No results for input(s): TSH, T4TOTAL, T3FREE, THYROIDAB in the last 72 hours.  Invalid input(s): FREET3 Anemia work up: No results for input(s): VITAMINB12, FOLATE, FERRITIN, TIBC, IRON, RETICCTPCT in the last 72 hours. Sepsis Labs: Recent Labs  Lab 06/07/20 1116 06/08/20 0349  WBC 4.1 5.8    Microbiology Recent Results (from the past 240 hour(s))  Resp Panel by RT-PCR (Flu A&B, Covid) Nasopharyngeal Swab     Status: None   Collection Time: 06/07/20 11:47 AM   Specimen: Nasopharyngeal Swab; Nasopharyngeal(NP) swabs in vial transport medium  Result Value Ref Range Status   SARS Coronavirus 2 by RT PCR NEGATIVE NEGATIVE Final    Comment: (NOTE) SARS-CoV-2 target nucleic acids are NOT DETECTED.  The SARS-CoV-2 RNA is  generally detectable in upper respiratory specimens during the acute phase of infection. The lowest concentration of SARS-CoV-2 viral copies this assay can detect is 138 copies/mL. A negative result does not preclude SARS-Cov-2 infection and should not be used as the sole basis for treatment or other patient management decisions. A negative result may occur with  improper specimen collection/handling, submission of specimen other than nasopharyngeal swab,  presence of viral mutation(s) within the areas targeted by this assay, and inadequate number of viral copies(<138 copies/mL). A negative result must be combined with clinical observations, patient history, and epidemiological information. The expected result is Negative.  Fact Sheet for Patients:  BloggerCourse.com  Fact Sheet for Healthcare Providers:  SeriousBroker.it  This test is no t yet approved or cleared by the [place] FDA and  has been authorized for detection and/or diagnosis of SARS-CoV-2 by FDA under an Emergency Use Authorization (EUA). This EUA will remain  in effect (meaning this test can be used) for the duration of the COVID-19 declaration under Section 564(b)(1) of the Act, 21 U.S.C.section 360bbb-3(b)(1), unless the authorization is terminated  or revoked sooner.       Influenza A by PCR NEGATIVE NEGATIVE Final   Influenza B by PCR NEGATIVE NEGATIVE Final    Comment: (NOTE) The Xpert Xpress SARS-CoV-2/FLU/RSV plus assay is intended as an aid in the diagnosis of influenza from Nasopharyngeal swab specimens and should not be used as a sole basis for treatment. Nasal washings and aspirates are unacceptable for Xpert Xpress SARS-CoV-2/FLU/RSV testing.  Fact Sheet for Patients: BloggerCourse.com  Fact Sheet for Healthcare Providers: SeriousBroker.it  This test is not yet approved or cleared by the [place] FDA and has been authorized for detection and/or diagnosis of SARS-CoV-2 by FDA under an Emergency Use Authorization (EUA). This EUA will remain in effect (meaning this test can be used) for the duration of the COVID-19 declaration under Section 564(b)(1) of the Act, 21 U.S.C. section 360bbb-3(b)(1), unless the authorization is terminated or revoked.  Performed at [facility], [address] [place], [place] [address]     Procedures and diagnostic studies:  DG Chest 2 View  Result Date: 06/07/2020 CLINICAL DATA:  Shortness of breath for 4 days EXAM: CHEST - 2 VIEW COMPARISON:  05/06/2017 FINDINGS: Normal heart size. Aortic tortuosity. Linear scarring or atelectasis at the right base. There is no edema, consolidation, effusion, or pneumothorax. IMPRESSION: Mild scarring or atelectasis.  No edema or consolidation. Electronically Signed   By: Marnee Spring M.D.   On: 06/07/2020 11:27               LOS: 0 days   Kiyomi Pallo  [facility]   Pager on [email or web address]. If 7PM-7AM, please contact night-coverage at [email or web address]     06/08/2020, 11:55 AM

## 2020-06-08 NOTE — Progress Notes (Signed)
[facility] Gastroenterology Inpatient Progress Note  Subjective: Patient seen for f/u gi blood loss anemia, painless hematochezia. Appears to be doing well from a GI standpoint without c/o  abdominal pain, further rectal bleeding.  Objective: Vital signs in last 24 hours: Temp:  [97.7 F (36.5 C)-98.4 F (36.9 C)] 98.4 F (36.9 C) (05/12 1413) Pulse Rate:  [68-85] 76 (05/12 1413) Resp:  [18-20] 18 (05/12 0949) BP: (122-130)/(67-76) 126/76 (05/12 1413) SpO2:  [93 %-97 %] 97 % (05/12 1413) Blood pressure 126/76, pulse 76, temperature 98.4 F (36.9 C), temperature source Oral, resp. rate 18, SpO2 97 %.    Intake/Output from previous day: 05/11 0701 - 05/12 0700 In: 3334.4 [P.O.:1380; I.V.:1174.4; Blood:780] Out: 1900 [Urine:1900]  Intake/Output this shift: Total I/O In: 1560 [P.O.:1560] Out: 400 [Urine:400]   Gen: NAD. Appears comfortable.  HEENT: [place]/AT. PERRLA. Normal external ear exam.  Chest: CTA, no wheezes.  CV: RR nl S1, S2. No gallops.  Abd: soft, nt, nd. BS+  Ext: no edema. Pulses 2+  Neuro: Alert and oriented. Judgement appears normal. Nonfocal.   Lab Results: Results for orders placed or performed during the hospital encounter of 06/07/20 (from the past 24 hour(s))  Troponin I (High Sensitivity)     Status: None   Collection Time: 06/07/20  6:09 PM  Result Value Ref Range   Troponin I (High Sensitivity) 3 <18 ng/L  Basic metabolic panel     Status: Abnormal   Collection Time: 06/08/20  3:49 AM  Result Value Ref Range   Sodium 136 135 - 145 mmol/L   Potassium 3.8 3.5 - 5.1 mmol/L   Chloride 106 98 - 111 mmol/L   CO2 24 22 - 32 mmol/L   Glucose, Bld 86 70 - 99 mg/dL   BUN 10 8 - 23 mg/dL   Creatinine, Ser 0.16 0.61 - 1.24 mg/dL   Calcium 8.6 (L) 8.9 - 10.3 mg/dL   GFR, Estimated >01 >09 mL/min   Anion gap 6 5 - 15  CBC     Status: Abnormal   Collection Time: 06/08/20  3:49 AM  Result Value Ref Range   WBC 5.8 4.0 - 10.5 K/uL   RBC 3.22 (L)  4.22 - 5.81 MIL/uL   Hemoglobin 7.1 (L) 13.0 - 17.0 g/dL   HCT 32.3 (L) 55.7 - 32.2 %   MCV 73.0 (L) 80.0 - 100.0 fL   MCH 22.0 (L) 26.0 - 34.0 pg   MCHC 30.2 30.0 - 36.0 g/dL   RDW 02.5 (H) 42.7 - 06.2 %   Platelets 145 (L) 150 - 400 K/uL   nRBC 0.0 0.0 - 0.2 %  Protime-INR     Status: Abnormal   Collection Time: 06/08/20  3:49 AM  Result Value Ref Range   Prothrombin Time 17.6 (H) 11.4 - 15.2 seconds   INR 1.4 (H) 0.8 - 1.2  Magnesium     Status: None   Collection Time: 06/08/20  3:49 AM  Result Value Ref Range   Magnesium 2.0 1.7 - 2.4 mg/dL  Prepare RBC (crossmatch)     Status: None   Collection Time: 06/08/20  9:00 AM  Result Value Ref Range   Order Confirmation      ORDER PROCESSED BY BLOOD BANK Performed at [facility], [address] [place], [place] [address]      Recent Labs    06/07/20 1116 06/08/20 0349  WBC 4.1 5.8  HGB 6.6* 7.1*  HCT 22.7* 23.5*  PLT 161 145*  BMET Recent Labs    06/07/20 1116 06/08/20 0349  NA 134* 136  K 4.0 3.8  CL 103 106  CO2 25 24  GLUCOSE 94 86  BUN 14 10  CREATININE 0.74 0.71  CALCIUM 9.1 8.6*   LFT No results for input(s): PROT, ALBUMIN, AST, ALT, ALKPHOS, BILITOT, BILIDIR, IBILI in the last 72 hours. PT/INR Recent Labs    06/07/20 1147 06/08/20 0349  LABPROT 21.6* 17.6*  INR 1.9* 1.4*   Hepatitis Panel No results for input(s): HEPBSAG, HCVAB, HEPAIGM, HEPBIGM in the last 72 hours. C-Diff No results for input(s): CDIFFTOX in the last 72 hours. No results for input(s): CDIFFPCR in the last 72 hours.   Studies/Results: DG Chest 2 View  Result Date: 06/07/2020 CLINICAL DATA:  Shortness of breath for 4 days EXAM: CHEST - 2 VIEW COMPARISON:  05/06/2017 FINDINGS: Normal heart size. Aortic tortuosity. Linear scarring or atelectasis at the right base. There is no edema, consolidation, effusion, or pneumothorax. IMPRESSION: Mild scarring or atelectasis.  No edema or consolidation. Electronically Signed    By: Marnee Spring M.D.   On: 06/07/2020 11:27    Scheduled Inpatient Medications:   . sodium chloride   Intravenous Once  . acetaminophen  1,000 mg Oral BID  . docusate sodium  100 mg Oral Q1500  . finasteride  5 mg Oral QHS  . melatonin  5 mg Oral QHS  . mometasone-formoterol  2 puff Inhalation BID  . montelukast  10 mg Oral QHS  . pantoprazole (PROTONIX) IV  40 mg Intravenous Q12H  . tiotropium  18 mcg Inhalation Daily  . traZODone  100 mg Oral QHS    Continuous Inpatient Infusions:   . sodium chloride 100 mL/hr at 06/08/20 0933    PRN Inpatient Medications:  acetaminophen **OR** acetaminophen, albuterol, azelastine, ondansetron **OR** ondansetron (ZOFRAN) IV, oxyCODONE, traMADol   Assessment:  1. Anemia secondary to gastrointestinal blood loss. DDx includes PUD, angiodysplasia, nsaid enteropathy (reportedly negative hx of NSAIDs), small bowel or colon neoplasm, resolving diverticular bleed, etc. Hgb only up to 7.1 after 1uprbc  2. Heme positive stool. Bleeding is not overt at this time.  3. Anticoagulant therapy - Xarelto  4. Pulmonary embolism and DVT.  5.  S/p Total right knee replacement - Dr. Ernest Pine - 05/01/2020.  6. History of small bowel ileus  COVID-19 status:        NEGATIVE  Recommendations:  1. Transfuse another 1-2 u prbc. 2. Continue holding anticoagulation as deemed medically safe/feasible. 3. Continue clear liquid diet. 4. EGD and colonoscopy tomorrow. The patient understands the nature of the planned procedure, indications, risks, alternatives and potential complications including but not limited to bleeding, infection, perforation, damage to internal organs and possible oversedation/side effects from anesthesia. The patient agrees and gives consent to proceed.  Please refer to procedure notes for findings, recommendations and patient disposition/instructions.  Andric Kerce K. Norma Fredrickson, M.D. 06/08/2020, 2:30 PM

## 2020-06-08 NOTE — H&P (View-Only) (Signed)
[facility] Gastroenterology Inpatient Progress Note  Subjective: Patient seen for f/u gi blood loss anemia, painless hematochezia. Appears to be doing well from a GI standpoint without c/o  abdominal pain, further rectal bleeding.  Objective: Vital signs in last 24 hours: Temp:  [97.7 F (36.5 C)-98.4 F (36.9 C)] 98.4 F (36.9 C) (05/12 1413) Pulse Rate:  [68-85] 76 (05/12 1413) Resp:  [18-20] 18 (05/12 0949) BP: (122-130)/(67-76) 126/76 (05/12 1413) SpO2:  [93 %-97 %] 97 % (05/12 1413) Blood pressure 126/76, pulse 76, temperature 98.4 F (36.9 C), temperature source Oral, resp. rate 18, SpO2 97 %.    Intake/Output from previous day: 05/11 0701 - 05/12 0700 In: 3334.4 [P.O.:1380; I.V.:1174.4; Blood:780] Out: 1900 [Urine:1900]  Intake/Output this shift: Total I/O In: 1560 [P.O.:1560] Out: 400 [Urine:400]   Gen: NAD. Appears comfortable.  HEENT: [place]/AT. PERRLA. Normal external ear exam.  Chest: CTA, no wheezes.  CV: RR nl S1, S2. No gallops.  Abd: soft, nt, nd. BS+  Ext: no edema. Pulses 2+  Neuro: Alert and oriented. Judgement appears normal. Nonfocal.   Lab Results: Results for orders placed or performed during the hospital encounter of 06/07/20 (from the past 24 hour(s))  Troponin I (High Sensitivity)     Status: None   Collection Time: 06/07/20  6:09 PM  Result Value Ref Range   Troponin I (High Sensitivity) 3 <18 ng/L  Basic metabolic panel     Status: Abnormal   Collection Time: 06/08/20  3:49 AM  Result Value Ref Range   Sodium 136 135 - 145 mmol/L   Potassium 3.8 3.5 - 5.1 mmol/L   Chloride 106 98 - 111 mmol/L   CO2 24 22 - 32 mmol/L   Glucose, Bld 86 70 - 99 mg/dL   BUN 10 8 - 23 mg/dL   Creatinine, Ser 0.71 0.61 - 1.24 mg/dL   Calcium 8.6 (L) 8.9 - 10.3 mg/dL   GFR, Estimated >60 >60 mL/min   Anion gap 6 5 - 15  CBC     Status: Abnormal   Collection Time: 06/08/20  3:49 AM  Result Value Ref Range   WBC 5.8 4.0 - 10.5 K/uL   RBC 3.22 (L)  4.22 - 5.81 MIL/uL   Hemoglobin 7.1 (L) 13.0 - 17.0 g/dL   HCT 23.5 (L) 39.0 - 52.0 %   MCV 73.0 (L) 80.0 - 100.0 fL   MCH 22.0 (L) 26.0 - 34.0 pg   MCHC 30.2 30.0 - 36.0 g/dL   RDW 19.0 (H) 11.5 - 15.5 %   Platelets 145 (L) 150 - 400 K/uL   nRBC 0.0 0.0 - 0.2 %  Protime-INR     Status: Abnormal   Collection Time: 06/08/20  3:49 AM  Result Value Ref Range   Prothrombin Time 17.6 (H) 11.4 - 15.2 seconds   INR 1.4 (H) 0.8 - 1.2  Magnesium     Status: None   Collection Time: 06/08/20  3:49 AM  Result Value Ref Range   Magnesium 2.0 1.7 - 2.4 mg/dL  Prepare RBC (crossmatch)     Status: None   Collection Time: 06/08/20  9:00 AM  Result Value Ref Range   Order Confirmation      ORDER PROCESSED BY BLOOD BANK Performed at [facility], [address] [place], [place] [address]      Recent Labs    06/07/20 1116 06/08/20 0349  WBC 4.1 5.8  HGB 6.6* 7.1*  HCT 22.7* 23.5*  PLT 161 145*     BMET Recent Labs    06/07/20 1116 06/08/20 0349  NA 134* 136  K 4.0 3.8  CL 103 106  CO2 25 24  GLUCOSE 94 86  BUN 14 10  CREATININE 0.74 0.71  CALCIUM 9.1 8.6*   LFT No results for input(s): PROT, ALBUMIN, AST, ALT, ALKPHOS, BILITOT, BILIDIR, IBILI in the last 72 hours. PT/INR Recent Labs    06/07/20 1147 06/08/20 0349  LABPROT 21.6* 17.6*  INR 1.9* 1.4*   Hepatitis Panel No results for input(s): HEPBSAG, HCVAB, HEPAIGM, HEPBIGM in the last 72 hours. C-Diff No results for input(s): CDIFFTOX in the last 72 hours. No results for input(s): CDIFFPCR in the last 72 hours.   Studies/Results: DG Chest 2 View  Result Date: 06/07/2020 CLINICAL DATA:  Shortness of breath for 4 days EXAM: CHEST - 2 VIEW COMPARISON:  05/06/2017 FINDINGS: Normal heart size. Aortic tortuosity. Linear scarring or atelectasis at the right base. There is no edema, consolidation, effusion, or pneumothorax. IMPRESSION: Mild scarring or atelectasis.  No edema or consolidation. Electronically Signed    By: Marnee Spring M.D.   On: 06/07/2020 11:27    Scheduled Inpatient Medications:   . sodium chloride   Intravenous Once  . acetaminophen  1,000 mg Oral BID  . docusate sodium  100 mg Oral Q1500  . finasteride  5 mg Oral QHS  . melatonin  5 mg Oral QHS  . mometasone-formoterol  2 puff Inhalation BID  . montelukast  10 mg Oral QHS  . pantoprazole (PROTONIX) IV  40 mg Intravenous Q12H  . tiotropium  18 mcg Inhalation Daily  . traZODone  100 mg Oral QHS    Continuous Inpatient Infusions:   . sodium chloride 100 mL/hr at 06/08/20 0933    PRN Inpatient Medications:  acetaminophen **OR** acetaminophen, albuterol, azelastine, ondansetron **OR** ondansetron (ZOFRAN) IV, oxyCODONE, traMADol   Assessment:  1. Anemia secondary to gastrointestinal blood loss. DDx includes PUD, angiodysplasia, nsaid enteropathy (reportedly negative hx of NSAIDs), small bowel or colon neoplasm, resolving diverticular bleed, etc. Hgb only up to 7.1 after 1uprbc  2. Heme positive stool. Bleeding is not overt at this time.  3. Anticoagulant therapy - Xarelto  4. Pulmonary embolism and DVT.  5.  S/p Total right knee replacement - Dr. Ernest Pine - 05/01/2020.  6. History of small bowel ileus  COVID-19 status:        NEGATIVE  Recommendations:  1. Transfuse another 1-2 u prbc. 2. Continue holding anticoagulation as deemed medically safe/feasible. 3. Continue clear liquid diet. 4. EGD and colonoscopy tomorrow. The patient understands the nature of the planned procedure, indications, risks, alternatives and potential complications including but not limited to bleeding, infection, perforation, damage to internal organs and possible oversedation/side effects from anesthesia. The patient agrees and gives consent to proceed.  Please refer to procedure notes for findings, recommendations and patient disposition/instructions.  Diesel Lina K. Norma Fredrickson, M.D. 06/08/2020, 2:30 PM

## 2020-06-09 ENCOUNTER — Inpatient Hospital Stay: Payer: Medicare Other | Admitting: Anesthesiology

## 2020-06-09 ENCOUNTER — Encounter: Payer: Self-pay | Admitting: Internal Medicine

## 2020-06-09 ENCOUNTER — Encounter
Admission: EM | Disposition: A | Discharge status: Home / Self Care | Payer: Self-pay | Source: Home / Self Care | Attending: Internal Medicine

## 2020-06-09 ENCOUNTER — Other Ambulatory Visit: Payer: Self-pay

## 2020-06-09 DIAGNOSIS — D649 Anemia, unspecified: Secondary | ICD-10-CM | POA: Diagnosis not present

## 2020-06-09 DIAGNOSIS — K922 Gastrointestinal hemorrhage, unspecified: Secondary | ICD-10-CM | POA: Diagnosis not present

## 2020-06-09 DIAGNOSIS — D62 Acute posthemorrhagic anemia: Secondary | ICD-10-CM | POA: Diagnosis not present

## 2020-06-09 DIAGNOSIS — R0602 Shortness of breath: Secondary | ICD-10-CM | POA: Diagnosis not present

## 2020-06-09 HISTORY — PX: COLONOSCOPY WITH PROPOFOL: SHX5780

## 2020-06-09 HISTORY — PX: ESOPHAGOGASTRODUODENOSCOPY: SHX5428

## 2020-06-09 LAB — CBC WITH DIFFERENTIAL/PLATELET
Abs Immature Granulocytes: 0.02 10*3/uL (ref 0.00–0.07)
Basophils Absolute: 0 10*3/uL (ref 0.0–0.1)
Basophils Relative: 0 %
Eosinophils Absolute: 0.5 10*3/uL (ref 0.0–0.5)
Eosinophils Relative: 10 %
HCT: 26.1 % — ABNORMAL LOW (ref 39.0–52.0)
Hemoglobin: 7.9 g/dL — ABNORMAL LOW (ref 13.0–17.0)
Immature Granulocytes: 0 %
Lymphocytes Relative: 31 %
Lymphs Abs: 1.7 10*3/uL (ref 0.7–4.0)
MCH: 22.6 pg — ABNORMAL LOW (ref 26.0–34.0)
MCHC: 30.3 g/dL (ref 30.0–36.0)
MCV: 74.6 fL — ABNORMAL LOW (ref 80.0–100.0)
Monocytes Absolute: 0.7 10*3/uL (ref 0.1–1.0)
Monocytes Relative: 12 %
Neutro Abs: 2.5 10*3/uL (ref 1.7–7.7)
Neutrophils Relative %: 47 %
Platelets: 155 10*3/uL (ref 150–400)
RBC: 3.5 MIL/uL — ABNORMAL LOW (ref 4.22–5.81)
RDW: 19.3 % — ABNORMAL HIGH (ref 11.5–15.5)
WBC: 5.3 10*3/uL (ref 4.0–10.5)
nRBC: 0.4 % — ABNORMAL HIGH (ref 0.0–0.2)

## 2020-06-09 LAB — BPAM RBC
Blood Product Expiration Date: 202205132359
Blood Product Expiration Date: 202206082359
ISSUE DATE / TIME: 202205111301
ISSUE DATE / TIME: 202205121428
Unit Type and Rh: 5100
Unit Type and Rh: 9500

## 2020-06-09 LAB — TYPE AND SCREEN
ABO/RH(D): O POS
Antibody Screen: NEGATIVE
Unit division: 0
Unit division: 0

## 2020-06-09 LAB — IRON AND TIBC
Iron: 31 ug/dL — ABNORMAL LOW (ref 45–182)
Saturation Ratios: 8 % — ABNORMAL LOW (ref 17.9–39.5)
TIBC: 391 ug/dL (ref 250–450)
UIBC: 360 ug/dL

## 2020-06-09 LAB — FERRITIN: Ferritin: 21 ng/mL — ABNORMAL LOW (ref 24–336)

## 2020-06-09 SURGERY — COLONOSCOPY WITH PROPOFOL
Anesthesia: General

## 2020-06-09 MED ORDER — SODIUM CHLORIDE 0.9 % IV SOLN: INTRAVENOUS | Status: DC

## 2020-06-09 MED ORDER — FERROUS SULFATE 325 (65 FE) MG PO TABS: 325.0000 mg | ORAL_TABLET | ORAL | 0 refills | Status: AC

## 2020-06-09 MED ORDER — PROPOFOL 500 MG/50ML IV EMUL
INTRAVENOUS | Status: DC | PRN
  Administered 2020-06-09: 150 ug/kg/min via INTRAVENOUS

## 2020-06-09 MED ORDER — PHENYLEPHRINE HCL (PRESSORS) 10 MG/ML IV SOLN
INTRAVENOUS | Status: DC | PRN
  Administered 2020-06-09 (×3): 200 ug via INTRAVENOUS
  Administered 2020-06-09: 100 ug via INTRAVENOUS

## 2020-06-09 MED ORDER — FINASTERIDE 5 MG PO TABS: 5.0000 mg | ORAL_TABLET | Freq: Every day | ORAL | Status: AC

## 2020-06-09 MED ORDER — PROPOFOL 10 MG/ML IV BOLUS
INTRAVENOUS | Status: DC | PRN
  Administered 2020-06-09: 70 mg via INTRAVENOUS
  Administered 2020-06-09 (×3): 10 mg via INTRAVENOUS

## 2020-06-09 MED ORDER — POLYETHYLENE GLYCOL 3350 17 G PO PACK: 17.0000 g | PACK | Freq: Every day | ORAL | Status: AC

## 2020-06-09 MED ORDER — LIDOCAINE HCL (CARDIAC) PF 100 MG/5ML IV SOSY
PREFILLED_SYRINGE | INTRAVENOUS | Status: DC | PRN
  Administered 2020-06-09: 40 mg via INTRAVENOUS

## 2020-06-09 NOTE — Transfer of Care (Signed)
Immediate Anesthesia Transfer of Care Note  Patient: JAKEVION ARNEY  Procedure(s) Performed: COLONOSCOPY WITH PROPOFOL (N/A ) ESOPHAGOGASTRODUODENOSCOPY (EGD) (N/A )  Patient Location: PACU and Endoscopy Unit  Anesthesia Type:General  Level of Consciousness: drowsy  Airway & Oxygen Therapy: Patient Spontanous Breathing  Post-op Assessment: Report given to RN and Post -op Vital signs reviewed and stable  Post vital signs: Reviewed and stable  Last Vitals:  Vitals Value Taken Time  BP 91/49 06/09/20 1353  Temp    Pulse 71 06/09/20 1353  Resp 17 06/09/20 1353  SpO2 96 % 06/09/20 1353  Vitals shown include unvalidated device data.  Last Pain:  Vitals:   06/09/20 1252  TempSrc: Temporal  PainSc: 0-No pain         Complications: No complications documented.

## 2020-06-09 NOTE — Anesthesia Procedure Notes (Signed)
Date/Time: 06/09/2020 1:07 PM Performed by: Stormy Fabian, CRNA Pre-anesthesia Checklist: Patient identified, Emergency Drugs available, Suction available and Patient being monitored Patient Re-evaluated:Patient Re-evaluated prior to induction Oxygen Delivery Method: Nasal cannula Induction Type: IV induction Dental Injury: Teeth and Oropharynx as per pre-operative assessment  Comments: Nasal cannula with etCO2 monitoring

## 2020-06-09 NOTE — Op Note (Signed)
[facility] Patient Name: Kyle Wells Procedure Date: 06/09/2020 1:06 PM MRN: [id number] Account #: [id number] Date of Birth: 01-15-1934 Admit Type: Outpatient Age: 87 Room: [facility] ENDO ROOM 4 Gender: Male Note Status: Finalized Procedure:             Colonoscopy Indications:           Hematochezia, Acute post hemorrhagic anemia Providers:             Boykin Nearing. Farrah Skoda MD, MD Medicines:             Propofol per Anesthesia Complications:         No immediate complications. Procedure:             Pre-Anesthesia Assessment:                        - The risks and benefits of the procedure and the                         sedation options and risks were discussed with the                         patient. All questions were answered and informed                         consent was obtained.                        - Patient identification and proposed procedure were                         verified prior to the procedure by the nurse. The                         procedure was verified in the procedure room.                        - ASA Grade Assessment: III - A patient with severe                         systemic disease.                        - After reviewing the risks and benefits, the patient                         was deemed in satisfactory condition to undergo the                         procedure.                        After obtaining informed consent, the colonoscope was                         passed under direct vision. Throughout the procedure,                         the patient's blood pressure, pulse, and oxygen  saturations were monitored continuously. The                         Colonoscope was introduced through the anus and                         advanced to the the cecum, identified by appendiceal                         orifice and ileocecal valve. The colonoscopy was                         performed  without difficulty. The patient tolerated                         the procedure well. The quality of the bowel                         preparation was good. The ileocecal valve, appendiceal                         orifice, and rectum were photographed. Findings:      The perianal and digital rectal examinations were normal. Pertinent       negatives include normal sphincter tone and no palpable rectal lesions.      Non-bleeding internal hemorrhoids were found during retroflexion. The       hemorrhoids were Grade II (internal hemorrhoids that prolapse but reduce       spontaneously).      Many small and large-mouthed diverticula were found in the left colon.      A 8 mm polyp was found in the hepatic flexure. The polyp was sessile.       The polyp was removed with a hot snare. Polyp resection was incomplete,       and the resected tissue was not retrieved. To prevent bleeding after the       polypectomy, two hemostatic clips were successfully placed (MR       conditional). There was no bleeding during, or at the end, of the       procedure.      A 18 mm polyp was found in the rectum. The polyp was semi-pedunculated.       The polyp was removed with a piecemeal technique using a hot snare.       Resection and retrieval were complete. To prevent bleeding after the       polypectomy, one hemostatic clip was successfully placed (MR       conditional). There was no bleeding during, or at the end, of the       procedure.      The exam was otherwise without abnormality. Impression:            - Non-bleeding internal hemorrhoids.                        - Diverticulosis in the left colon.                        - One 8 mm polyp at the hepatic flexure, removed with  a hot snare. Incomplete resection. Resected tissue not                         retrieved. Clips (MR conditional) were placed.                        - One 18 mm polyp in the rectum, removed piecemeal                          using a hot snare. Resected and retrieved. Clip (MR                         conditional) was placed.                        - The examination was otherwise normal. Recommendation:        - Return patient to hospital ward for possible                         discharge same day.                        - Resume Eliquis (apixaban) at prior dose tomorrow.                         Refer to managing physician for further adjustment of                         therapy.                        - Await pathology results. Procedure Code(s):     --- Professional ---                        [phone], Colonoscopy, flexible; with removal of                         tumor(s), polyp(s), or other lesion(s) by snare                         technique Diagnosis Code(s):     --- Professional ---                        K57.30, Diverticulosis of large intestine without                         perforation or abscess without bleeding                        D62, Acute posthemorrhagic anemia                        K92.1, Melena (includes Hematochezia)                        K62.1, Rectal polyp                        K63.5, Polyp of colon  K64.1, Second degree hemorrhoids CPT copyright 2019 American Medical Association. All rights reserved. The codes documented in this report are preliminary and upon coder review may  be revised to meet current compliance requirements. [name] MD, MD 06/09/2020 1:52:00 PM This report has been signed electronically. Number of Addenda: 0 Note Initiated On: 06/09/2020 1:06 PM Scope Withdrawal Time: 0 hours 22 minutes 7 seconds  Total Procedure Duration: 0 hours 24 minutes 29 seconds  Estimated Blood Loss:  Estimated blood loss: none.      [facility]

## 2020-06-09 NOTE — Progress Notes (Signed)
Physician Discharge Summary  Kyle MunroJames W Pianka ZOX:[id number] DOB: 1933-12-03 DOA: 06/07/2020  PCP: Lauro RegulusAnderson, Marshall W, MD  Admit date: 06/07/2020 Discharge date: 06/09/2020  Discharge disposition: Home   Recommendations for Outpatient Follow-Up:   Follow-up with PCP in 1 week  Discharge Diagnosis:   Principal Problem:   Symptomatic anemia Active Problems:   Asthma with COPD (HCC)   Sleep apnea   History of pulmonary embolism   Status post total left knee replacement   GI bleed    Discharge Condition: Stable.  Diet recommendation:  Diet Order            Diet heart healthy/carb modified Room service appropriate? Yes; Fluid consistency: Thin  Diet effective now           Diet - low sodium heart healthy                   Code Status: DNR     Hospital Course:   Mr.Sequoia Lacretia NicksW Lin GivensJeffries is a 87 y.o. male with past medical history of asthma, BPH, COPD, hyperlipidemia, obstructive sleep apnea, history of DVT and pulmonary embolism on Xarelto and IVC filter, history of recent knee replacement on 05/01/2020.  He presented to the hospital because of exertional shortness of breath.  He also reported a transient syncopal episode that occurred about 4 days prior to admission.  He was found to have positive heme stools and acute symptomatic anemia on chronic anemia.  He was treated with IV Protonix and IV fluids.  He was transfused with 2 units of packed red blood cells.  He was evaluated by the gastroenterologist and he underwent EGD and colonoscopy.  No source of bleeding was found.  EGD showed 2 cm hiatal hernia and gastritis.  Colonoscopy showed nonbleeding internal hemorrhoids, diverticulosis 8 mm polyp at the hepatic flexure that was removed and an 18 mm polyp in the rectum that was removed.  Posttransfusion hemoglobin has remained stable.  He will be discharged home ferrous sulfate.  His condition has improved and he is deemed stable for discharge to home today.  Discharge plan  was discussed with the patient and his daughter, Annice PihJackie.    Medical Consultants:    Gastroenterologist   Discharge Exam:    Vitals:   06/09/20 1252 06/09/20 1353 06/09/20 1403 06/09/20 1442  BP: 121/71 (!) 91/49 (!) 108/57 121/76  Pulse: 74 72 67 65  Resp: 18 18 20 18   Temp: 98.2 F (36.8 C)   (!) 97.5 F (36.4 C)  TempSrc: Temporal   Oral  SpO2: 97% 97% 97% 94%  Weight: 91.6 kg     Height: 5\' 8"  (1.727 m)        GEN: NAD SKIN: Warm and dry EYES: EOMI ENT: MMM CV: RRR PULM: CTA B ABD: soft, ND, NT, +BS CNS: AAO x 3, non focal EXT: No edema or tenderness   The results of significant diagnostics from this hospitalization (including imaging, microbiology, ancillary and laboratory) are listed below for reference.     Procedures and Diagnostic Studies:   DG Chest 2 View  Result Date: 06/07/2020 CLINICAL DATA:  Shortness of breath for 4 days EXAM: CHEST - 2 VIEW COMPARISON:  05/06/2017 FINDINGS: Normal heart size. Aortic tortuosity. Linear scarring or atelectasis at the right base. There is no edema, consolidation, effusion, or pneumothorax. IMPRESSION: Mild scarring or atelectasis.  No edema or consolidation. Electronically Signed   By: Marnee SpringJonathon  Watts M.D.   On: 06/07/2020 11:27  Labs:   Basic Metabolic Panel: Recent Labs  Lab 06/07/20 1116 06/08/20 0349  NA 134* 136  K 4.0 3.8  CL 103 106  CO2 25 24  GLUCOSE 94 86  BUN 14 10  CREATININE 0.74 0.71  CALCIUM 9.1 8.6*  MG  --  2.0   GFR Estimated Creatinine Clearance: 75.5 mL/min (by C-G formula based on SCr of 0.71 mg/dL). Liver Function Tests: No results for input(s): AST, ALT, ALKPHOS, BILITOT, PROT, ALBUMIN in the last 168 hours. No results for input(s): LIPASE, AMYLASE in the last 168 hours. No results for input(s): AMMONIA in the last 168 hours. Coagulation profile Recent Labs  Lab 06/07/20 1147 06/08/20 0349  INR 1.9* 1.4*    CBC: Recent Labs  Lab 06/07/20 1116 06/08/20 0349  06/08/20 2011 06/09/20 0457  WBC 4.1 5.8  --  5.3  NEUTROABS  --   --   --  2.5  HGB 6.6* 7.1* 8.2* 7.9*  HCT 22.7* 23.5* 26.7* 26.1*  MCV 72.1* 73.0*  --  74.6*  PLT 161 145*  --  155   Cardiac Enzymes: No results for input(s): CKTOTAL, CKMB, CKMBINDEX, TROPONINI in the last 168 hours. BNP: Invalid input(s): POCBNP CBG: No results for input(s): GLUCAP in the last 168 hours. D-Dimer No results for input(s): DDIMER in the last 72 hours. Hgb A1c No results for input(s): HGBA1C in the last 72 hours. Lipid Profile No results for input(s): CHOL, HDL, LDLCALC, TRIG, CHOLHDL, LDLDIRECT in the last 72 hours. Thyroid function studies No results for input(s): TSH, T4TOTAL, T3FREE, THYROIDAB in the last 72 hours.  Invalid input(s): FREET3 Anemia work up No results for input(s): VITAMINB12, FOLATE, FERRITIN, TIBC, IRON, RETICCTPCT in the last 72 hours. Microbiology Recent Results (from the past 240 hour(s))  Resp Panel by RT-PCR (Flu A&B, Covid) Nasopharyngeal Swab     Status: None   Collection Time: 06/07/20 11:47 AM   Specimen: Nasopharyngeal Swab; Nasopharyngeal(NP) swabs in vial transport medium  Result Value Ref Range Status   SARS Coronavirus 2 by RT PCR NEGATIVE NEGATIVE Final    Comment: (NOTE) SARS-CoV-2 target nucleic acids are NOT DETECTED.  The SARS-CoV-2 RNA is generally detectable in upper respiratory specimens during the acute phase of infection. The lowest concentration of SARS-CoV-2 viral copies this assay can detect is 138 copies/mL. A negative result does not preclude SARS-Cov-2 infection and should not be used as the sole basis for treatment or other patient management decisions. A negative result may occur with  improper specimen collection/handling, submission of specimen other than nasopharyngeal swab, presence of viral mutation(s) within the areas targeted by this assay, and inadequate number of viral copies(<138 copies/mL). A negative result must be  combined with clinical observations, patient history, and epidemiological information. The expected result is Negative.  Fact Sheet for Patients:  BloggerCourse.com  Fact Sheet for Healthcare Providers:  SeriousBroker.it  This test is no t yet approved or cleared by the [place] FDA and  has been authorized for detection and/or diagnosis of SARS-CoV-2 by FDA under an Emergency Use Authorization (EUA). This EUA will remain  in effect (meaning this test can be used) for the duration of the COVID-19 declaration under Section 564(b)(1) of the Act, 21 U.S.C.section 360bbb-3(b)(1), unless the authorization is terminated  or revoked sooner.       Influenza A by PCR NEGATIVE NEGATIVE Final   Influenza B by PCR NEGATIVE NEGATIVE Final    Comment: (NOTE) The Xpert Xpress SARS-CoV-2/FLU/RSV plus assay is intended  as an aid in the diagnosis of influenza from Nasopharyngeal swab specimens and should not be used as a sole basis for treatment. Nasal washings and aspirates are unacceptable for Xpert Xpress SARS-CoV-2/FLU/RSV testing.  Fact Sheet for Patients: BloggerCourse.com  Fact Sheet for Healthcare Providers: SeriousBroker.it  This test is not yet approved or cleared by the [place] FDA and has been authorized for detection and/or diagnosis of SARS-CoV-2 by FDA under an Emergency Use Authorization (EUA). This EUA will remain in effect (meaning this test can be used) for the duration of the COVID-19 declaration under Section 564(b)(1) of the Act, 21 U.S.C. section 360bbb-3(b)(1), unless the authorization is terminated or revoked.  Performed at [facility], [address] [place], [place] [address]      Discharge Instructions:   Discharge Instructions    Diet - low sodium heart healthy   Complete by: As directed    Discharge instructions   Complete by: As  directed    Resume Xarelto tomorrow   Increase activity slowly   Complete by: As directed      Allergies as of 06/09/2020      Reactions   Zileuton Hives   [Onset: 10/27/2009]   Roflumilast Other (See Comments)   insomnia   [Onset: 11/29/2010] Insomnia      Medication List    STOP taking these medications   oxyCODONE 5 MG immediate release tablet Commonly known as: Oxy IR/ROXICODONE     TAKE these medications   acetaminophen 650 MG CR tablet Commonly known as: TYLENOL Take 1,300 mg by mouth 2 (two) times daily.   Advair Diskus 500-50 MCG/ACT Aepb Generic drug: fluticasone-salmeterol Inhale 1 puff into the lungs 2 (two) times daily.   albuterol 108 (90 Base) MCG/ACT inhaler Commonly known as: VENTOLIN HFA Inhale 2 puffs into the lungs every 4 (four) hours as needed for wheezing or shortness of breath.   albuterol (2.5 MG/3ML) 0.083% nebulizer solution Commonly known as: PROVENTIL Inhale 3 mLs into the lungs every 6 (six) hours as needed for wheezing or shortness of breath.   azelastine 0.1 % nasal spray Commonly known as: ASTELIN Place 2 sprays into both nostrils daily as needed for allergies.   docusate sodium 100 MG capsule Commonly known as: COLACE Take 100 mg by mouth daily in the afternoon.   ferrous sulfate 325 (65 FE) MG tablet Take 1 tablet (325 mg total) by mouth every other day.   finasteride 5 MG tablet Commonly known as: PROSCAR Take 1 tablet (5 mg total) by mouth at bedtime.   montelukast 10 MG tablet Commonly known as: SINGULAIR Take 10 mg by mouth at bedtime.   multivitamin capsule Take 1 capsule by mouth daily.   omeprazole 20 MG capsule Commonly known as: PRILOSEC Take 20 mg by mouth daily.   pantoprazole 40 MG tablet Commonly known as: Protonix Take 1 tablet (40 mg total) by mouth 2 (two) times daily.   polyethylene glycol 17 g packet Commonly known as: MIRALAX / GLYCOLAX Take 17 g by mouth at bedtime.   rivaroxaban 20 MG Tabs  tablet Commonly known as: Xarelto Take 1 tablet (20 mg total) by mouth daily.   Spiriva HandiHaler 18 MCG inhalation capsule Generic drug: tiotropium Place 18 mcg into inhaler and inhale daily.   traMADol 50 MG tablet Commonly known as: ULTRAM Take 50 mg by mouth every 4 (four) hours as needed.   traZODone 50 MG tablet Commonly known as: DESYREL Take 50 mg by mouth at bedtime.  Time coordinating discharge: 33 minutes  Signed:  Quintrell Baze  [facility] 06/09/2020, 3:37 PM   Pager on [email or web address]. If 7PM-7AM, please contact night-coverage at [email or web address]

## 2020-06-09 NOTE — Anesthesia Preprocedure Evaluation (Addendum)
Anesthesia Evaluation  Patient identified by MRN, date of birth, ID band Patient awake    Reviewed: Allergy & Precautions, H&P , NPO status , Patient's Chart, lab work & pertinent test results  History of Anesthesia Complications Negative for: history of anesthetic complications  Airway Mallampati: III  TM Distance: >3 FB Neck ROM: limited    Dental  (+) Chipped, Poor Dentition, Missing, Partial Upper   Pulmonary asthma , sleep apnea, Continuous Positive Airway Pressure Ventilation and Oxygen sleep apnea , COPD,  oxygen dependent, Current Smoker and Patient abstained from smoking.,    Pulmonary exam normal        Cardiovascular Exercise Tolerance: Good (-) anginaNormal cardiovascular exam     Neuro/Psych negative neurological ROS  negative psych ROS   GI/Hepatic Neg liver ROS, GERD  Medicated,  Endo/Other  negative endocrine ROS  Renal/GU      Musculoskeletal  (+) Arthritis ,   Abdominal   Peds  Hematology negative hematology ROS (+) anemia ,   Anesthesia Other Findings Arthritis Asthma BPH COPD Hypercholesterolemia Impingement syndrome of right shoulder OSA Small bowel obstruction IVC FILTER DVT Xarelto Syncope  Reproductive/Obstetrics negative OB ROS                            Anesthesia Physical  Anesthesia Plan  ASA: III  Anesthesia Plan: General   Post-op Pain Management:    Induction: Intravenous  PONV Risk Score and Plan: 2 and Propofol infusion and TIVA  Airway Management Planned: Natural Airway and Nasal Cannula  Additional Equipment:   Intra-op Plan:   Post-operative Plan:   Informed Consent: I have reviewed the patients History and Physical, chart, labs and discussed the procedure including the risks, benefits and alternatives for the proposed anesthesia with the patient or authorized representative who has indicated his/her understanding and acceptance.      Dental Advisory Given  Plan Discussed with: Anesthesiologist, CRNA and Surgeon  Anesthesia Plan Comments: (Patient reports no bleeding problems and no anticoagulant use (patient reports no Xarelto use in over 72 hours) .  Plan for spinal with backup GA  Patient consented for risks of anesthesia including but not limited to:  - adverse reactions to medications - damage to eyes, teeth, lips or other oral mucosa - nerve damage due to positioning  - risk of bleeding, infection and or nerve damage from spinal that could lead to paralysis - risk of headache or failed spinal - damage to teeth, lips or other oral mucosa - sore throat or hoarseness - damage to heart, brain, nerves, lungs, other parts of body or loss of life  Patient voiced understanding.)        Anesthesia Quick Evaluation

## 2020-06-09 NOTE — Plan of Care (Signed)
Patient completed EGD and colonoscopy, tolerated well. Patient for discharge to home with daughter.  Patient alert and oriented, ambulates independently in room.  Discharge instructions, medication changes, and appointment follow-up appointments reviewed and patient and daughter verbalized understnaidng.  PIV removed prior to discharge. Daughter transporting patient home.

## 2020-06-09 NOTE — Progress Notes (Signed)
Progress Note    Kyle Wells  QPY:[id number] DOB: 1933/05/17  DOA: 06/07/2020 PCP: Lauro Regulus, MD      Brief Narrative:    Medical records reviewed and are as summarized below:  Kyle Wells is a 87 y.o. 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  He presented to the hospital because of exertional shortness of breath.  He also reported a transient syncopal episode that occurred about 4 days prior to admission.  He was found to have positive heme stools and acute symptomatic anemia on chronic anemia.    Assessment/Plan:   Principal Problem:   Symptomatic anemia Active Problems:   Asthma with COPD (HCC)   Sleep apnea   History of pulmonary embolism   Status post total left knee replacement   GI bleed  Acute symptomatic anemia on chronic anemia: H&H stable.  S/p transfusion with 1 unit of PRBCs on 05/28/2020 and 1 unitof PRBCs on 06/07/2020.  Continue to monitor CBC.  Occult GI bleeding/positive heme stools: Xarelto has been held.  Plan for EGD and colonoscopy today.  Follow-up with gastroenterologist.   Mild acute thrombocytopenia: Improved.  History of DVT and PE (May 2021), s/p IVC filter: Xarelto has been held.  History of asthma, COPD and obstructive sleep apnea: Continue bronchodilators and CPAP at night.   Diet Order            Diet NPO time specified  Diet effective 0500 tomorrow                    Consultants:  Gastroenterologist  Procedures:  None    Medications:   . docusate sodium  100 mg Oral Q1500  . finasteride  5 mg Oral QHS  . melatonin  5 mg Oral QHS  . mometasone-formoterol  2 puff Inhalation BID  . montelukast  10 mg Oral QHS  . pantoprazole (PROTONIX) IV  40 mg Intravenous Q12H  . tiotropium  18 mcg Inhalation Daily  . traZODone  100 mg Oral  QHS   Continuous Infusions:    Anti-infectives (From admission, onward)   None             Family Communication/Anticipated D/C date and plan/Code Status   DVT prophylaxis: SCDs Start: 06/07/20 1330     Code Status: DNR  Family Communication: None Disposition Plan:    Status is: Inpatient  Remains inpatient appropriate because:Ongoing diagnostic testing needed not appropriate for outpatient work up   Dispo: The patient is from: Home              Anticipated d/c is to: Home              Patient currently is not medically stable to d/c.   Difficult to place patient No           Subjective:   Interval events noted.  He is having diarrhea from colon prep.  Stool is clear now.  Objective:    Vitals:   06/09/20 0001 06/09/20 0337 06/09/20 0841 06/09/20 1121  BP: 134/85 134/85 128/83 (!) 115/52  Pulse: 83 75 76 69  Resp: 20 18 20    Temp: 98.1 F (36.7 C) 97.9 F (36.6 C) 97.9 F (36.6 C) 97.8 F (36.6 C)  TempSrc: Oral Oral Oral   SpO2: 95% 94% 95% 96%  No data found.   Intake/Output Summary (Last 24 hours) at 06/09/2020 1218 Last data filed at 06/08/2020 1854 Gross per 24 hour  Intake 1910.16 ml  Output 525 ml  Net 1385.16 ml   There were no vitals filed for this visit.  Exam:  GEN: NAD SKIN: No rash EYES: EOMI ENT: MMM CV: RRR PULM: CTA B ABD: soft, ND, NT, +BS CNS: AAO x 3, non focal EXT: No edema or tenderness        Data Reviewed:   I have personally reviewed following labs and imaging studies:  Labs: Labs show the following:   Basic Metabolic Panel: Recent Labs  Lab 06/07/20 1116 06/08/20 0349  NA 134* 136  K 4.0 3.8  CL 103 106  CO2 25 24  GLUCOSE 94 86  BUN 14 10  CREATININE 0.74 0.71  CALCIUM 9.1 8.6*  MG  --  2.0   GFR CrCl cannot be calculated (Unknown ideal weight.). Liver Function Tests: No results for input(s): AST, ALT, ALKPHOS, BILITOT, PROT, ALBUMIN in the last 168 hours. No results for  input(s): LIPASE, AMYLASE in the last 168 hours. No results for input(s): AMMONIA in the last 168 hours. Coagulation profile Recent Labs  Lab 06/07/20 1147 06/08/20 0349  INR 1.9* 1.4*    CBC: Recent Labs  Lab 06/07/20 1116 06/08/20 0349 06/08/20 2011 06/09/20 0457  WBC 4.1 5.8  --  5.3  NEUTROABS  --   --   --  2.5  HGB 6.6* 7.1* 8.2* 7.9*  HCT 22.7* 23.5* 26.7* 26.1*  MCV 72.1* 73.0*  --  74.6*  PLT 161 145*  --  155   Cardiac Enzymes: No results for input(s): CKTOTAL, CKMB, CKMBINDEX, TROPONINI in the last 168 hours. BNP (last 3 results) No results for input(s): PROBNP in the last 8760 hours. CBG: No results for input(s): GLUCAP in the last 168 hours. D-Dimer: No results for input(s): DDIMER in the last 72 hours. Hgb A1c: No results for input(s): HGBA1C in the last 72 hours. Lipid Profile: No results for input(s): CHOL, HDL, LDLCALC, TRIG, CHOLHDL, LDLDIRECT in the last 72 hours. Thyroid function studies: No results for input(s): TSH, T4TOTAL, T3FREE, THYROIDAB in the last 72 hours.  Invalid input(s): FREET3 Anemia work up: No results for input(s): VITAMINB12, FOLATE, FERRITIN, TIBC, IRON, RETICCTPCT in the last 72 hours. Sepsis Labs: Recent Labs  Lab 06/07/20 1116 06/08/20 0349 06/09/20 0457  WBC 4.1 5.8 5.3    Microbiology Recent Results (from the past 240 hour(s))  Resp Panel by RT-PCR (Flu A&B, Covid) Nasopharyngeal Swab     Status: None   Collection Time: 06/07/20 11:47 AM   Specimen: Nasopharyngeal Swab; Nasopharyngeal(NP) swabs in vial transport medium  Result Value Ref Range Status   SARS Coronavirus 2 by RT PCR NEGATIVE NEGATIVE Final    Comment: (NOTE) SARS-CoV-2 target nucleic acids are NOT DETECTED.  The SARS-CoV-2 RNA is generally detectable in upper respiratory specimens during the acute phase of infection. The lowest concentration of SARS-CoV-2 viral copies this assay can detect is 138 copies/mL. A negative result does not preclude  SARS-Cov-2 infection and should not be used as the sole basis for treatment or other patient management decisions. A negative result may occur with  improper specimen collection/handling, submission of specimen other than nasopharyngeal swab, presence of viral mutation(s) within the areas targeted by this assay, and inadequate number of viral copies(<138 copies/mL). A negative result must be combined with clinical observations, patient history, and epidemiological information. The  expected result is Negative.  Fact Sheet for Patients:  BloggerCourse.com  Fact Sheet for Healthcare Providers:  SeriousBroker.it  This test is no t yet approved or cleared by the [place] FDA and  has been authorized for detection and/or diagnosis of SARS-CoV-2 by FDA under an Emergency Use Authorization (EUA). This EUA will remain  in effect (meaning this test can be used) for the duration of the COVID-19 declaration under Section 564(b)(1) of the Act, 21 U.S.C.section 360bbb-3(b)(1), unless the authorization is terminated  or revoked sooner.       Influenza A by PCR NEGATIVE NEGATIVE Final   Influenza B by PCR NEGATIVE NEGATIVE Final    Comment: (NOTE) The Xpert Xpress SARS-CoV-2/FLU/RSV plus assay is intended as an aid in the diagnosis of influenza from Nasopharyngeal swab specimens and should not be used as a sole basis for treatment. Nasal washings and aspirates are unacceptable for Xpert Xpress SARS-CoV-2/FLU/RSV testing.  Fact Sheet for Patients: BloggerCourse.com  Fact Sheet for Healthcare Providers: SeriousBroker.it  This test is not yet approved or cleared by the [place] FDA and has been authorized for detection and/or diagnosis of SARS-CoV-2 by FDA under an Emergency Use Authorization (EUA). This EUA will remain in effect (meaning this test can be used) for the duration of  the COVID-19 declaration under Section 564(b)(1) of the Act, 21 U.S.C. section 360bbb-3(b)(1), unless the authorization is terminated or revoked.  Performed at [facility], [address] [place], [place] [address]     Procedures and diagnostic studies:  No results found.             LOS: 1 day   Kellin Bartling  [facility]   Pager on [email or web address]. If 7PM-7AM, please contact night-coverage at [email or web address]     06/09/2020, 12:18 PM

## 2020-06-09 NOTE — Progress Notes (Signed)
Patient finished bowel prep, tolerated well. LBM at 300 was yellow and clear. Albuterol nebulizer given for [facility]. Consent for colonoscopy and EGD signed.

## 2020-06-09 NOTE — Anesthesia Postprocedure Evaluation (Signed)
Anesthesia Post Note  Patient: Kyle Wells  Procedure(s) Performed: COLONOSCOPY WITH PROPOFOL (N/A ) ESOPHAGOGASTRODUODENOSCOPY (EGD) (N/A )  Patient location during evaluation: Phase II Anesthesia Type: General Level of consciousness: awake and alert, awake and oriented Pain management: pain level controlled Vital Signs Assessment: post-procedure vital signs reviewed and stable Respiratory status: spontaneous breathing, nonlabored ventilation and respiratory function stable Cardiovascular status: blood pressure returned to baseline and stable Postop Assessment: no apparent nausea or vomiting Anesthetic complications: no   No complications documented.   Last Vitals:  Vitals:   06/09/20 1353 06/09/20 1403  BP: (!) 91/49 (!) 108/57  Pulse: 72 67  Resp: 18 20  Temp:    SpO2: 97% 97%    Last Pain:  Vitals:   06/09/20 1252  TempSrc: Temporal  PainSc: 0-No pain                 Manfred Arch

## 2020-06-09 NOTE — Interval H&P Note (Signed)
History and Physical Interval Note:  06/09/2020 1:00 PM  Kyle Wells  has presented today for surgery, with the diagnosis of Anemia secondary to gastrointestinal blood loss.  The various methods of treatment have been discussed with the patient and family. After consideration of risks, benefits and other options for treatment, the patient has consented to  Procedure(s): COLONOSCOPY WITH PROPOFOL (N/A) as a surgical intervention.  The patient's history has been reviewed, patient examined, no change in status, stable for surgery.  I have reviewed the patient's chart and labs.  Questions were answered to the patient's satisfaction.     [place], [place]

## 2020-06-09 NOTE — Care Plan (Signed)
MD into talk with patient Reviewed procedure findings See full report in chart Report called to RN  Patient alert and oriented * 4 - stated he understood information

## 2020-06-09 NOTE — Op Note (Signed)
[facility] Patient Name: Kyle Wells Procedure Date: 06/09/2020 1:07 PM MRN: [id number] Account #: [id number] Date of Birth: 1933-02-06 Admit Type: Inpatient Age: 87 Room: 4 Gender: Male Note Status: Finalized Procedure:             Upper GI endoscopy Indications:           Acute post hemorrhagic anemia, Hematochezia Providers:             Boykin Nearing. Arleene Settle MD, MD Medicines:             Propofol per Anesthesia Complications:         No immediate complications. Procedure:             Pre-Anesthesia Assessment:                        - The risks and benefits of the procedure and the                         sedation options and risks were discussed with the                         patient. All questions were answered and informed                         consent was obtained.                        - Patient identification and proposed procedure were                         verified prior to the procedure by the nurse. The                         procedure was verified in the procedure room.                        - ASA Grade Assessment: III - A patient with severe                         systemic disease.                        - After reviewing the risks and benefits, the patient                         was deemed in satisfactory condition to undergo the                         procedure.                        After obtaining informed consent, the endoscope was                         passed under direct vision. Throughout the procedure,                         the patient's blood pressure, pulse, and oxygen  saturations were monitored continuously. The Endoscope                         was introduced through the mouth, and advanced to the                         third part of duodenum. The upper GI endoscopy was                         accomplished without difficulty. The patient tolerated                         the  procedure well. Findings:      The esophagus was normal.      A 2 cm hiatal hernia was present.      Patchy mild inflammation characterized by erosions was found in the       gastric antrum.      The examined duodenum was normal.      The exam was otherwise without abnormality. Impression:            - Normal esophagus.                        - 2 cm hiatal hernia.                        - Gastritis.                        - Normal examined duodenum.                        - The examination was otherwise normal.                        - No specimens collected. Recommendation:        - Proceed with colonoscopy Procedure Code(s):     --- Professional ---                        [phone], Esophagogastroduodenoscopy, flexible,                         transoral; diagnostic, including collection of                         specimen(s) by brushing or washing, when performed                         (separate procedure) Diagnosis Code(s):     --- Professional ---                        K92.1, Melena (includes Hematochezia)                        D62, Acute posthemorrhagic anemia                        K29.70, Gastritis, unspecified, without bleeding                        K44.9, Diaphragmatic hernia without obstruction or  gangrene CPT copyright 2019 American Medical Association. All rights reserved. The codes documented in this report are preliminary and upon coder review may  be revised to meet current compliance requirements. Stanton Kidney MD, MD 06/09/2020 1:19:52 PM This report has been signed electronically. Number of Addenda: 0 Note Initiated On: 06/09/2020 1:07 PM Estimated Blood Loss:  Estimated blood loss: none.      [facility]

## 2020-06-10 NOTE — Discharge Summary (Signed)
Please see discharge summary that was dictated as progress note  on 06/09/2020 at 3:37 PM.

## 2020-06-12 ENCOUNTER — Encounter: Payer: Self-pay | Admitting: Internal Medicine

## 2020-06-13 LAB — SURGICAL PATHOLOGY

## 2020-06-14 ENCOUNTER — Telehealth (INDEPENDENT_AMBULATORY_CARE_PROVIDER_SITE_OTHER): Payer: Self-pay | Admitting: Vascular Surgery

## 2020-06-15 ENCOUNTER — Telehealth (INDEPENDENT_AMBULATORY_CARE_PROVIDER_SITE_OTHER): Payer: Self-pay

## 2020-06-15 NOTE — Telephone Encounter (Signed)
Spoke with the patient and he is scheduled with Dr. Gilda Crease for a IVC filter removal on 06/27/20 with a 12:00 pm arrival time to the MM. Pre-procedure instructions were discussed and will be mailed.

## 2020-06-19 ENCOUNTER — Ambulatory Visit (INDEPENDENT_AMBULATORY_CARE_PROVIDER_SITE_OTHER): Payer: Medicare PPO | Admitting: Vascular Surgery

## 2020-06-19 NOTE — Telephone Encounter (Signed)
Documentation only.

## 2020-06-22 NOTE — Telephone Encounter (Signed)
Patient was called and given the a new arrival time to the MM on 06/27/20 of 2:00 pm for his IVC filer removal with Dr. Gilda Crease.

## 2020-06-27 ENCOUNTER — Encounter
Admission: RE | Disposition: A | Discharge status: Home / Self Care | Payer: Self-pay | Source: Home / Self Care | Attending: Vascular Surgery

## 2020-06-27 ENCOUNTER — Other Ambulatory Visit (INDEPENDENT_AMBULATORY_CARE_PROVIDER_SITE_OTHER): Payer: Self-pay | Admitting: Nurse Practitioner

## 2020-06-27 ENCOUNTER — Ambulatory Visit
Admission: RE | Admit: 2020-06-27 | Discharge: 2020-06-27 | Disposition: A | Discharge status: Home / Self Care | Payer: Medicare PPO | Attending: Vascular Surgery | Admitting: Vascular Surgery

## 2020-06-27 ENCOUNTER — Other Ambulatory Visit: Payer: Self-pay

## 2020-06-27 DIAGNOSIS — Z452 Encounter for adjustment and management of vascular access device: Secondary | ICD-10-CM | POA: Diagnosis present

## 2020-06-27 DIAGNOSIS — K2101 Gastro-esophageal reflux disease with esophagitis, with bleeding: Secondary | ICD-10-CM | POA: Diagnosis not present

## 2020-06-27 DIAGNOSIS — Z87891 Personal history of nicotine dependence: Secondary | ICD-10-CM | POA: Diagnosis not present

## 2020-06-27 DIAGNOSIS — J449 Chronic obstructive pulmonary disease, unspecified: Secondary | ICD-10-CM | POA: Insufficient documentation

## 2020-06-27 DIAGNOSIS — Z86711 Personal history of pulmonary embolism: Secondary | ICD-10-CM | POA: Diagnosis not present

## 2020-06-27 DIAGNOSIS — M170 Bilateral primary osteoarthritis of knee: Secondary | ICD-10-CM | POA: Insufficient documentation

## 2020-06-27 DIAGNOSIS — I82409 Acute embolism and thrombosis of unspecified deep veins of unspecified lower extremity: Secondary | ICD-10-CM

## 2020-06-27 HISTORY — PX: IVC FILTER REMOVAL: CATH118246

## 2020-06-27 SURGERY — IVC FILTER REMOVAL
Anesthesia: Moderate Sedation

## 2020-06-27 MED ORDER — SODIUM CHLORIDE 0.9 % IV SOLN: INTRAVENOUS | Status: DC

## 2020-06-27 MED ORDER — MIDAZOLAM HCL 2 MG/ML PO SYRP: 8.0000 mg | ORAL_SOLUTION | Freq: Once | ORAL | Status: DC | PRN

## 2020-06-27 MED ORDER — FENTANYL CITRATE (PF) 100 MCG/2ML IJ SOLN
INTRAMUSCULAR | Status: DC | PRN
  Administered 2020-06-27: 50 ug via INTRAVENOUS

## 2020-06-27 MED ORDER — MIDAZOLAM HCL 5 MG/5ML IJ SOLN
INTRAMUSCULAR | Status: AC
  Filled 2020-06-27: qty 5

## 2020-06-27 MED ORDER — FAMOTIDINE 20 MG PO TABS: 40.0000 mg | ORAL_TABLET | Freq: Once | ORAL | Status: DC | PRN

## 2020-06-27 MED ORDER — IODIXANOL 320 MG/ML IV SOLN
INTRAVENOUS | Status: DC | PRN
  Administered 2020-06-27: 15 mL via INTRA_ARTERIAL

## 2020-06-27 MED ORDER — FENTANYL CITRATE (PF) 100 MCG/2ML IJ SOLN
INTRAMUSCULAR | Status: AC
  Filled 2020-06-27: qty 2

## 2020-06-27 MED ORDER — CEFAZOLIN SODIUM-DEXTROSE 2-4 GM/100ML-% IV SOLN
INTRAVENOUS | Status: AC
  Filled 2020-06-27: qty 100

## 2020-06-27 MED ORDER — HYDROMORPHONE HCL 1 MG/ML IJ SOLN: 1.0000 mg | Freq: Once | INTRAMUSCULAR | Status: DC | PRN

## 2020-06-27 MED ORDER — METHYLPREDNISOLONE SODIUM SUCC 125 MG IJ SOLR: 125.0000 mg | Freq: Once | INTRAMUSCULAR | Status: DC | PRN

## 2020-06-27 MED ORDER — ONDANSETRON HCL 4 MG/2ML IJ SOLN: 4.0000 mg | Freq: Four times a day (QID) | INTRAMUSCULAR | Status: DC | PRN

## 2020-06-27 MED ORDER — DIPHENHYDRAMINE HCL 50 MG/ML IJ SOLN: 50.0000 mg | Freq: Once | INTRAMUSCULAR | Status: DC | PRN

## 2020-06-27 MED ORDER — CEFAZOLIN SODIUM-DEXTROSE 2-4 GM/100ML-% IV SOLN
2.0000 g | Freq: Once | INTRAVENOUS | Status: AC
  Administered 2020-06-27: 2 g via INTRAVENOUS

## 2020-06-27 MED ORDER — MIDAZOLAM HCL 2 MG/2ML IJ SOLN
INTRAMUSCULAR | Status: DC | PRN
  Administered 2020-06-27: 2 mg via INTRAVENOUS

## 2020-06-27 SURGICAL SUPPLY — 5 items
CANNULA 5F STIFF (CANNULA) ×2 IMPLANT
COVER PROBE U/S 5X48 (MISCELLANEOUS) ×2 IMPLANT
PACK ANGIOGRAPHY (CUSTOM PROCEDURE TRAY) ×2 IMPLANT
SET VENACAVA FILTER RETRIEVAL (MISCELLANEOUS) ×2 IMPLANT
WIRE GUIDERIGHT .035X150 (WIRE) ×2 IMPLANT

## 2020-06-27 NOTE — Interval H&P Note (Signed)
History and Physical Interval Note:  06/27/2020 2:16 PM  Kyle Wells  has presented today for surgery, with the diagnosis of IVC Filter Removal   DVT.  The various methods of treatment have been discussed with the patient and family. After consideration of risks, benefits and other options for treatment, the patient has consented to  Procedure(s): IVC FILTER REMOVAL (N/A) as a surgical intervention.  The patient's history has been reviewed, patient examined, no change in status, stable for surgery.  I have reviewed the patient's chart and labs.  Questions were answered to the patient's satisfaction.     [name]

## 2020-06-27 NOTE — Op Note (Signed)
  OPERATIVE NOTE   PRE-OPERATIVE DIAGNOSIS: History of PE; degenerative joint disease patient is now status post successful total knee replacement  POST-OPERATIVE DIAGNOSIS: Same  PROCEDURE: 1. Retrieval of IVC Filter 2. Inferior Vena Cavagram  SURGEON: Renford Dills, M.D.  ANESTHESIA:  Conscious sedation was administered under my direct supervision by the interventional radiology RN. IV Versed plus fentanyl were utilized. Continuous ECG, pulse oximetry and blood pressure was monitored throughout the entire procedure. Conscious sedation was for a total of 20 minutes and 20 seconds.  ESTIMATED BLOOD LOSS: Minimal cc  FINDING(S):inferior vena cava is widely patent filter is in place in good position. Filter is removed without incident  SPECIMEN(S):  IVC filter intact  CONTRAST:  15 cc  Fluoroscopy time:  1.9 minutes  INDICATIONS:   Kyle Wells is a 87 y.o. male who presents with DVT and PE. The patient has now tolerated anticoagulation for several months. Therefore, the IVC filter is recommended to be removed. The risks and benefits were reviewed with the patient all questions were answered and they agreed to proceed with IVC filter retrieval. Oral anticoagulation will be continued.  DESCRIPTION: After obtaining full informed written consent, the patient was brought back to the Special Procedure Suite and placed in the supine position.  The patient received IV antibiotics prior to induction.  After obtaining adequate sedation, the patient was prepped and draped in the standard fashion and appropriate time out is called.     Ultrasound was placed in a sterile sleeve.The right neck was then imaged with ultrasound.   Jugular vein was identified it is echolucent and homogeneous indicating patency. 1% lidocaine is then infiltrated under ultrasound visualization and subsequently a Seldinger needle is inserted under real-time ultrasound guidance.  J-wire is then advanced into the  inferior vena cava under fluoroscopic guidance. With the tip of the sheath at the confluence of the iliac veins inferior vena caval imaging is performed.  After review of the image the sheath is repositioned to above the filter and the snares introduced. Snares opened and the hook is secured without difficulty. The filter is then collapsed within the sheath and removed without difficulty.  Sheath is removed by pressures held the patient tolerated the procedure well and there were no immediate complications.  Interpretation: inferior vena cava is widely patent filter is in place in good position. Filter is removed without incident.     COMPLICATIONS: None  CONDITION: Kyle Wells, M.D. [facility] Office: [phone]   06/27/2020, 3:34 PM

## 2020-06-27 NOTE — H&P (Signed)
@LOGO @   MRN :  Kyle Wells is a 87 y.o. (1933-07-24) male who presents with chief complaint of No chief complaint on file. 08/22/1933  History of Present Illness:   Patient presents to [facility] status post knee replacement in April 2022 with IVC filter insertion before the surgery secondary to history of PE.  He notes that his rehab has gone well and he is doing quite nicely with his knee replacement.    Initially he was considering whether he would have his other knee done but is opted not to at this point and therefore he has elected to remove his filter. No outpatient medications have been marked as taking for the 06/27/20 encounter [facility] Encounter).    Past Medical History:  Diagnosis Date  . Arthritis   . Asthma   . BPH (benign prostatic hyperplasia)   . COPD (chronic obstructive pulmonary disease) (HCC)   . Hypercholesterolemia   . Impingement syndrome of right shoulder   . OSA (obstructive sleep apnea)   . Small bowel obstruction [facility]     Past Surgical History:  Procedure Laterality Date  . COLONOSCOPY    . COLONOSCOPY WITH PROPOFOL N/A 06/09/2020   Procedure: COLONOSCOPY WITH PROPOFOL;  Surgeon: Toledo, 06/11/2020, MD;  Location: [facility] ENDOSCOPY;  Service: Gastroenterology;  Laterality: N/A;  . ESOPHAGOGASTRODUODENOSCOPY N/A 06/09/2020   Procedure: ESOPHAGOGASTRODUODENOSCOPY (EGD);  Surgeon: Toledo, 06/11/2020, MD;  Location: [facility] ENDOSCOPY;  Service: Gastroenterology;  Laterality: N/A;  . ESOPHAGOGASTRODUODENOSCOPY (EGD) WITH PROPOFOL N/A 12/20/2019   Procedure: ESOPHAGOGASTRODUODENOSCOPY (EGD) WITH PROPOFOL;  Surgeon: 12/22/2019, MD;  Location: [facility] ENDOSCOPY;  Service: Endoscopy;  Laterality: N/A;  . IVC FILTER INSERTION N/A 11/26/2019   Procedure: IVC FILTER INSERTION;  Surgeon: 11/28/2019, MD;  Location: [facility] INVASIVE CV LAB;  Service: Cardiovascular;  Laterality: N/A;  . JOINT REPLACEMENT    . KNEE ARTHROPLASTY Left  12/08/2019   Procedure: COMPUTER ASSISTED TOTAL KNEE ARTHROPLASTY;  Surgeon: 13/10/2019, MD;  Location: [facility] ORS;  Service: Orthopedics;  Laterality: Left;  . KNEE ARTHROPLASTY Right 05/01/2020   Procedure: COMPUTER ASSISTED TOTAL KNEE ARTHROPLASTY;  Surgeon: 07/01/2020, MD;  Location: [facility] ORS;  Service: Orthopedics;  Laterality: Right;  . PROSTATE BIOPSY      Social History Social History   Tobacco Use  . Smoking status: Former Smoker    Packs/day: 0.10    Types: Cigarettes  . Smokeless tobacco: Never Used  Vaping Use  . Vaping Use: Never used  Substance Use Topics  . Alcohol use: No  . Drug use: No    Family History Family History  Problem Relation Age of Onset  . Asthma Father   . Heart attack Father     Allergies  Allergen Reactions  . Zileuton Hives    [Onset: 10/27/2009]    . Roflumilast Other (See Comments)    insomnia   [Onset: 11/29/2010] Insomnia     REVIEW OF SYSTEMS (Negative unless checked)  Constitutional: [] Weight loss  [] Fever  [] Chills Cardiac: [] Chest pain   [] Chest pressure   [] Palpitations   [] Shortness of breath when laying flat   [] Shortness of breath with exertion. Vascular:  [] Pain in legs with walking   [] Pain in legs at rest  [] History of DVT   [] Phlebitis   [] Swelling in legs   [] Varicose veins   [] Non-healing ulcers Pulmonary:   [] Uses home oxygen   [] Productive cough   [] Hemoptysis   [] Wheeze  [] COPD   [] Asthma  Neurologic:  [] Dizziness   [] Seizures   [] History of stroke   [] History of TIA  [] Aphasia   [] Vissual changes   [] Weakness or numbness in arm   [] Weakness or numbness in leg Musculoskeletal:   [] Joint swelling   [x] Joint pain   [] Low back pain Hematologic:  [] Easy bruising  [] Easy bleeding   [x] Hypercoagulable state   [] Anemic Gastrointestinal:  [] Diarrhea   [] Vomiting  [x] Gastroesophageal reflux/heartburn   [] Difficulty swallowing. Genitourinary:  [] Chronic kidney disease   [] Difficult urination  [] Frequent urination    [] Blood in urine Skin:  [] Rashes   [] Ulcers  Psychological:  [] History of anxiety   []  History of major depression.  Physical Examination  There were no vitals filed for this visit. There is no height or weight on file to calculate BMI. Gen: WD/WN, NAD Head: [place]/AT, No temporalis wasting.  Ear/Nose/Throat: Hearing grossly intact, nares w/o erythema or drainage Eyes: PER, EOMI, sclera nonicteric.  Neck: Supple, no large masses.   Pulmonary:  Good air movement, no audible wheezing bilaterally, no use of accessory muscles.  Cardiac: RRR, no JVD Vascular:  Vessel Right Left  Radial Palpable Palpable  Gastrointestinal: Non-distended. No guarding/no peritoneal signs.  Musculoskeletal: M/S 5/5 throughout.  No deformity or atrophy.  Neurologic: CN 2-12 intact. Symmetrical.  Speech is fluent. Motor exam as listed above. Psychiatric: Judgment intact, Mood & affect appropriate for pt's clinical situation. Dermatologic: No rashes or ulcers noted.  No changes consistent with cellulitis.   CBC Lab Results  Component Value Date   WBC 5.3 06/09/2020   HGB 7.9 (L) 06/09/2020   HCT 26.1 (L) 06/09/2020   MCV 74.6 (L) 06/09/2020   PLT 155 06/09/2020    BMET    Component Value Date/Time   NA 136 06/08/2020 0349   NA 135 (L) 08/02/2013 0315   K 3.8 06/08/2020 0349   K 4.0 08/02/2013 0315   CL 106 06/08/2020 0349   CL 100 08/02/2013 0315   CO2 24 06/08/2020 0349   CO2 27 08/02/2013 0315   GLUCOSE 86 06/08/2020 0349   GLUCOSE 123 (H) 08/02/2013 0315   BUN 10 06/08/2020 0349   BUN 19 (H) 08/02/2013 0315   CREATININE 0.71 06/08/2020 0349   CREATININE 1.08 08/02/2013 0315   CALCIUM 8.6 (L) 06/08/2020 0349   CALCIUM 8.7 08/02/2013 0315   GFRNONAA >60 06/08/2020 0349   GFRNONAA >60 08/02/2013 0315   GFRAA >60 05/29/2019 0456   GFRAA >60 08/02/2013 0315   CrCl cannot be calculated (Unknown ideal weight.).  COAG Lab Results  Component Value Date   INR 1.4 (H) 06/08/2020   INR 1.9 (H)  06/07/2020   INR 1.2 04/20/2020    Radiology DG Chest 2 View  Result Date: 06/07/2020 CLINICAL DATA:  Shortness of breath for 4 days EXAM: CHEST - 2 VIEW COMPARISON:  05/06/2017 FINDINGS: Normal heart size. Aortic tortuosity. Linear scarring or atelectasis at the right base. There is no edema, consolidation, effusion, or pneumothorax. IMPRESSION: Mild scarring or atelectasis.  No edema or consolidation. Electronically Signed   By: 06/11/2020 M.D.   On: 06/07/2020 11:27     Assessment/Plan 1. History of pulmonary embolism I have discussed IVC filter removal.  Both the procedure as well as the risks and benefits.    At this time the patient wishes to have his filter removed.  Arrangements have been made and we will move forward with the procedure.   2. Primary osteoarthritis of both knees Plan per Dr. 08/08/2020  3.  Asthma with COPD (HCC) Continue pulmonary medications and aerosols as already ordered, these medications have been reviewed and there are no changes at this time.  4. Gastro-esophageal reflux disease with esophagitis, with bleeding Continue PPI as already ordered, this medication has been reviewed and there are no changes at this time.  Avoidence of caffeine and alcohol  Moderate elevation of the head of the bed    [name], MD  06/27/2020 2:09 PM

## 2020-06-28 ENCOUNTER — Encounter: Payer: Self-pay | Admitting: Vascular Surgery

## 2020-07-10 ENCOUNTER — Other Ambulatory Visit: Payer: Self-pay

## 2020-07-10 ENCOUNTER — Encounter (INDEPENDENT_AMBULATORY_CARE_PROVIDER_SITE_OTHER): Payer: Self-pay | Admitting: Vascular Surgery

## 2020-07-10 ENCOUNTER — Ambulatory Visit (INDEPENDENT_AMBULATORY_CARE_PROVIDER_SITE_OTHER): Payer: Medicare PPO | Admitting: Vascular Surgery

## 2020-07-10 VITALS — BP 120/70 | HR 99 | Ht 67.0 in | Wt 206.0 lb

## 2020-07-10 DIAGNOSIS — E7800 Pure hypercholesterolemia, unspecified: Secondary | ICD-10-CM | POA: Diagnosis not present

## 2020-07-10 DIAGNOSIS — Z86711 Personal history of pulmonary embolism: Secondary | ICD-10-CM | POA: Diagnosis not present

## 2020-07-10 DIAGNOSIS — J449 Chronic obstructive pulmonary disease, unspecified: Secondary | ICD-10-CM

## 2020-07-10 DIAGNOSIS — M170 Bilateral primary osteoarthritis of knee: Secondary | ICD-10-CM

## 2020-07-10 NOTE — Progress Notes (Signed)
MRN : [id number]  Kyle Wells is a 87 y.o. (03-25-1933) male who presents with chief complaint of  Chief Complaint  Patient presents with   Follow-up    [facility] 2-week F/U filter removal   .  History of Present Illness:   Patient returns s/p IVC filter removal.  He has no c/o denies neck or abdominal pain.  Current Meds  Medication Sig   acetaminophen (TYLENOL) 650 MG CR tablet Take 1,300 mg by mouth 2 (two) times daily.   ADVAIR DISKUS 500-50 MCG/DOSE AEPB Inhale 1 puff into the lungs 2 (two) times daily.    albuterol (PROVENTIL HFA;VENTOLIN HFA) 108 (90 Base) MCG/ACT inhaler Inhale 2 puffs into the lungs every 4 (four) hours as needed for wheezing or shortness of breath.   albuterol (PROVENTIL) (2.5 MG/3ML) 0.083% nebulizer solution Inhale 3 mLs into the lungs every 6 (six) hours as needed for wheezing or shortness of breath.    azelastine (ASTELIN) 0.1 % nasal spray Place 2 sprays into both nostrils daily as needed for allergies.    docusate sodium (COLACE) 100 MG capsule Take 100 mg by mouth daily in the afternoon.   ferrous sulfate 325 (65 FE) MG tablet Take 1 tablet (325 mg total) by mouth every other day.   finasteride (PROSCAR) 5 MG tablet Take 1 tablet (5 mg total) by mouth at bedtime.   fluticasone (FLONASE) 50 MCG/ACT nasal spray Place 1 spray into both nostrils 2 (two) times daily.   montelukast (SINGULAIR) 10 MG tablet Take 10 mg by mouth at bedtime.    Multiple Vitamin (MULTIVITAMIN) capsule Take 1 capsule by mouth daily.   omeprazole (PRILOSEC) 20 MG capsule Take 20 mg by mouth daily.   polyethylene glycol (MIRALAX / GLYCOLAX) 17 g packet Take 17 g by mouth at bedtime.   rivaroxaban (XARELTO) 20 MG TABS tablet Take 1 tablet (20 mg total) by mouth daily.   SPIRIVA HANDIHALER 18 MCG inhalation capsule Place 18 mcg into inhaler and inhale daily.   traMADol (ULTRAM) 50 MG tablet Take 50 mg by mouth every 4 (four) hours as needed.   traZODone (DESYREL) 50 MG tablet  Take 50 mg by mouth at bedtime.    Past Medical History:  Diagnosis Date   Arthritis    Asthma    BPH (benign prostatic hyperplasia)    COPD (chronic obstructive pulmonary disease) (HCC)    Hypercholesterolemia    Impingement syndrome of right shoulder    OSA (obstructive sleep apnea)    Small bowel obstruction (HCC)     Past Surgical History:  Procedure Laterality Date   COLONOSCOPY     COLONOSCOPY WITH PROPOFOL N/A 06/09/2020   Procedure: COLONOSCOPY WITH PROPOFOL;  Surgeon: Toledo, Boykin Nearing, MD;  Location: [facility] ENDOSCOPY;  Service: Gastroenterology;  Laterality: N/A;   ESOPHAGOGASTRODUODENOSCOPY N/A 06/09/2020   Procedure: ESOPHAGOGASTRODUODENOSCOPY (EGD);  Surgeon: Toledo, Boykin Nearing, MD;  Location: [facility] ENDOSCOPY;  Service: Gastroenterology;  Laterality: N/A;   ESOPHAGOGASTRODUODENOSCOPY (EGD) WITH PROPOFOL N/A 12/20/2019   Procedure: ESOPHAGOGASTRODUODENOSCOPY (EGD) WITH PROPOFOL;  Surgeon: Midge Minium, MD;  Location: [facility] ENDOSCOPY;  Service: Endoscopy;  Laterality: N/A;   IVC FILTER INSERTION N/A 11/26/2019   Procedure: IVC FILTER INSERTION;  Surgeon: Renford Dills, MD;  Location: [facility] INVASIVE CV LAB;  Service: Cardiovascular;  Laterality: N/A;   IVC FILTER REMOVAL N/A 06/27/2020   Procedure: IVC FILTER REMOVAL;  Surgeon: Renford Dills, MD;  Location: [facility] INVASIVE CV LAB;  Service: Cardiovascular;  Laterality: N/A;  JOINT REPLACEMENT     KNEE ARTHROPLASTY Left 12/08/2019   Procedure: COMPUTER ASSISTED TOTAL KNEE ARTHROPLASTY;  Surgeon: Donato Heinz, MD;  Location: [facility] ORS;  Service: Orthopedics;  Laterality: Left;   KNEE ARTHROPLASTY Right 05/01/2020   Procedure: COMPUTER ASSISTED TOTAL KNEE ARTHROPLASTY;  Surgeon: Donato Heinz, MD;  Location: [facility] ORS;  Service: Orthopedics;  Laterality: Right;   PROSTATE BIOPSY      Social History Social History   Tobacco Use   Smoking status: Former    Packs/day: 0.10    Pack years: 0.00    Types: Cigarettes    Smokeless tobacco: Never  Vaping Use   Vaping Use: Never used  Substance Use Topics   Alcohol use: No   Drug use: No    Family History Family History  Problem Relation Age of Onset   Asthma Father    Heart attack Father     Allergies  Allergen Reactions   Zileuton Hives    [Onset: 10/27/2009]     Roflumilast Other (See Comments)    insomnia   [Onset: 11/29/2010] Insomnia     REVIEW OF SYSTEMS (Negative unless checked)  Constitutional: [] Weight loss  [] Fever  [] Chills Cardiac: [] Chest pain   [] Chest pressure   [] Palpitations   [] Shortness of breath when laying flat   [] Shortness of breath with exertion. Vascular:  [] Pain in legs with walking   [] Pain in legs at rest  [x] History of DVT   [] Phlebitis   [x] Swelling in legs   [] Varicose veins   [] Non-healing ulcers Pulmonary:   [] Uses home oxygen   [] Productive cough   [] Hemoptysis   [] Wheeze  [] COPD   [] Asthma Neurologic:  [] Dizziness   [] Seizures   [] History of stroke   [] History of TIA  [] Aphasia   [] Vissual changes   [] Weakness or numbness in arm   [] Weakness or numbness in leg Musculoskeletal:   [] Joint swelling   [x] Joint pain   [] Low back pain Hematologic:  [] Easy bruising  [] Easy bleeding   [] Hypercoagulable state   [] Anemic Gastrointestinal:  [] Diarrhea   [] Vomiting  [] Gastroesophageal reflux/heartburn   [] Difficulty swallowing. Genitourinary:  [] Chronic kidney disease   [] Difficult urination  [] Frequent urination   [] Blood in urine Skin:  [] Rashes   [] Ulcers  Psychological:  [] History of anxiety   []  History of major depression.  Physical Examination  Vitals:   07/10/20 1433  BP: 120/70  Pulse: 99  Weight: 206 lb (93.4 kg)  Height: 5\' 7"  (1.702 m)   Body mass index is 32.26 kg/m. Gen: WD/WN, NAD Head: [place]/AT, No temporalis wasting.  Ear/Nose/Throat: Hearing grossly intact, nares w/o erythema or drainage Eyes: PER, EOMI, sclera nonicteric.  Neck: Supple, no large masses.   Pulmonary:  Good air movement, no  audible wheezing bilaterally, no use of accessory muscles.  Cardiac: RRR, no JVD Vascular:   Vessel Right Left  Radial Palpable Palpable  Gastrointestinal: Non-distended. No guarding/no peritoneal signs.  Musculoskeletal: M/S 5/5 throughout.  No deformity or atrophy.  Neurologic: CN 2-12 intact. Symmetrical.  Speech is fluent. Motor exam as listed above. Psychiatric: Judgment intact, Mood & affect appropriate for pt's clinical situation. Dermatologic: No rashes or ulcers noted.  No changes consistent with cellulitis. Lymph : No lichenification or skin changes of chronic lymphedema.  CBC Lab Results  Component Value Date   WBC 5.3 06/09/2020   HGB 7.9 (L) 06/09/2020   HCT 26.1 (L) 06/09/2020   MCV 74.6 (L) 06/09/2020   PLT 155 06/09/2020    BMET  Component Value Date/Time   NA 136 06/08/2020 0349   NA 135 (L) 08/02/2013 0315   K 3.8 06/08/2020 0349   K 4.0 08/02/2013 0315   CL 106 06/08/2020 0349   CL 100 08/02/2013 0315   CO2 24 06/08/2020 0349   CO2 27 08/02/2013 0315   GLUCOSE 86 06/08/2020 0349   GLUCOSE 123 (H) 08/02/2013 0315   BUN 10 06/08/2020 0349   BUN 19 (H) 08/02/2013 0315   CREATININE 0.71 06/08/2020 0349   CREATININE 1.08 08/02/2013 0315   CALCIUM 8.6 (L) 06/08/2020 0349   CALCIUM 8.7 08/02/2013 0315   GFRNONAA >60 06/08/2020 0349   GFRNONAA >60 08/02/2013 0315   GFRAA >60 05/29/2019 0456   GFRAA >60 08/02/2013 0315   CrCl cannot be calculated (Patient's most recent lab result is older than the maximum 21 days allowed.).  COAG Lab Results  Component Value Date   INR 1.4 (H) 06/08/2020   INR 1.9 (H) 06/07/2020   INR 1.2 04/20/2020    Radiology PERIPHERAL VASCULAR CATHETERIZATION  Result Date: 06/27/2020 See Op Note     Assessment/Plan 1. History of pulmonary embolus (PE) Recommend:   No surgery or intervention at this point in time.  IVC filter has been successfully removed.  The patient is on anticoagulation   Elevation was  stressed, use of a recliner was discussed.  I have had a long discussion with the patient regarding DVT and post phlebitic changes such as swelling and why it  causes symptoms such as pain.  The patient will wear graduated compression stockings class 1 (20-30 mmHg), beginning after three full days of anticoagulation, on a daily basis a prescription was given. The patient will  beginning wearing the stockings first thing in the morning and removing them in the evening. The patient is instructed specifically not to sleep in the stockings.  In addition, behavioral modification including elevation during the day and avoidance of prolonged dependency will be initiated.    The patient will continue anticoagulation for now as there have not been any problems or complications at this point.    2. Asthma with COPD (HCC) Continue pulmonary medications and aerosols as already ordered, these medications have been reviewed and there are no changes at this time.    3. Primary osteoarthritis of both knees Continue NSAID medications as already ordered, these medications have been reviewed and there are no changes at this time.  Continued activity and therapy was stressed.   4. Hypercholesteremia Continue statin as ordered and reviewed, no changes at this time     [name], MD  07/10/2020 3:18 PM

## 2020-07-13 ENCOUNTER — Encounter (INDEPENDENT_AMBULATORY_CARE_PROVIDER_SITE_OTHER): Payer: Self-pay | Admitting: Vascular Surgery

## 2020-11-02 ENCOUNTER — Other Ambulatory Visit: Payer: Self-pay | Admitting: Unknown Physician Specialty

## 2020-11-02 ENCOUNTER — Ambulatory Visit
Admission: RE | Admit: 2020-11-02 | Discharge: 2020-11-02 | Disposition: A | Discharge status: Home / Self Care | Payer: Medicare PPO | Source: Ambulatory Visit | Attending: Unknown Physician Specialty | Admitting: Unknown Physician Specialty

## 2020-11-02 ENCOUNTER — Ambulatory Visit
Admission: RE | Admit: 2020-11-02 | Discharge: 2020-11-02 | Disposition: A | Discharge status: Home / Self Care | Payer: Medicare PPO | Attending: Unknown Physician Specialty | Admitting: Unknown Physician Specialty

## 2020-11-02 ENCOUNTER — Other Ambulatory Visit: Payer: Self-pay

## 2020-11-02 DIAGNOSIS — J019 Acute sinusitis, unspecified: Secondary | ICD-10-CM

## 2021-09-21 ENCOUNTER — Ambulatory Visit: Payer: Medicare PPO | Admitting: Urology

## 2021-09-21 ENCOUNTER — Encounter: Payer: Self-pay | Admitting: Urology

## 2021-09-21 VITALS — BP 115/76 | HR 74 | Ht 67.0 in | Wt 200.0 lb

## 2021-09-21 DIAGNOSIS — N401 Enlarged prostate with lower urinary tract symptoms: Secondary | ICD-10-CM

## 2021-09-21 DIAGNOSIS — R8281 Pyuria: Secondary | ICD-10-CM

## 2021-09-21 DIAGNOSIS — R350 Frequency of micturition: Secondary | ICD-10-CM | POA: Diagnosis not present

## 2021-09-21 LAB — URINALYSIS, COMPLETE
Bilirubin, UA: NEGATIVE
Glucose, UA: NEGATIVE
Ketones, UA: NEGATIVE
Nitrite, UA: NEGATIVE
Protein,UA: NEGATIVE
RBC, UA: NEGATIVE
Specific Gravity, UA: 1.02 (ref 1.005–1.030)
Urobilinogen, Ur: 0.2 mg/dL (ref 0.2–1.0)
pH, UA: 5.5 (ref 5.0–7.5)

## 2021-09-21 LAB — MICROSCOPIC EXAMINATION: WBC, UA: >30 /hpf — AB (ref 0–5)

## 2021-09-21 LAB — BLADDER SCAN AMB NON-IMAGING: Scan Result: 24

## 2021-09-21 NOTE — Progress Notes (Signed)
09/21/2021 10:50 AM   Kyle Wells [birth year] [id number]  Referring provider: Lauro Regulus, MD [address] [facility] [place] [facility] [place],  [place] [address]  Chief Complaint  Patient presents with   Urinary Frequency    HPI: Kyle Wells is a 88 y.o. male referred for evaluation of urinary frequency  States today he thinks he urinates too frequently but is not sure Estimates he voids no more than 3-4 times per day and has no nocturia Has occasional urgency but if he gets the urge while driving home in the car and he is able to wait until he gets home to void Is on finasteride 5 mg daily IPSS completed today 8/35 with QoL rated 1/6 Denies dysuria, gross hematuria No flank, abdominal or pelvic pain History of an elevated PSA and he underwent a biopsy by Dr. Evelene Croon which he states many years ago and states he would not have a repeat biopsy.  Last PSA in 2018 was 2.95   PMH: Past Medical History:  Diagnosis Date   Arthritis    Asthma    BPH (benign prostatic hyperplasia)    COPD (chronic obstructive pulmonary disease) (HCC)    Hypercholesterolemia    Impingement syndrome of right shoulder    OSA (obstructive sleep apnea)    Small bowel obstruction [facility])     Surgical History: Past Surgical History:  Procedure Laterality Date   COLONOSCOPY     COLONOSCOPY WITH PROPOFOL N/A 06/09/2020   Procedure: COLONOSCOPY WITH PROPOFOL;  Surgeon: Toledo, Boykin Nearing, MD;  Location: [facility] ENDOSCOPY;  Service: Gastroenterology;  Laterality: N/A;   ESOPHAGOGASTRODUODENOSCOPY N/A 06/09/2020   Procedure: ESOPHAGOGASTRODUODENOSCOPY (EGD);  Surgeon: Toledo, Boykin Nearing, MD;  Location: [facility] ENDOSCOPY;  Service: Gastroenterology;  Laterality: N/A;   ESOPHAGOGASTRODUODENOSCOPY (EGD) WITH PROPOFOL N/A 12/20/2019   Procedure: ESOPHAGOGASTRODUODENOSCOPY (EGD) WITH PROPOFOL;  Surgeon: Midge Minium, MD;  Location: [facility] ENDOSCOPY;  Service: Endoscopy;  Laterality: N/A;   IVC FILTER  INSERTION N/A 11/26/2019   Procedure: IVC FILTER INSERTION;  Surgeon: Renford Dills, MD;  Location: [facility] INVASIVE CV LAB;  Service: Cardiovascular;  Laterality: N/A;   IVC FILTER REMOVAL N/A 06/27/2020   Procedure: IVC FILTER REMOVAL;  Surgeon: Renford Dills, MD;  Location: [facility] INVASIVE CV LAB;  Service: Cardiovascular;  Laterality: N/A;   JOINT REPLACEMENT     KNEE ARTHROPLASTY Left 12/08/2019   Procedure: COMPUTER ASSISTED TOTAL KNEE ARTHROPLASTY;  Surgeon: Donato Heinz, MD;  Location: [facility] ORS;  Service: Orthopedics;  Laterality: Left;   KNEE ARTHROPLASTY Right 05/01/2020   Procedure: COMPUTER ASSISTED TOTAL KNEE ARTHROPLASTY;  Surgeon: Donato Heinz, MD;  Location: [facility] ORS;  Service: Orthopedics;  Laterality: Right;   PROSTATE BIOPSY      Home Medications:  Allergies as of 09/21/2021       Reactions   Zileuton Hives   [Onset: 10/27/2009]   Roflumilast Other (See Comments)   insomnia   [Onset: 11/29/2010] Insomnia        Medication List        Accurate as of September 21, 2021 10:50 AM. If you have any questions, ask your nurse or doctor.          acetaminophen 650 MG CR tablet Commonly known as: TYLENOL Take 1,300 mg by mouth 2 (two) times daily.   Advair Diskus 500-50 MCG/ACT Aepb Generic drug: fluticasone-salmeterol Inhale 1 puff into the lungs 2 (two) times daily.   albuterol 108 (90 Base) MCG/ACT inhaler Commonly known as:  VENTOLIN HFA Inhale 2 puffs into the lungs every 4 (four) hours as needed for wheezing or shortness of breath.   albuterol (2.5 MG/3ML) 0.083% nebulizer solution Commonly known as: PROVENTIL Inhale 3 mLs into the lungs every 6 (six) hours as needed for wheezing or shortness of breath.   azelastine 0.1 % nasal spray Commonly known as: ASTELIN Place 2 sprays into both nostrils daily as needed for allergies.   docusate sodium 100 MG capsule Commonly known as: COLACE Take 100 mg by mouth daily in the afternoon.   ferrous  sulfate 325 (65 FE) MG tablet Take 1 tablet (325 mg total) by mouth every other day.   finasteride 5 MG tablet Commonly known as: PROSCAR Take 1 tablet (5 mg total) by mouth at bedtime.   fluticasone 50 MCG/ACT nasal spray Commonly known as: FLONASE Place 1 spray into both nostrils 2 (two) times daily.   montelukast 10 MG tablet Commonly known as: SINGULAIR Take 10 mg by mouth at bedtime.   multivitamin capsule Take 1 capsule by mouth daily.   omeprazole 20 MG capsule Commonly known as: PRILOSEC Take 20 mg by mouth daily.   pantoprazole 40 MG tablet Commonly known as: Protonix Take 1 tablet (40 mg total) by mouth 2 (two) times daily.   polyethylene glycol 17 g packet Commonly known as: MIRALAX / GLYCOLAX Take 17 g by mouth at bedtime.   rivaroxaban 20 MG Tabs tablet Commonly known as: Xarelto Take 1 tablet (20 mg total) by mouth daily.   Spiriva HandiHaler 18 MCG inhalation capsule Generic drug: tiotropium Place 18 mcg into inhaler and inhale daily.   traMADol 50 MG tablet Commonly known as: ULTRAM Take 50 mg by mouth every 4 (four) hours as needed.   traZODone 50 MG tablet Commonly known as: DESYREL Take 50 mg by mouth at bedtime.        Allergies:  Allergies  Allergen Reactions   Zileuton Hives    [Onset: 10/27/2009]     Roflumilast Other (See Comments)    insomnia   [Onset: 11/29/2010] Insomnia    Family History: Family History  Problem Relation Age of Onset   Asthma Father    Heart attack Father     Social History:  reports that he has quit smoking. His smoking use included cigarettes. He smoked an average of .1 packs per day. He has never used smokeless tobacco. He reports that he does not drink alcohol and does not use drugs.   Physical Exam: BP 115/76   Pulse 74   Ht 5\' 7"  (1.702 m)   Wt 200 lb (90.7 kg)   BMI 31.32 kg/m   Constitutional:  Alert and oriented, No acute distress. HEENT: [place] AT Respiratory: Normal respiratory effort,  no increased work of breathing. Psychiatric: Normal mood and affect.  Laboratory Data:  Urinalysis Dipstick 1+ leukocytes Microscopy >30 WBC   Assessment & Plan:    1. Urinary frequency Mild lower urinary tract symptoms which are not bothersome He declines any additional treatment PVR today was 24 mL  2.  Pyuria UA today with >30 WBC-asymptomatic UA with PCP 08/27/2021 was unremarkable and urine culture grew mixed flora Repeat urine culture ordered   08/29/2021, MD  [facility] [address], [address] [place], [place] [id number]

## 2021-09-26 LAB — CULTURE, URINE COMPREHENSIVE

## 2021-10-25 ENCOUNTER — Other Ambulatory Visit: Payer: Self-pay | Admitting: Specialist

## 2021-10-25 DIAGNOSIS — R911 Solitary pulmonary nodule: Secondary | ICD-10-CM

## 2021-11-09 ENCOUNTER — Ambulatory Visit
Admission: RE | Admit: 2021-11-09 | Discharge: 2021-11-09 | Disposition: A | Discharge status: Home / Self Care | Payer: Medicare PPO | Source: Ambulatory Visit | Attending: Specialist | Admitting: Specialist

## 2021-11-09 DIAGNOSIS — R911 Solitary pulmonary nodule: Secondary | ICD-10-CM

## 2021-11-26 ENCOUNTER — Encounter (INDEPENDENT_AMBULATORY_CARE_PROVIDER_SITE_OTHER): Payer: Self-pay

## 2022-05-01 ENCOUNTER — Other Ambulatory Visit: Payer: Self-pay | Admitting: Specialist

## 2022-05-01 DIAGNOSIS — R911 Solitary pulmonary nodule: Secondary | ICD-10-CM

## 2022-05-14 ENCOUNTER — Ambulatory Visit
Admission: RE | Admit: 2022-05-14 | Discharge: 2022-05-14 | Disposition: A | Discharge status: Home / Self Care | Payer: Medicare PPO | Source: Ambulatory Visit | Attending: Specialist | Admitting: Specialist

## 2022-05-14 DIAGNOSIS — R911 Solitary pulmonary nodule: Secondary | ICD-10-CM | POA: Diagnosis present

## 2023-03-30 ENCOUNTER — Ambulatory Visit (INDEPENDENT_AMBULATORY_CARE_PROVIDER_SITE_OTHER)

## 2023-03-30 ENCOUNTER — Ambulatory Visit
Admission: EM | Admit: 2023-03-30 | Discharge: 2023-03-30 | Disposition: A | Discharge status: Home / Self Care | Attending: Physician Assistant | Admitting: Physician Assistant

## 2023-03-30 DIAGNOSIS — R051 Acute cough: Secondary | ICD-10-CM | POA: Diagnosis not present

## 2023-03-30 DIAGNOSIS — R0602 Shortness of breath: Secondary | ICD-10-CM | POA: Insufficient documentation

## 2023-03-30 DIAGNOSIS — R5383 Other fatigue: Secondary | ICD-10-CM | POA: Diagnosis present

## 2023-03-30 DIAGNOSIS — J441 Chronic obstructive pulmonary disease with (acute) exacerbation: Secondary | ICD-10-CM | POA: Insufficient documentation

## 2023-03-30 DIAGNOSIS — J101 Influenza due to other identified influenza virus with other respiratory manifestations: Secondary | ICD-10-CM | POA: Diagnosis not present

## 2023-03-30 LAB — RESP PANEL BY RT-PCR (RSV, FLU A&B, COVID)  RVPGX2
Influenza A by PCR: POSITIVE — AB
Influenza B by PCR: NEGATIVE
Resp Syncytial Virus by PCR: NEGATIVE
SARS Coronavirus 2 by RT PCR: NEGATIVE

## 2023-03-30 MED ORDER — BENZONATATE 200 MG PO CAPS
200.0000 mg | ORAL_CAPSULE | Freq: Three times a day (TID) | ORAL | 0 refills | Status: AC | PRN
Start: 2023-03-30 — End: ?

## 2023-03-30 MED ORDER — OSELTAMIVIR PHOSPHATE 75 MG PO CAPS: 75.0000 mg | ORAL_CAPSULE | Freq: Two times a day (BID) | ORAL | 0 refills | Status: AC

## 2023-03-30 MED ORDER — PREDNISONE 20 MG PO TABS
40.0000 mg | ORAL_TABLET | Freq: Every day | ORAL | 0 refills | Status: AC
Start: 2023-03-30 — End: 2023-04-04

## 2023-03-30 MED ORDER — IPRATROPIUM-ALBUTEROL 0.5-2.5 (3) MG/3ML IN SOLN
3.0000 mL | Freq: Once | RESPIRATORY_TRACT | Status: AC
  Administered 2023-03-30: 3 mL via RESPIRATORY_TRACT

## 2023-03-30 NOTE — ED Triage Notes (Signed)
 Pt is with his son  Pt c/o chest congestion x1day  Pt used 2 puffs of his inahler and is on 3L oxygen  Pt is worried about the flu  Pt states that he does not know his medications and states that he is not on any blood thinners or blood pressure medications.

## 2023-03-30 NOTE — ED Provider Notes (Signed)
 [facility]    CSN: [id number] Arrival date & time: 03/30/23  0945      History   Chief Complaint No chief complaint on file.   HPI Kyle Wells is a 89 y.o. male with history of asthma, COPD, hyperlipidemia, obesity, and obstructive sleep apnea presents for approximately 2 day history of shortness of breath, fatigue, cough and congestion.  Patient has been using albuterol inhaler.  He is also on 3 L of supplemental oxygen.  Patient says he normally uses oxygen in the morning and in the evening and sometimes when he gets up and moves around.  He has had to use it a little more frequently over the past 2 days.  He says his normal oxygen saturation is around 94%.  Patient thinks he may have the flu.  He denies fever, headaches, body aches, sore throat, chest pain, vomiting or diarrhea.  No sick contacts or known exposure to flu.  Of note, patient uses albuterol, Spiriva, Advair discus and also takes Singulair, cetirizine and Flonase. Patient is here with his son.  HPI  Past Medical History:  Diagnosis Date   Arthritis    Asthma    BPH (benign prostatic hyperplasia)    COPD (chronic obstructive pulmonary disease) (HCC)    Hypercholesterolemia    Impingement syndrome of right shoulder    OSA (obstructive sleep apnea)    Small bowel obstruction (HCC)     Patient Active Problem List   Diagnosis Date Noted   Symptomatic anemia 06/07/2020   GI bleed 06/07/2020   Total knee replacement status 05/01/2020   Anticoagulated    Anemia 12/20/2019   Hematemesis 12/20/2019   Tobacco abuse 12/20/2019   Gastro-esophageal reflux disease with esophagitis, with bleeding    Status post total left knee replacement 12/08/2019   History of pulmonary embolism 06/14/2019   History of pulmonary embolus (PE) 06/14/2019   Intractable nausea and vomiting 05/28/2019   Primary osteoarthritis of both knees 11/03/2017   SBO (small bowel obstruction) (HCC) 05/07/2017   Small bowel  obstruction (HCC) 12/10/2016   Healthcare maintenance 12/02/2016   Nausea    Obesity, unspecified 09/26/2016   Obstructive sleep apnea on CPAP 11/02/2015   Chronic obstructive pulmonary disease (HCC)    Intestinal obstruction (HCC)    Ileus (HCC) 09/19/2014   Impingement syndrome of shoulder, right 05/13/2014   Benign localized hyperplasia of prostate with urinary obstruction and other lower urinary tract symptoms (LUTS)(600.21) 03/30/2014   Hypercholesteremia 03/30/2014   Sleep apnea 03/03/2012   Benign localized prostatic hyperplasia with lower urinary tract symptoms (LUTS) 10/16/2011   Elevated prostate specific antigen (PSA) 10/16/2011   BPH (benign prostatic hyperplasia) 10/16/2011   Incomplete emptying of bladder 10/16/2011   Frequency of micturition 10/16/2011   Retention of urine 10/16/2011   Asthma with COPD (HCC) 05/31/2011   Chronic obstructive asthma (HCC) 05/31/2011   House dust mite allergy 10/20/2010   Other allergy, other than to medicinal agents 10/20/2010    Past Surgical History:  Procedure Laterality Date   COLONOSCOPY     COLONOSCOPY WITH PROPOFOL N/A 06/09/2020   Procedure: COLONOSCOPY WITH PROPOFOL;  Surgeon: Toledo, Boykin Nearing, MD;  Location: [facility];  Service: Gastroenterology;  Laterality: N/A;   ESOPHAGOGASTRODUODENOSCOPY N/A 06/09/2020   Procedure: ESOPHAGOGASTRODUODENOSCOPY (EGD);  Surgeon: Toledo, Boykin Nearing, MD;  Location: [facility];  Service: Gastroenterology;  Laterality: N/A;   ESOPHAGOGASTRODUODENOSCOPY (EGD) WITH PROPOFOL N/A 12/20/2019   Procedure: ESOPHAGOGASTRODUODENOSCOPY (EGD) WITH PROPOFOL;  Surgeon: Midge Minium, MD;  Location: [facility];  Service: Endoscopy;  Laterality: N/A;   IVC FILTER INSERTION N/A 11/26/2019   Procedure: IVC FILTER INSERTION;  Surgeon: Renford Dills, MD;  Location: [facility];  Service: Cardiovascular;  Laterality: N/A;   IVC FILTER REMOVAL N/A 06/27/2020   Procedure: IVC FILTER REMOVAL;   Surgeon: Renford Dills, MD;  Location: [facility];  Service: Cardiovascular;  Laterality: N/A;   JOINT REPLACEMENT     KNEE ARTHROPLASTY Left 12/08/2019   Procedure: COMPUTER ASSISTED TOTAL KNEE ARTHROPLASTY;  Surgeon: Donato Heinz, MD;  Location: [facility];  Service: Orthopedics;  Laterality: Left;   KNEE ARTHROPLASTY Right 05/01/2020   Procedure: COMPUTER ASSISTED TOTAL KNEE ARTHROPLASTY;  Surgeon: Donato Heinz, MD;  Location: [facility];  Service: Orthopedics;  Laterality: Right;   PROSTATE BIOPSY         Home Medications    Prior to Admission medications   Medication Sig Start Date End Date Taking? Authorizing Provider  benzonatate (TESSALON) 200 MG capsule Take 1 capsule (200 mg total) by mouth 3 (three) times daily as needed for cough. 03/30/23  Yes Shirlee Latch, PA-C  oseltamivir (TAMIFLU) 75 MG capsule Take 1 capsule (75 mg total) by mouth every 12 (twelve) hours for 5 days. 03/30/23 04/04/23 Yes Shirlee Latch, PA-C  predniSONE (DELTASONE) 20 MG tablet Take 2 tablets (40 mg total) by mouth daily for 5 days. 03/30/23 04/04/23 Yes Shirlee Latch, PA-C  acetaminophen (TYLENOL) 650 MG CR tablet Take 1,300 mg by mouth 2 (two) times daily.    [provider]  ADVAIR DISKUS 500-50 MCG/DOSE AEPB Inhale 1 puff into the lungs 2 (two) times daily.  09/12/14   [provider]  albuterol (PROVENTIL HFA;VENTOLIN HFA) 108 (90 Base) MCG/ACT inhaler Inhale 2 puffs into the lungs every 4 (four) hours as needed for wheezing or shortness of breath.    [provider]  albuterol (PROVENTIL) (2.5 MG/3ML) 0.083% nebulizer solution Inhale 3 mLs into the lungs every 6 (six) hours as needed for wheezing or shortness of breath.  02/21/14   [provider]  azelastine (ASTELIN) 0.1 % nasal spray Place 2 sprays into both nostrils daily as needed for allergies.  05/21/19   [provider]  docusate sodium (COLACE) 100 MG capsule Take 100 mg by mouth daily in  the afternoon.    [provider]  ferrous sulfate 325 (65 FE) MG tablet Take 1 tablet (325 mg total) by mouth every other day. 06/09/20 08/08/20  Lurene Shadow, MD  finasteride (PROSCAR) 5 MG tablet Take 1 tablet (5 mg total) by mouth at bedtime. 06/09/20   Lurene Shadow, MD  fluticasone (FLONASE) 50 MCG/ACT nasal spray Place 1 spray into both nostrils 2 (two) times daily. 06/23/20   [provider]  montelukast (SINGULAIR) 10 MG tablet Take 10 mg by mouth at bedtime.  09/12/14   [provider]  Multiple Vitamin (MULTIVITAMIN) capsule Take 1 capsule by mouth daily.    [provider]  omeprazole (PRILOSEC) 20 MG capsule Take 20 mg by mouth daily.    [provider]  pantoprazole (PROTONIX) 40 MG tablet Take 1 tablet (40 mg total) by mouth 2 (two) times daily. 12/21/19 01/20/20  Delfino Lovett, MD  polyethylene glycol (MIRALAX / GLYCOLAX) 17 g packet Take 17 g by mouth at bedtime. 06/09/20   Lurene Shadow, MD  SPIRIVA HANDIHALER 18 MCG inhalation capsule Place 18 mcg into inhaler and inhale daily. 09/13/14  [provider]  traMADol (ULTRAM) 50 MG tablet Take 50 mg by mouth every 4 (four) hours as needed. 05/12/20   [provider]  traZODone (DESYREL) 150 MG tablet Take 150 mg by mouth at bedtime. 02/08/20   [provider]    Family History Family History  Problem Relation Age of Onset   Asthma Father    Heart attack Father     Social History Social History   Tobacco Use   Smoking status: Former    Current packs/day: 0.10    Types: Cigarettes   Smokeless tobacco: Never  Vaping Use   Vaping status: Never Used  Substance Use Topics   Alcohol use: No   Drug use: No     Allergies   Zileuton and Roflumilast   Review of Systems Review of Systems  Constitutional:  Positive for fatigue. Negative for fever.  HENT:  Positive for congestion and rhinorrhea. Negative for sinus pressure, sinus pain and sore throat.    Respiratory:  Positive for cough and shortness of breath.   Cardiovascular:  Negative for chest pain.  Gastrointestinal:  Negative for abdominal pain, diarrhea, nausea and vomiting.  Musculoskeletal:  Negative for myalgias.  Neurological:  Negative for weakness, light-headedness and headaches.  Hematological:  Negative for adenopathy.     Physical Exam Triage Vital Signs ED Triage Vitals  Encounter Vitals Group     BP 03/30/23 1147 (P) 108/72     Systolic BP Percentile --      Diastolic BP Percentile --      Pulse Rate 03/30/23 1143 92     Resp --      Temp 03/30/23 1147 98.5 F (36.9 C)     Temp Source 03/30/23 1147 Oral     SpO2 03/30/23 1143 92 %     Weight 03/30/23 1145 200 lb (90.7 kg)     Height 03/30/23 1145 5\' 6"  (1.676 m)     Head Circumference --      Peak Flow --      Pain Score 03/30/23 1144 0     Pain Loc --      Pain Education --      Exclude from Growth Chart --    No data found.  Updated Vital Signs BP (P) 108/72 (BP Location: Left Arm)   Pulse 91   Temp 98.5 F (36.9 C) (Oral)   Ht 5\' 6"  (1.676 m)   Wt 200 lb (90.7 kg)   SpO2 93%   BMI 32.28 kg/m      Physical Exam Vitals and nursing note reviewed.  Constitutional:      General: He is not in acute distress.    Appearance: Normal appearance. He is well-developed. He is ill-appearing.     Comments: Patient on 3L supplemental O2  HENT:     Head: Normocephalic and atraumatic.     Nose: Congestion present.     Mouth/Throat:     Mouth: Mucous membranes are moist.     Pharynx: Oropharynx is clear.  Eyes:     General: No scleral icterus.    Conjunctiva/sclera: Conjunctivae normal.  Cardiovascular:     Rate and Rhythm: Normal rate and regular rhythm.     Heart sounds: Normal heart sounds.  Pulmonary:     Effort: Pulmonary effort is normal. No respiratory distress.     Breath sounds: Wheezing (Scattered wheezes and reduced air flow throughout) present.  Musculoskeletal:     Cervical back:  Neck supple.  Skin:  General: Skin is warm and dry.     Capillary Refill: Capillary refill takes less than 2 seconds.  Neurological:     General: No focal deficit present.     Mental Status: He is alert. Mental status is at baseline.     Motor: No weakness.     Gait: Gait normal.  Psychiatric:        Mood and Affect: Mood normal.        Behavior: Behavior normal.      UC Treatments / Results  Labs (all labs ordered are listed, but only abnormal results are displayed) Labs Reviewed  RESP PANEL BY RT-PCR (RSV, FLU A&B, COVID)  RVPGX2 - Abnormal; Notable for the following components:      Result Value   Influenza A by PCR POSITIVE (*)    All other components within normal limits    EKG   Radiology DG Chest 2 View Result Date: 03/30/2023 CLINICAL DATA:  Cough. Congestion. COPD. Shortness of breath for 1 day. EXAM: CHEST - 2 VIEW COMPARISON:  06/07/2020.  Chest CT 05/14/2022 FINDINGS: Hyperinflation. Lateral view degraded by patient arm position. Midline trachea. Mild cardiomegaly. No pleural effusion or pneumothorax. Left-greater-than-right base linear opacities, favoring scarring. These are similar. IMPRESSION: Cardiomegaly without congestive failure. Electronically Signed   By: Jeronimo Greaves M.D.   On: 03/30/2023 12:22    Procedures Procedures (including critical care time)  Medications Ordered in UC Medications  ipratropium-albuterol (DUONEB) 0.5-2.5 (3) MG/3ML nebulizer solution 3 mL (3 mLs Nebulization Given 03/30/23 1226)    Initial Impression / Assessment and Plan / UC Course  I have reviewed the triage vital signs and the nursing notes.  Pertinent labs & imaging results that were available during my care of the patient were reviewed by me and considered in my medical decision making (see chart for details).   89 year old male with history of asthma, COPD, obstructive sleep apnea and obesity presents for onset of fatigue, shortness of breath, cough and congestion 2  days ago.  Denies fever, sore throat, chest pain, abdominal pain, vomiting or diarrhea.  He would like to be tested for the flu.  Patient is on Spiriva, Advair diskus, albuterol, cetirizine, and Singulair.  Vitals are all stable.  Oxygen saturation 93% on 3 L supplemental oxygen.  No distress.  States he uses oxygen in the morning and evening.  Oxygen saturations normal at 94%.  He has mild nasal congestion and is ill-appearing but nontoxic.  Scattered wheezes and reduced airflow throughout.  Respiratory panel and chest x-ray obtained.  Patient given DuoNeb.  Positive influenza A.  History negative for pneumonia.  Chest reviewed result with patient and his son.  Reviewed current CDC guidelines, isolation protocol and ED precautions for flu.  Sent Tamiflu to pharmacy.  Also covering COPD exacerbation with prednisone.  Sent benzonatate.  Advised to continue with breathing treatments and inhalers at home and close eye on his oxygen saturation.  Encouraged him to go to the ER if below 90% or breathing is worsening.  Patient is understanding and agreeable to plan.  Son also verbalizes understanding.   Final Clinical Impressions(s) / UC Diagnoses   Final diagnoses:  Acute cough  Influenza A  COPD exacerbation (HCC)  Shortness of breath  Other fatigue     Discharge Instructions      - Flu is positive.  You are within the window for treatment Tamiflu to potentially be helpful so I sent it to the pharmacy if you are positive. -  X-ray does not show pneumonia. - Sent cough medicine and Tamiflu. -Make sure you are using your inhalers and breathing treatments at home.  If breathing is worsening you should go to the emergency department. - You need to isolate until you are fever free for 24 hours and symptoms are improving. - Increase rest and fluids. - You should be seen again if you have uncontrolled fever, weakness or worsening breathing problem.     ED Prescriptions     Medication Sig  Dispense Auth. Provider   oseltamivir (TAMIFLU) 75 MG capsule Take 1 capsule (75 mg total) by mouth every 12 (twelve) hours for 5 days. 10 capsule Eusebio Friendly B, PA-C   predniSONE (DELTASONE) 20 MG tablet Take 2 tablets (40 mg total) by mouth daily for 5 days. 10 tablet Eusebio Friendly B, PA-C   benzonatate (TESSALON) 200 MG capsule Take 1 capsule (200 mg total) by mouth 3 (three) times daily as needed for cough. 30 capsule Shirlee Latch, PA-C      PDMP not reviewed this encounter.   Shirlee Latch, PA-C 03/30/23 1258

## 2023-03-30 NOTE — Discharge Instructions (Addendum)
-   Flu is positive.  You are within the window for treatment Tamiflu to potentially be helpful so I sent it to the pharmacy if you are positive. -X-ray does not show pneumonia. - Sent cough medicine and Tamiflu. -Make sure you are using your inhalers and breathing treatments at home.  If breathing is worsening you should go to the emergency department. - You need to isolate until you are fever free for 24 hours and symptoms are improving. - Increase rest and fluids. - You should be seen again if you have uncontrolled fever, weakness or worsening breathing problem.
